# Patient Record
Sex: Female | Born: 1975 | Race: Black or African American | Hispanic: No | Marital: Single | Smoking: Current every day smoker
Health system: Southern US, Community
[De-identification: ages and names within clinical notes are randomized; demographics above are authoritative.]

## PROBLEM LIST (undated history)

## (undated) DIAGNOSIS — I519 Heart disease, unspecified: Secondary | ICD-10-CM

## (undated) DIAGNOSIS — G4700 Insomnia, unspecified: Secondary | ICD-10-CM

## (undated) DIAGNOSIS — J9690 Respiratory failure, unspecified, unspecified whether with hypoxia or hypercapnia: Secondary | ICD-10-CM

## (undated) DIAGNOSIS — H539 Unspecified visual disturbance: Secondary | ICD-10-CM

## (undated) DIAGNOSIS — R011 Cardiac murmur, unspecified: Secondary | ICD-10-CM

## (undated) DIAGNOSIS — F329 Major depressive disorder, single episode, unspecified: Secondary | ICD-10-CM

## (undated) DIAGNOSIS — F32A Depression, unspecified: Secondary | ICD-10-CM

## (undated) DIAGNOSIS — I10 Essential (primary) hypertension: Secondary | ICD-10-CM

## (undated) DIAGNOSIS — D509 Iron deficiency anemia, unspecified: Secondary | ICD-10-CM

## (undated) HISTORY — DX: Unspecified visual disturbance: H53.9

## (undated) HISTORY — DX: Essential (primary) hypertension: I10

## (undated) HISTORY — DX: Cardiac murmur, unspecified: R01.1

## (undated) HISTORY — DX: Depression, unspecified: F32.A

## (undated) HISTORY — DX: Major depressive disorder, single episode, unspecified: F32.9

## (undated) HISTORY — DX: Heart disease, unspecified: I51.9

## (undated) HISTORY — DX: Iron deficiency anemia, unspecified: D50.9

## (undated) HISTORY — DX: Respiratory failure, unspecified, unspecified whether with hypoxia or hypercapnia: J96.90

---

## 1995-01-09 HISTORY — PX: DILATION AND CURETTAGE OF UTERUS: SHX78

## 1997-02-22 ENCOUNTER — Inpatient Hospital Stay (HOSPITAL_COMMUNITY): Admission: AD | Admit: 1997-02-22 | Discharge: 1997-02-22 | Payer: Self-pay | Admitting: Obstetrics

## 1997-03-10 ENCOUNTER — Inpatient Hospital Stay (HOSPITAL_COMMUNITY): Admission: AD | Admit: 1997-03-10 | Discharge: 1997-03-14 | Payer: Self-pay | Admitting: Obstetrics

## 1999-02-02 ENCOUNTER — Inpatient Hospital Stay (HOSPITAL_COMMUNITY): Admission: AD | Admit: 1999-02-02 | Discharge: 1999-02-04 | Payer: Self-pay | Admitting: *Deleted

## 1999-02-28 ENCOUNTER — Ambulatory Visit (HOSPITAL_COMMUNITY): Admission: RE | Admit: 1999-02-28 | Discharge: 1999-02-28 | Payer: Self-pay | Admitting: *Deleted

## 1999-02-28 ENCOUNTER — Encounter: Payer: Self-pay | Admitting: *Deleted

## 1999-06-13 ENCOUNTER — Inpatient Hospital Stay (HOSPITAL_COMMUNITY): Admission: AD | Admit: 1999-06-13 | Discharge: 1999-06-15 | Payer: Self-pay | Admitting: *Deleted

## 1999-12-03 ENCOUNTER — Inpatient Hospital Stay (HOSPITAL_COMMUNITY): Admission: AD | Admit: 1999-12-03 | Discharge: 1999-12-03 | Payer: Self-pay | Admitting: *Deleted

## 2001-08-21 ENCOUNTER — Encounter: Admission: RE | Admit: 2001-08-21 | Discharge: 2001-08-21 | Payer: Self-pay | Admitting: *Deleted

## 2001-08-27 ENCOUNTER — Encounter: Admission: RE | Admit: 2001-08-27 | Discharge: 2001-08-27 | Payer: Self-pay | Admitting: *Deleted

## 2001-09-03 ENCOUNTER — Encounter: Admission: RE | Admit: 2001-09-03 | Discharge: 2001-09-03 | Payer: Self-pay | Admitting: *Deleted

## 2001-09-17 ENCOUNTER — Encounter: Admission: RE | Admit: 2001-09-17 | Discharge: 2001-09-17 | Payer: Self-pay | Admitting: *Deleted

## 2001-09-22 ENCOUNTER — Ambulatory Visit (HOSPITAL_COMMUNITY): Admission: RE | Admit: 2001-09-22 | Discharge: 2001-09-22 | Payer: Self-pay | Admitting: *Deleted

## 2001-10-08 ENCOUNTER — Encounter: Admission: RE | Admit: 2001-10-08 | Discharge: 2001-10-08 | Payer: Self-pay | Admitting: *Deleted

## 2001-10-22 ENCOUNTER — Encounter: Admission: RE | Admit: 2001-10-22 | Discharge: 2001-10-22 | Payer: Self-pay | Admitting: *Deleted

## 2001-11-19 ENCOUNTER — Inpatient Hospital Stay (HOSPITAL_COMMUNITY): Admission: AD | Admit: 2001-11-19 | Discharge: 2001-11-19 | Payer: Self-pay | Admitting: *Deleted

## 2001-11-20 ENCOUNTER — Inpatient Hospital Stay (HOSPITAL_COMMUNITY): Admission: AD | Admit: 2001-11-20 | Discharge: 2001-11-22 | Payer: Self-pay | Admitting: Family Medicine

## 2001-12-24 ENCOUNTER — Emergency Department (HOSPITAL_COMMUNITY): Admission: EM | Admit: 2001-12-24 | Discharge: 2001-12-24 | Payer: Self-pay | Admitting: Emergency Medicine

## 2002-06-05 ENCOUNTER — Emergency Department (HOSPITAL_COMMUNITY): Admission: EM | Admit: 2002-06-05 | Discharge: 2002-06-05 | Payer: Self-pay | Admitting: Emergency Medicine

## 2003-03-25 ENCOUNTER — Inpatient Hospital Stay (HOSPITAL_COMMUNITY): Admission: AD | Admit: 2003-03-25 | Discharge: 2003-03-25 | Payer: Self-pay | Admitting: Obstetrics and Gynecology

## 2003-03-25 ENCOUNTER — Encounter (INDEPENDENT_AMBULATORY_CARE_PROVIDER_SITE_OTHER): Payer: Self-pay | Admitting: Specialist

## 2003-06-01 ENCOUNTER — Inpatient Hospital Stay (HOSPITAL_COMMUNITY): Admission: AD | Admit: 2003-06-01 | Discharge: 2003-06-01 | Payer: Self-pay | Admitting: Obstetrics and Gynecology

## 2003-06-01 ENCOUNTER — Encounter: Admission: RE | Admit: 2003-06-01 | Discharge: 2003-06-01 | Payer: Self-pay | Admitting: Obstetrics and Gynecology

## 2003-06-17 ENCOUNTER — Inpatient Hospital Stay (HOSPITAL_COMMUNITY): Admission: AD | Admit: 2003-06-17 | Discharge: 2003-06-20 | Payer: Self-pay | Admitting: Obstetrics and Gynecology

## 2003-06-17 ENCOUNTER — Encounter (INDEPENDENT_AMBULATORY_CARE_PROVIDER_SITE_OTHER): Payer: Self-pay | Admitting: Specialist

## 2004-04-08 ENCOUNTER — Observation Stay (HOSPITAL_COMMUNITY): Admission: EM | Admit: 2004-04-08 | Discharge: 2004-04-09 | Payer: Self-pay | Admitting: Emergency Medicine

## 2004-04-08 ENCOUNTER — Ambulatory Visit: Payer: Self-pay | Admitting: Internal Medicine

## 2004-04-19 ENCOUNTER — Ambulatory Visit (HOSPITAL_COMMUNITY): Admission: RE | Admit: 2004-04-19 | Discharge: 2004-04-19 | Payer: Self-pay | Admitting: Internal Medicine

## 2004-04-19 ENCOUNTER — Encounter: Payer: Self-pay | Admitting: Cardiology

## 2004-04-19 ENCOUNTER — Ambulatory Visit: Payer: Self-pay | Admitting: Cardiology

## 2004-05-23 ENCOUNTER — Ambulatory Visit (HOSPITAL_COMMUNITY): Admission: RE | Admit: 2004-05-23 | Discharge: 2004-05-23 | Payer: Self-pay | Admitting: Obstetrics & Gynecology

## 2004-08-28 ENCOUNTER — Inpatient Hospital Stay (HOSPITAL_COMMUNITY): Admission: AD | Admit: 2004-08-28 | Discharge: 2004-08-30 | Payer: Self-pay | Admitting: *Deleted

## 2005-10-12 ENCOUNTER — Ambulatory Visit (HOSPITAL_COMMUNITY): Admission: RE | Admit: 2005-10-12 | Discharge: 2005-10-12 | Payer: Self-pay | Admitting: Obstetrics & Gynecology

## 2005-12-27 ENCOUNTER — Inpatient Hospital Stay (HOSPITAL_COMMUNITY): Admission: AD | Admit: 2005-12-27 | Discharge: 2005-12-27 | Payer: Self-pay | Admitting: Obstetrics & Gynecology

## 2006-01-14 ENCOUNTER — Inpatient Hospital Stay (HOSPITAL_COMMUNITY): Admission: AD | Admit: 2006-01-14 | Discharge: 2006-01-19 | Payer: Self-pay | Admitting: Obstetrics

## 2006-01-15 ENCOUNTER — Encounter: Payer: Self-pay | Admitting: Obstetrics & Gynecology

## 2007-06-10 ENCOUNTER — Emergency Department (HOSPITAL_COMMUNITY): Admission: EM | Admit: 2007-06-10 | Discharge: 2007-06-10 | Payer: Self-pay | Admitting: Emergency Medicine

## 2008-06-18 ENCOUNTER — Encounter: Payer: Self-pay | Admitting: Infectious Diseases

## 2008-06-18 ENCOUNTER — Ambulatory Visit: Payer: Self-pay | Admitting: Pulmonary Disease

## 2008-06-18 ENCOUNTER — Inpatient Hospital Stay (HOSPITAL_COMMUNITY): Admission: EM | Admit: 2008-06-18 | Discharge: 2008-06-25 | Payer: Self-pay | Admitting: Emergency Medicine

## 2008-06-18 ENCOUNTER — Ambulatory Visit: Payer: Self-pay | Admitting: Surgery

## 2008-06-18 DIAGNOSIS — J45902 Unspecified asthma with status asthmaticus: Secondary | ICD-10-CM | POA: Insufficient documentation

## 2008-06-20 ENCOUNTER — Encounter: Payer: Self-pay | Admitting: Infectious Diseases

## 2008-06-24 ENCOUNTER — Encounter: Payer: Self-pay | Admitting: Internal Medicine

## 2008-06-24 ENCOUNTER — Ambulatory Visit: Payer: Self-pay | Admitting: Infectious Diseases

## 2008-06-24 DIAGNOSIS — I509 Heart failure, unspecified: Secondary | ICD-10-CM

## 2008-06-24 DIAGNOSIS — J9621 Acute and chronic respiratory failure with hypoxia: Secondary | ICD-10-CM | POA: Insufficient documentation

## 2008-06-24 DIAGNOSIS — I10 Essential (primary) hypertension: Secondary | ICD-10-CM

## 2008-06-24 DIAGNOSIS — F1210 Cannabis abuse, uncomplicated: Secondary | ICD-10-CM | POA: Insufficient documentation

## 2008-06-24 DIAGNOSIS — D509 Iron deficiency anemia, unspecified: Secondary | ICD-10-CM

## 2008-06-24 DIAGNOSIS — J4530 Mild persistent asthma, uncomplicated: Secondary | ICD-10-CM

## 2008-06-28 ENCOUNTER — Ambulatory Visit: Payer: Self-pay | Admitting: Infectious Diseases

## 2008-07-06 ENCOUNTER — Emergency Department (HOSPITAL_COMMUNITY): Admission: EM | Admit: 2008-07-06 | Discharge: 2008-07-07 | Payer: Self-pay | Admitting: Emergency Medicine

## 2008-07-09 ENCOUNTER — Emergency Department (HOSPITAL_COMMUNITY): Admission: EM | Admit: 2008-07-09 | Discharge: 2008-07-09 | Payer: Self-pay | Admitting: Emergency Medicine

## 2008-07-13 ENCOUNTER — Encounter: Payer: Self-pay | Admitting: Internal Medicine

## 2008-07-13 ENCOUNTER — Ambulatory Visit: Payer: Self-pay | Admitting: Internal Medicine

## 2008-07-13 DIAGNOSIS — H539 Unspecified visual disturbance: Secondary | ICD-10-CM

## 2008-07-15 ENCOUNTER — Telehealth: Payer: Self-pay | Admitting: *Deleted

## 2008-07-22 ENCOUNTER — Encounter: Payer: Self-pay | Admitting: Internal Medicine

## 2008-07-24 ENCOUNTER — Emergency Department (HOSPITAL_COMMUNITY): Admission: EM | Admit: 2008-07-24 | Discharge: 2008-07-24 | Payer: Self-pay | Admitting: Emergency Medicine

## 2008-07-24 ENCOUNTER — Telehealth (INDEPENDENT_AMBULATORY_CARE_PROVIDER_SITE_OTHER): Payer: Self-pay | Admitting: Internal Medicine

## 2008-07-26 ENCOUNTER — Encounter: Payer: Self-pay | Admitting: Internal Medicine

## 2008-07-27 ENCOUNTER — Telehealth: Payer: Self-pay | Admitting: Infectious Diseases

## 2008-11-19 ENCOUNTER — Emergency Department (HOSPITAL_COMMUNITY): Admission: EM | Admit: 2008-11-19 | Discharge: 2008-11-19 | Payer: Self-pay | Admitting: Emergency Medicine

## 2009-01-10 ENCOUNTER — Telehealth: Payer: Self-pay | Admitting: *Deleted

## 2009-05-12 ENCOUNTER — Encounter: Payer: Self-pay | Admitting: Internal Medicine

## 2009-05-12 ENCOUNTER — Ambulatory Visit: Payer: Self-pay | Admitting: Internal Medicine

## 2009-05-12 LAB — CONVERTED CEMR LAB
Albumin: 4.6 g/dL (ref 3.5–5.2)
BUN: 11 mg/dL (ref 6–23)
Basophils Absolute: 0 10*3/uL (ref 0.0–0.1)
Calcium: 9.6 mg/dL (ref 8.4–10.5)
Chloride: 102 meq/L (ref 96–112)
Glucose, Bld: 73 mg/dL (ref 70–99)
Hemoglobin: 11.9 g/dL — ABNORMAL LOW (ref 12.0–15.0)
Lymphocytes Relative: 41 % (ref 12–46)
Monocytes Absolute: 0.5 10*3/uL (ref 0.1–1.0)
Neutro Abs: 3.4 10*3/uL (ref 1.7–7.7)
Neutrophils Relative %: 51 % (ref 43–77)
Potassium: 3.6 meq/L (ref 3.5–5.3)
Preg, Serum: NEGATIVE
RBC: 4.02 M/uL (ref 3.87–5.11)
RDW: 16 % — ABNORMAL HIGH (ref 11.5–15.5)
Total Protein: 7.5 g/dL (ref 6.0–8.3)

## 2009-05-13 ENCOUNTER — Ambulatory Visit: Payer: Self-pay | Admitting: Internal Medicine

## 2009-05-19 ENCOUNTER — Encounter: Payer: Self-pay | Admitting: Internal Medicine

## 2009-05-19 ENCOUNTER — Ambulatory Visit (HOSPITAL_COMMUNITY)
Admission: RE | Admit: 2009-05-19 | Discharge: 2009-05-19 | Payer: Self-pay | Source: Home / Self Care | Admitting: Internal Medicine

## 2009-05-23 ENCOUNTER — Telehealth: Payer: Self-pay | Admitting: *Deleted

## 2009-06-03 ENCOUNTER — Ambulatory Visit: Payer: Self-pay | Admitting: Cardiovascular Disease

## 2009-06-03 DIAGNOSIS — R079 Chest pain, unspecified: Secondary | ICD-10-CM

## 2009-06-09 ENCOUNTER — Telehealth (INDEPENDENT_AMBULATORY_CARE_PROVIDER_SITE_OTHER): Payer: Self-pay | Admitting: *Deleted

## 2009-06-13 ENCOUNTER — Ambulatory Visit: Payer: Self-pay | Admitting: Internal Medicine

## 2009-06-13 ENCOUNTER — Encounter (HOSPITAL_COMMUNITY)
Admission: RE | Admit: 2009-06-13 | Discharge: 2009-08-09 | Payer: Self-pay | Source: Home / Self Care | Admitting: Cardiovascular Disease

## 2009-06-13 ENCOUNTER — Encounter: Payer: Self-pay | Admitting: Internal Medicine

## 2009-06-13 ENCOUNTER — Ambulatory Visit: Payer: Self-pay

## 2009-06-13 DIAGNOSIS — F17200 Nicotine dependence, unspecified, uncomplicated: Secondary | ICD-10-CM | POA: Insufficient documentation

## 2009-06-27 ENCOUNTER — Ambulatory Visit: Payer: Self-pay | Admitting: Internal Medicine

## 2009-06-30 ENCOUNTER — Ambulatory Visit: Payer: Self-pay | Admitting: Internal Medicine

## 2009-07-26 ENCOUNTER — Telehealth: Payer: Self-pay | Admitting: Internal Medicine

## 2009-07-29 ENCOUNTER — Ambulatory Visit: Payer: Self-pay | Admitting: Internal Medicine

## 2009-08-04 ENCOUNTER — Ambulatory Visit: Payer: Self-pay | Admitting: Internal Medicine

## 2009-08-04 DIAGNOSIS — K219 Gastro-esophageal reflux disease without esophagitis: Secondary | ICD-10-CM

## 2009-08-04 LAB — CONVERTED CEMR LAB
ALT: 21 units/L (ref 0–35)
AST: 25 units/L (ref 0–37)
Alkaline Phosphatase: 60 units/L (ref 39–117)
Basophils Absolute: 0 10*3/uL (ref 0.0–0.1)
Basophils Relative: 0 % (ref 0–1)
CO2: 22 meq/L (ref 19–32)
Creatinine, Ser: 0.84 mg/dL (ref 0.40–1.20)
Eosinophils Absolute: 0.1 10*3/uL (ref 0.0–0.7)
Eosinophils Relative: 1 % (ref 0–5)
HCT: 37.6 % (ref 36.0–46.0)
MCHC: 34.3 g/dL (ref 30.0–36.0)
MCV: 85.5 fL (ref >=78.0)
Neutrophils Relative %: 69 % (ref 43–77)
Platelets: 169 10*3/uL (ref 150–400)
RDW: 20.2 % — ABNORMAL HIGH (ref 11.5–15.5)
Sodium: 139 meq/L (ref 135–145)
Total Bilirubin: 0.5 mg/dL (ref 0.3–1.2)
Total Protein: 7.3 g/dL (ref 6.0–8.3)
WBC: 8.4 10*3/uL (ref 4.0–10.5)

## 2009-08-09 ENCOUNTER — Telehealth: Payer: Self-pay | Admitting: Internal Medicine

## 2009-08-18 ENCOUNTER — Encounter: Payer: Self-pay | Admitting: Internal Medicine

## 2009-08-22 ENCOUNTER — Telehealth (INDEPENDENT_AMBULATORY_CARE_PROVIDER_SITE_OTHER): Payer: Self-pay | Admitting: *Deleted

## 2009-08-29 ENCOUNTER — Telehealth: Payer: Self-pay | Admitting: Internal Medicine

## 2009-09-08 ENCOUNTER — Telehealth (INDEPENDENT_AMBULATORY_CARE_PROVIDER_SITE_OTHER): Payer: Self-pay | Admitting: *Deleted

## 2009-09-09 ENCOUNTER — Ambulatory Visit: Payer: Self-pay | Admitting: Internal Medicine

## 2009-09-11 ENCOUNTER — Encounter: Payer: Self-pay | Admitting: Internal Medicine

## 2009-10-07 ENCOUNTER — Ambulatory Visit: Payer: Self-pay | Admitting: Internal Medicine

## 2009-10-07 ENCOUNTER — Ambulatory Visit (HOSPITAL_COMMUNITY): Admission: RE | Admit: 2009-10-07 | Discharge: 2009-10-07 | Payer: Self-pay | Admitting: Internal Medicine

## 2009-10-07 DIAGNOSIS — M549 Dorsalgia, unspecified: Secondary | ICD-10-CM

## 2009-10-26 ENCOUNTER — Ambulatory Visit: Payer: Self-pay | Admitting: Internal Medicine

## 2010-01-19 ENCOUNTER — Ambulatory Visit: Admission: RE | Admit: 2010-01-19 | Discharge: 2010-01-19 | Payer: Self-pay | Source: Home / Self Care

## 2010-01-29 ENCOUNTER — Encounter: Payer: Self-pay | Admitting: Obstetrics & Gynecology

## 2010-02-07 NOTE — Progress Notes (Signed)
Summary: phone/gg  Phone Note Call from Patient   Summary of Call: Pt called and states she went for her appointment at GI with  Dr Evette Cristal  and they wanted a co-pay of $3 and there was a balance of $3 on her account.  She can't pay the bill so did not keep appointment.  Initial call taken by: Merrie Roof RN,  August 29, 2009 12:28 PM  Follow-up for Phone Call        She is scheduled to see me next month. Will follow up on her complaints. Follow-up by: [name] MD,  August 30, 2009 11:23 AM

## 2010-02-07 NOTE — Assessment & Plan Note (Signed)
Summary: Pulmonary/ f/u ? asthma   Copy to:  Dr. Eden Emms Primary Provider/Referring Provider:  [name] MD  CC:  3 wk followup.  Pt states that her breathing has been overall okay unless exposed to hot/humid weather.  She c/o aches in chest and arms x 1 wk.  She also has had fever and night sweats x 1 wk.  Cut back smoking to 2-3 cigs per day.Marland Kitchen  History of Present Illness: 33yobf smoker with h/o respiratory requiring life support at [facility] summer 2010  June 13, 2009 cc gen anterior postional cp off on and on for a year worse for sev months seems better sleeping typically doesn't wake up with it, worse with all activities esp rubbing chest wall.  no cough, on advair twice daily and still having trouble walking at the mall despite daily ventolin use.  minimal cough.   June 27, 2009--Presents for follow up and med review. Seen 2 weeks for atypical chest pain. Xray w/ no acute changes. recent cardiolite w/ no ischemia noted. 2 weeks ago. She was placed on GERD prevention and ACE inhibitor stopped. She was placed on Diovan. It appears after we discussed her meds today, she is also on Cozaar. We reveiewed her meds today and organized them into a med calendar. She  has not seen any improvment in her symptoms as of yet. Blood pressure is doing well since last visit. She does have low EF post critical illness in 2010 f/by cardiology.  July 29, 2009 3 wk followup.  Pt states that her breathing has been overall okay unless exposed to hot/humid weather.  She c/o aches in chest and arms x 1 wk.  She also has had fever and night sweats x 1 wk.  Cut back smoking to 2-3 cigs per day. much more limited by orthopedic issues than doe, no cough.  Pt denies any significant sore throat, dysphagia, itching, sneezing,  nasal congestion or excess secretions,  fever, chills, sweats, unintended wt loss, pleuritic or exertional cp, hempoptysis, change in activity tolerance  orthopnea pnd or leg swelling   Current Medications  (verified): 1)  Cymbalta 30 Mg Cpep (Duloxetine Hcl) .... 3 Capsules By Mouth  Every Morning 2)  Seroquel 100 Mg Tabs (Quetiapine Fumarate) .... 1/2 Tablet Once Daily At Bed Time 3)  Iron 325 (65 Fe) Mg Tabs (Ferrous Sulfate) .... Take 1 Tablet By Mouth Three Times A Day 4)  Dexilant 60 Mg Cpdr (Dexlansoprazole) .... One Take  One 30-60 Min Before First Meal of The Day 5)  Cozaar 25 Mg Tabs (Losartan Potassium) .... Take 1 Tablet By Mouth Once A Day 6)  Hair/skin/nails  Tabs (Multiple Vitamins-Minerals) .... Take 1 Tablet By Mouth Once A Day 7)  Advair Diskus 250-50 Mcg/dose Misc (Fluticasone-Salmeterol) .... Inhale 1 Puff Twice Daily As Directed 8)  Depo-Provera 150 Mg/ml Susp (Medroxyprogesterone Acetate) .... Intramuscular Injection Every 3 Months. 9)  Pepcid Ac Maximum Strength 20 Mg Tabs (Famotidine) .... One At Bedtime 10)  Cyclobenzaprine Hcl 10 Mg Tabs (Cyclobenzaprine Hcl) .Marland Kitchen.. 1 Pill Once Daily At Bed Time As Needed For Muscle Cramps. 11)  Ibuprofen 800 Mg Tabs (Ibuprofen) .... Take 1 Tablet By Mouth Every 8 Hours As Needed 12)  Ventolin Hfa 108 (90 Base) Mcg/act Aers (Albuterol Sulfate) .... 2 Puffs Inhaled Every 4-6 Hours As Needed For Shortness of Breath/ Wheezing 13)  Zyrtec Allergy 10 Mg Tabs (Cetirizine Hcl) .... Take 1 Tab By Mouth At Bedtime As Needed  Allergies (verified): 1)  ! *  Peanuts  Past History:  Past Medical History: heart murmur  LEFT VENTRICULAR FUNCTION, DECREASED    -Low EF 20 -25%, in the setting of severe illness requring ICU care 06/2008   - cardiolyte June 13, 2009 Normal perfusion without significant ischemia or scar.   LVEF 38%.   VDRF, 2/2 status asthmaticus 6/11-18/2010 admit [facility] HTN    - Try off ACE June 13, 2009  VISION DISORDER ANEMIA, IRON DEFICIENCY ASTHMA vs COPD............................................................Marland KitchenWert      - Trial off ace with f/u pft's rec June 13, 2009  > no worse Complex med regimen---. Meds reviewed with pt  education and computerized med calendar completed June 27, 2009   Vital Signs:  Patient profile:   35 year old female Weight:      166 pounds O2 Sat:      96 % on Room air Temp:     98.1 degrees F oral Pulse rate:   132 / minute BP sitting:   118 / 88  (left arm)  Vitals Entered By: Vernie Murders (July 29, 2009 4:32 PM)  O2 Flow:  Room air  Physical Exam  Additional Exam:  wt 163 > 161 June 13, 2009 >>160 June 27, 2009 > 166 July 30, 2009 amb wf can barely get out of a chair without sob HEENT: nl dentition, turbinates, and orophanx. Nl external ear canals without cough reflex NECK :  without JVD/Nodes/TM/ nl carotid upstrokes bilaterally LUNGS: no acc muscle use, clear to A and P bilaterally without cough on insp or exp maneuvers CV:  RRR  no s3 or murmur or increase in P2, no edema  ABD:  soft and nontender with nl excursion in the supine position. No bruits or organomegaly, bowel sounds nl MS:  warm without deformities, calf tenderness, cyanosis or clubbing    Impression & Recommendations:  Problem # 1:  ASTHMA (ICD-493.90)   DDX of  difficult airways managment all start with A and  include Adherence, Ace Inhibitors, Acid Reflux, Active Sinus Disease, Alpha 1 Antitripsin deficiency, Anxiety masquerading as Airways dz,  ABPA,  allergy(esp in young), Aspiration (esp in elderly), Adverse effects of DPI,  Active smokers, plus one B  = Beta blocker use..   Ace now off Active smoking still an issue.  Problem # 2:  SMOKER (ICD-305.1)   I emphasized that although we never turn away smokers from the pulmonary clinic, we do ask that they understand that the recommendations that were made won't work nearly as well in the presence of continued cigarette exposure and we may reach a point where we can't help the patient if he/she can't quit smoking.    I took this opportunity to emphasize  the consequences of smoking in airway disorders based on all the data we have from the multiple  national lung health studies indicating that smoking cessation, not choice of inhalers or physicians, is the most important aspect of care.    Orders: Est. Patient Level IV (98119)  Problem # 3:  HYPERTENSION, BENIGN (ICD-401.1)  Her updated medication list for this problem includes:    Cozaar 25 Mg Tabs (Losartan potassium) .Marland Kitchen... Take 1 tablet by mouth once a day  ok off ace   ACE inhibitors are problematic in  pts with airway complaints because  even experienced pulmonologists can't always distinguish ace effects from copd/asthma.  By themselves they don't actually cause a problem, much like oxygen can't by itself start a fire, but they certainly serve  as a powerful catalyst or enhancer for any "fire"  or inflammatory process in the upper airway, be it caused by an ET  tube or more commonly reflux (especially in the obese or pts with known GERD or who are on biphoshonates).  In the era of ARB near equivalency until we have a better handle on the reversibility of the airway problem, it just makes sense to avoid ace entirely in the short run and then decide later, having established a level of airway control using a reasonable limited regimen, whether to add back ace but even then being very careful to observe the pt for worsening airway control and number of meds used/ needed to control symptoms.    Patient Instructions: 1)  Please schedule a follow-up appointment in 6 weeks, sooner if needed with PFTs

## 2010-02-07 NOTE — Assessment & Plan Note (Signed)
Summary: Pulmonary/ atypical asthma/ cp/ try off ACE    Visit Type:  Initial Consult Copy to:  Dr. Eden Emms Primary Provider/Referring Provider:  [name] MD  CC:  Chest Pain.  History of Present Illness: 33yobf smoker with h/o respiratory  requiring life support at [facility] summer 2010  June 13, 2009 cc gen anterior postional cp off on and on for a year worse for sev months seems better sleeping typically doesn't wake up with it, worse with all activities esp rubbing chest wall.  no cough, on advair twice daily and still having trouble walking at the mall despite daily ventolin use.  minimal cough. Pt denies any significant sore throat, dysphagia, itching, sneezing,  nasal congestion or excess secretions,  fever, chills, sweats, unintended wt loss,  classic lateralizing pleuritic or exertional cp, hempoptysis, change in activity tolerance  orthopnea pnd or leg swelling.  Pt also denies any obvious fluctuation in symptoms with weather or environmental change or other alleviating or aggravating factors.       Preventive Screening-Counseling & Management  Alcohol-Tobacco     Smoking Status: current     Smoking Cessation Counseling: yes  Current Medications (verified): 1)  Advair Diskus 250-50 Mcg/dose Misc (Fluticasone-Salmeterol) .... Inhale 1 Puff Twice Daily As Directed 2)  Ventolin Hfa 108 (90 Base) Mcg/act Aers (Albuterol Sulfate) .... 2 Puffs Inhaled Every 4-6 Hours As Needed For Shortness of Breath/ Wheezing 3)  Iron 325 (65 Fe) Mg Tabs (Ferrous Sulfate) .... Take 1 Tablet By Mouth Three Times A Day 4)  Seroquel 100 Mg Tabs (Quetiapine Fumarate) .... Half To 1 Tablet Once Daily At Bed Time 5)  Cymbalta 60 Mg Cpep (Duloxetine Hcl) .... Take 1 Tablet By Mouth Once A Day 6)  Cyclobenzaprine Hcl 10 Mg Tabs (Cyclobenzaprine Hcl) .Marland Kitchen.. 1 Pill Once Daily At Bed Time As Needed For Muscle Cramps. 7)  Depo-Provera 150 Mg/ml Susp (Medroxyprogesterone Acetate) .... Intramuscular Injection Every 3  Months. 8)  Cvs Sinus Pain/congestion Day 5-325 Mg Tabs (Phenylephrine-Acetaminophen) .... As Directed As Needed 9)  Gnp Hair/skin/nails  Tabs (Multiple Vitamins-Minerals) .Marland Kitchen.. 1 Three Times A Day 10)  Ferrous Sulfate 325 (65 Fe) Mg Tabs (Ferrous Sulfate) .Marland Kitchen.. 1 Three Times A Day 11)  Ibuprofen 600 Mg Tabs (Ibuprofen) .Marland Kitchen.. 1 Three Times A Day With Meals 12)  Lisinopril 10 Mg Tabs (Lisinopril) .Marland Kitchen.. 1 Once Daily 13)  Claritin 10 Mg Tabs (Loratadine) .Marland Kitchen.. 1 Once Daily  Allergies (verified): 1)  ! * Peanuts  Past History:  Past Medical History: heart murmur  LEFT VENTRICULAR FUNCTION, DECREASED    -Low EF 20 -25%, in the setting of severe illness requring ICU care 06/2008   - cardiolyte June 13, 2009 Normal perfusion without significant ischemia or scar.   LVEF 38%.   VDRF, 2/2 status asthmaticus 6/11-18/2010 admit [facility] HTN    - Try off ACE June 13, 2009  VISION DISORDER ANEMIA, IRON DEFICIENCY ASTHMA vs COPD............................................................Marland KitchenWert      - Trial off ace with f/u pft's rec June 13, 2009   Social History: Currently not working lives with her boy friend in [place] 5 kids - 3 boys and 2 girls. Current smoker since age 62.  Smoked 1/2 ppd. ETOH occ Smoking Status:  current  Review of Systems       The patient complains of shortness of breath with activity, shortness of breath at rest, chest pain, loss of appetite, abdominal pain, headaches, nasal congestion/difficulty breathing through nose, anxiety, depression, hand/feet  swelling, joint stiffness or pain, rash, and change in color of mucus.  The patient denies productive cough, non-productive cough, coughing up blood, irregular heartbeats, acid heartburn, indigestion, weight change, difficulty swallowing, sore throat, tooth/dental problems, sneezing, itching, ear ache, and fever.    Vital Signs:  Patient profile:   35 year old female Weight:      161 pounds O2 Sat:      98 % on Room air Temp:      98.8 degrees F oral Pulse rate:   109 / minute BP sitting:   108 / 66  (left arm)  Vitals Entered By: Vernie Murders (June 13, 2009 10:41 AM)  O2 Flow:  Room air  Physical Exam  Additional Exam:  wt 163 > 161 June 13, 2009  HEENT: nl dentition, turbinates, and orophanx. Nl external ear canals without cough reflex NECK :  without JVD/Nodes/TM/ nl carotid upstrokes bilaterally LUNGS: no acc muscle use, clear to A and P bilaterally without cough on insp or exp maneuvers CV:  RRR  no s3 or murmur or increase in P2, no edema  ABD:  soft and nontender with nl excursion in the supine position. No bruits or organomegaly, bowel sounds nl MS:  warm without deformities, calf tenderness, cyanosis or clubbing SKIN: warm and dry without lesions   NEURO:  alert, approp, no deficits     CXR  Procedure date:  11/19/2008  Findings:      wnl, no findings of copd  Impression & Recommendations:  Problem # 1:  ASTHMA (ICD-493.90) DDX of  difficult airways managment all start with A and  include Adherence, Ace Inhibitors, Acid Reflux, Active Sinus Disease, Alpha 1 Antitripsin deficiency, Anxiety masquerading as Airways dz,  ABPA,  allergy(esp in young), Aspiration (esp in elderly), Adverse effects of DPI,  Active smokers, plus one B  = Beta blocker use..    Symptoms are markedly disproportionate to findings and strongly suggests an adverse drug effect, either from ace or Advair, and since it's so simple to replace ace with arb for a trial basis this is the most reasonable first step.  Active smoking also discussed.   Each maintenance medication was reviewed in detail including most importantly the difference between maintenance and as needed and under what circumstances the prns are to be used.  To keep things simple, I have asked the patient to first separate medicines that are perceived as maintenance, that is to be taken daily "no matter what", from those medicines that are taken on only on an  as-needed basis and I have given the patient examples of both, and then return to see our NP to generate a  detailed  medication calendar which should be followed until the next physician sees the patient and updates it.   Once we're sure that we're all reading from the same page in terms of medication admiistration, she needs to be scheduled to follow up with me with pft's.  Problem # 2:  LEFT VENTRICULAR FUNCTION, DECREASED (ICD-429.2)  The following medications were removed from the medication list:    Losartan Potassium 25 Mg Tabs (Losartan potassium) ..... One tablet by mouth once daily    Lisinopril 10 Mg Tabs (Lisinopril) .Marland Kitchen... 1 once daily Her updated medication list for this problem includes:    Diovan 80 Mg Tabs (Valsartan) ..... One daily   ACE inhibitors are problematic in  pts with airway complaints because  even experienced pulmonologists can't always distinguish ace effects from copd/asthma.  By themselves they don't actually cause a problem, much like oxygen can't by itself start a fire, but they certainly serve as a powerful catalyst or enhancer for any "fire"  or inflammatory process in the upper airway, be it caused by an ET  tube or more commonly reflux (especially in the obese or pts with known GERD or who are on biphoshonates).  In the era of ARB near equivalency until we have a better handle on the reversibility of the airway problem, it just makes sense to avoid ace entirely in the short run and then decide later, having established a level of airway control using a reasonable limited regimen, whether to add back ace but even then being very careful to observe the pt for worsening airway control and number of meds used/ needed to control symptoms.    Problem # 3:  SMOKER (ICD-305.1) > 3 min discussion  Discussed but not ready to committ to quit at this point - emphasized risks involved in continuing smoking and that patient should consider these in the context of the cost of  smoking relative to the benefit obtained.    I took this opportunity to educate the patient regarding the consequences of smoking in airway disorders based on all the data we have from the multiple national lung health studies indicating that smoking cessation, not choice of inhalers or physicians, is the most important aspect of care.  Medications Added to Medication List This Visit: 1)  Cvs Sinus Pain/congestion Day 5-325 Mg Tabs (Phenylephrine-acetaminophen) .... As directed as needed 2)  Gnp Hair/skin/nails Tabs (Multiple vitamins-minerals) .Marland Kitchen.. 1 three times a day 3)  Ferrous Sulfate 325 (65 Fe) Mg Tabs (Ferrous sulfate) .Marland Kitchen.. 1 three times a day 4)  Ibuprofen 600 Mg Tabs (Ibuprofen) .Marland Kitchen.. 1 three times a day with meals 5)  Lisinopril 10 Mg Tabs (Lisinopril) .Marland Kitchen.. 1 once daily 6)  Claritin 10 Mg Tabs (Loratadine) .Marland Kitchen.. 1 once daily 7)  Diovan 80 Mg Tabs (Valsartan) .... One daily 8)  Dexilant 60 Mg Cpdr (Dexlansoprazole) .... One take  one 30-60 min before first meal of the day 9)  Pepcid Ac Maximum Strength 20 Mg Tabs (Famotidine) .... One at bedtime  Other Orders: New Patient Level V (91478) Tobacco use cessation intermediate 3-10 minutes (29562)  Patient Instructions: 1)  GERD (REFLUX)  is a common cause of respiratory symptoms. It commonly presents without heartburn and can be treated with medication, but also with lifestyle changes including avoidance of late meals, excessive alcohol, smoking cessation, and avoid fatty foods, chocolate, peppermint, colas, red wine, and acidic juices such as orange juice. NO MINT OR MENTHOL PRODUCTS SO NO COUGH DROPS  2)  USE SUGARLESS CANDY INSTEAD (jolley ranchers)  3)  NO OIL BASED VITAMINS  4)  stop lisnopril 5)  start diovan 80 mg one daily 6)  Add dexilant Take  one 30-60 min before first meal of the day and pecid 20 mg one at bedtime 7)  See Tammy NP w/in 2 weeks with all your medications, even over the counter meds, separated in two separate  bags, the ones you take no matter what vs the ones you stop once you feel better and take only as needed.  She will generate for you a new user friendly medication calendar that will put [place] all on the same page re: your medication use.  8)  Committ to quit smoking    CXR  Procedure date:  11/19/2008  Findings:  wnl, no findings of copd

## 2010-02-07 NOTE — Miscellaneous (Signed)
Summary: Orders Update-pft charges//jwr  Clinical Lists Changes  Orders: Added new Service order of Carbon Monoxide diffusing w/capacity (94720) - Signed Added new Service order of Lung Volumes (94240) - Signed Added new Service order of Spirometry (Pre & Post) (94060) - Signed 

## 2010-02-07 NOTE — Assessment & Plan Note (Signed)
Summary: cont chest pain, acid reflux/pcp-boggala/hla   Vital Signs:  Patient profile:   35 year old female Weight:      170.6 pounds (77.55 kg) Temp:     97.6 degrees F (36.44 degrees C) oral Pulse rate:   89 / minute BP sitting:   137 / 86  (right arm)  Vitals Entered By: Stanton Kidney Ditzler RN (August 04, 2009 10:07 AM) Comments Pt comes to clinic 9:30AM for DeprProvera injection - Dr Tonny Branch talked with Dr Meredith Pel - will talk with pt only.   Primary Care Provider:  [name] MD   History of Present Illness: Patient is a 35 year old female who presents today with a complaint of epigastric and chest pain.  The pain has some correlation with meals and bed time but can happen throughout the day.  She does sometimes regurgitate undigested food at night.  She smokes 1/2 pack daily and drinks alcohol occasionally.  She also drinks 1-2 caffiene drinks per day.  She has noticed that fried foods make the pain worse.  She denies any coughing up blood, diarrhea, constipation, or blood in the stool.  She has noticed that her stools are darker sometimes but not sticky.  She also uses 800 mg of ibuprofen three times a day for back pain. She has a prescription for pepcid once daily at night and that seems to help a little.   Preventive Screening-Counseling & Management  Alcohol-Tobacco     Alcohol drinks/day: 1     Smoking Status: current     Smoking Cessation Counseling: yes     Packs/Day: 0.5  Caffeine-Diet-Exercise     Caffeine use/day: 1-2 drinks per day  Current Medications (verified): 1)  Cymbalta 30 Mg Cpep (Duloxetine Hcl) .... 3 Capsules By Mouth  Every Morning 2)  Seroquel 100 Mg Tabs (Quetiapine Fumarate) .... 1/2 Tablet Once Daily At Bed Time 3)  Iron 325 (65 Fe) Mg Tabs (Ferrous Sulfate) .... Take 1 Tablet By Mouth Three Times A Day 4)  Dexilant 60 Mg Cpdr (Dexlansoprazole) .... One Take  One 30-60 Min Before First Meal of The Day 5)  Cozaar 25 Mg Tabs (Losartan Potassium) .... Take  1 Tablet By Mouth Once A Day 6)  Hair/skin/nails  Tabs (Multiple Vitamins-Minerals) .... Take 1 Tablet By Mouth Once A Day 7)  Advair Diskus 250-50 Mcg/dose Misc (Fluticasone-Salmeterol) .... Inhale 1 Puff Twice Daily As Directed 8)  Depo-Provera 150 Mg/ml Susp (Medroxyprogesterone Acetate) .... Intramuscular Injection Every 3 Months. 9)  Pepcid Ac Maximum Strength 20 Mg Tabs (Famotidine) .... One At Bedtime 10)  Cyclobenzaprine Hcl 10 Mg Tabs (Cyclobenzaprine Hcl) .Marland Kitchen.. 1 Pill Once Daily At Bed Time As Needed For Muscle Cramps. 11)  Ibuprofen 800 Mg Tabs (Ibuprofen) .... Take 1 Tablet By Mouth Every 8 Hours As Needed 12)  Ventolin Hfa 108 (90 Base) Mcg/act Aers (Albuterol Sulfate) .... 2 Puffs Inhaled Every 4-6 Hours As Needed For Shortness of Breath/ Wheezing 13)  Zyrtec Allergy 10 Mg Tabs (Cetirizine Hcl) .... Take 1 Tab By Mouth At Bedtime As Needed  Allergies (verified): 1)  ! * Peanuts  Past History:  Past medical, surgical, family and social histories (including risk factors) reviewed for relevance to current acute and chronic problems.  Past Medical History: Reviewed history from 07/29/2009 and no changes required. heart murmur  LEFT VENTRICULAR FUNCTION, DECREASED    -Low EF 20 -25%, in the setting of severe illness requring ICU care 06/2008   - cardiolyte June 13, 2009 Normal perfusion without significant ischemia or scar.   LVEF 38%.   VDRF, 2/2 status asthmaticus 6/11-18/2010 admit [facility] HTN    - Try off ACE June 13, 2009  VISION DISORDER ANEMIA, IRON DEFICIENCY ASTHMA vs COPD............................................................Marland KitchenWert      - Trial off ace with f/u pft's rec June 13, 2009  > no worse Complex med regimen---. Meds reviewed with pt education and computerized med calendar completed June 27, 2009   Past Surgical History: Reviewed history from 05/31/2009 and no changes required. dilation and curettage   Family History: Reviewed history from 07/13/2008 and  no changes required. Mom 14's - DM and ?cancer Dad- DONT KNOW ANYTHING 2 brothers- youngest brother (20's) - type 2 DM oldest brother - healthy  Social History: Reviewed history from 06/13/2009 and no changes required. Currently not working lives with her boy friend in [place] 5 kids - 3 boys and 2 girls. Current smoker since age 85.  Smoked 1/2 ppd. ETOH occ Packs/Day:  0.5 Caffeine use/day:  1-2 drinks per day  Review of Systems      See HPI  Physical Exam  Additional Exam:  General: Well developed, female in no acute distress.  Vital signs reviewed.  Head: Atraumatic, normocephalic with no signs of trauma  Eyes: PERRLA, EOM intact, no pallor noted  Ears: TM intact  Nose: Nares patent, mucosa is pink and moist, no polyps noted  Mouth: Mucosa is pink and moist, good dention,   Neck: Supple, full ROM, no thyromegaly or masses noted  Resp: Clear to ascultation bilaterally, no wheezes, rales, or rhonchi noted  CV: Regular rate and rhythm with no murmurs, rubs, or gallops noted, no pain to palpation over the chest.  Abdomen: Soft, non-distended with normal bowel sounds.  Mild tenderness to palpation in the epigastric area.   Musculoskelatal: ROM full with intact strength, no pain to palpation  Pulses: Radial, brachial, carotid, femoral, dorsal pedis, and posterior tibial pulses equal and symmetric     Impression & Recommendations:  Problem # 1:  ESOPHAGEAL REFLUX (ICD-530.81) We will stop her ibuprofen and start her on tylenol for her pain because of the stomach pain.  We will also continue her Dexilant and increase her Pepcid to two times a day.  I also encouraged her to stop smoking and avoid alcohol, caffiene, and fried foods.  We will schedule her for an appointment with GI for a possible EGD since we have essentially ruled out cardiac causes of her chest pain. We will see her back in 1 month for follow up with her PCP.   Her updated medication list for this problem includes:     Dexilant 60 Mg Cpdr (Dexlansoprazole) ..... One take  one 30-60 min before first meal of the day    Pepcid Ac Maximum Strength 20 Mg Tabs (Famotidine) .Marland Kitchen... Take 1 tablet by mouth two times a day for heartburn  Labs Reviewed: Hgb: 11.9 (05/12/2009)   Hct: 35.6 (05/12/2009)  Orders: Gastroenterology Referral (GI) T-CMP with Estimated GFR ([address]) T-CBC w/Diff ([id number])  Problem # 2:  CONTRACEPTIVE MANAGEMENT (ICD-V25.09)  She will get her depot shot today in clinic.  She is exactly on time so a pregnancy test is not necessary.  Orders: Admin of Therapeutic Inj  intramuscular or subcutaneous (27253) Depo-Provera 150mg  (J1055)  Complete Medication List: 1)  Cymbalta 30 Mg Cpep (Duloxetine hcl) .... 3 capsules by mouth  every morning 2)  Seroquel 100 Mg Tabs (Quetiapine fumarate) .... 1/2 tablet once  daily at bed time 3)  Iron 325 (65 Fe) Mg Tabs (Ferrous sulfate) .... Take 1 tablet by mouth three times a day 4)  Dexilant 60 Mg Cpdr (Dexlansoprazole) .... One take  one 30-60 min before first meal of the day 5)  Cozaar 25 Mg Tabs (Losartan potassium) .... Take 1 tablet by mouth once a day 6)  Hair/skin/nails Tabs (Multiple vitamins-minerals) .... Take 1 tablet by mouth once a day 7)  Advair Diskus 250-50 Mcg/dose Misc (Fluticasone-salmeterol) .... Inhale 1 puff twice daily as directed 8)  Depo-provera 150 Mg/ml Susp (Medroxyprogesterone acetate) .... Intramuscular injection every 3 months. 9)  Pepcid Ac Maximum Strength 20 Mg Tabs (Famotidine) .... Take 1 tablet by mouth two times a day for heartburn 10)  Cyclobenzaprine Hcl 10 Mg Tabs (Cyclobenzaprine hcl) .Marland Kitchen.. 1 pill once daily at bed time as needed for muscle cramps. 11)  Ventolin Hfa 108 (90 Base) Mcg/act Aers (Albuterol sulfate) .... 2 puffs inhaled every 4-6 hours as needed for shortness of breath/ wheezing 12)  Zyrtec Allergy 10 Mg Tabs (Cetirizine hcl) .... Take 1 tab by mouth at bedtime as needed  Other Orders: Social  Work Referral (Social )  Patient Instructions: 1)  Stop Ibuprofen. 2)  Start Tylenol 500 mg 1-2 tablets every 4-6 hours as needed for the pain.   3)  Increase the Pepcid to twice daily. 4)  Continue all your other medications 5)  We will schedule a GI consult for a possible EGD to look at your stomach. 6)  Please schedule a follow-up appointment in 1 month. Prescriptions: PEPCID AC MAXIMUM STRENGTH 20 MG TABS (FAMOTIDINE) Take 1 tablet by mouth two times a day for heartburn  #60 x 3   Entered and Authorized by:   Leodis Sias MD   Signed by:   Leodis Sias MD on 08/04/2009   Method used:   [facility] to        [facility] [phone]* (retail)       [address] [place], [place]  [address]       Ph: [phone] or [phone]       Fax: [phone]   RxID:   [id number]  Process Orders Check Orders Results:     [facility]: ABN not required for this insurance Tests Sent for requisitioning (August 04, 2009 10:14 PM):     08/04/2009: [facility] -- T-CMP with Estimated GFR [[id number]] (signed)     08/04/2009: [facility] -- T-CBC w/Diff [[id number]] (signed)      Medication Administration  Injection # 1:    Medication: Depo-Provera 150mg     Diagnosis: CONTRACEPTIVE MANAGEMENT (ICD-V25.09)    Route: IM    Site: RUOQ gluteus    Exp Date: 05/2011    Lot #: [id number]    Mfr: [email or web address]    Patient tolerated injection without complications    Given by: Stanton Kidney Ditzler RN (August 04, 2009 10:59 AM)  Orders Added: 1)  Gastroenterology Referral [GI] 2)  T-CMP with Estimated GFR [[id number]] 3)  T-CBC w/Diff [[id number]] 4)  Est. Patient Level III [23557] 5)  Admin of Therapeutic Inj  intramuscular or subcutaneous [96372] 6)  Depo-Provera 150mg  [J1055] 7)  Social Work Referral [place] ]

## 2010-02-07 NOTE — Progress Notes (Signed)
  Phone Note Outgoing Call   Call placed by: Theotis Barrio NT II,  May 23, 2009 4:18 PM Call placed to: Patient Details for Reason: CARDIOLOGY APPT Summary of Call: CALLED AND SPOKE WITH [place], GAVE HER THE APPT WITH [facility] / DR Eden Emms  / MAY 27, 011 @ 8:30AM PATIENT WAS INSTURCTED TO CALL THEIR OFFICE IS SHE [place] UNABLE TO KEEP THIS APPT.Lela Sturdivant NT II  May 23, 2009 4:20 PM

## 2010-02-07 NOTE — Progress Notes (Signed)
Summary: Nuclear Pre-Procedure  Phone Note Outgoing Call   Call placed by: Milana Na, EMT-P,  June 09, 2009 2:25 PM Summary of Call: Left message with information on [employer] Information Sheet (see scanned document for details).      Nuclear Med Background Indications for Stress Test: Evaluation for Ischemia   History: Asthma, Echo  History Comments: 07/10 Respiratory Failure 2nd Asthma  Symptoms: Chest Pain  Symptoms Comments: Radiating  to the (L) shoulder   Nuclear Pre-Procedure Cardiac Risk Factors: Hypertension Height (in): 67  Nuclear Med Study Referring MD:  P.Sara Lee

## 2010-02-07 NOTE — Progress Notes (Signed)
Summary: pain, ongoing , med/ hla  Phone Note Call from Patient   Summary of Call: pt calls to request a stronger pain med, states the cardiologist didn't find any reason for her pain, states she does not take the pepcid ordered and was told not to take ibuprofen by dr Comer Locket, it remains on her med list though, appt is scheduled w/ dr Tonny Branch, dr Comer Locket has nothing available. pt request until appt that ibuprofen be refilled, i also put a request in for the pepcid. she states she has financial problems but confirms that she does have medicaid. appt set for 7/28 unless notified by dr Comer Locket for an overbook appt at his next clinic Initial call taken by: Marin Roberts RN,  July 26, 2009 12:19 PM  Follow-up for Phone Call        I would refill a month worth of Ibuprofen for now. Agree with appt on July 28th with Dr. Tonny Branch. Her pain issue needs to be addressed at that visit. Ideally I do not want to continue Ibuprofen 800 three times a day longterm. Follow-up by: [name] MD,  July 26, 2009 12:31 PM     Appended Document: pain, ongoing , med/ hla pt notified

## 2010-02-07 NOTE — Progress Notes (Signed)
  Phone Note Other Incoming   Request: Send information Summary of Call: Request for records received from [employer]. Request forwarded to [employer].      Appended Document:  Request recieved from [employer] sent to [facility]

## 2010-02-07 NOTE — Progress Notes (Signed)
Summary: prior authorization/gp  Phone Note Outgoing Call   Summary of Call: Prior authorization for Advair Diskus 250/50 has been approved x 2 years (until 01/10/11).  [facility] Initial call taken by: Chinita Pester RN,  January 10, 2009 1:52 PM

## 2010-02-07 NOTE — Assessment & Plan Note (Signed)
Summary: depo/gg  Nurse Visit   Allergies: 1)  ! * Peanuts  Medication Administration  Injection # 1:    Medication: Depo-Provera 150mg     Diagnosis: CONTRACEPTIVE MANAGEMENT (ICD-V25.09)    Route: IM    Site: RUOQ gluteus    Exp Date: 04/2011    Lot #: [id number]    Mfr: [email or web address]    Comments: Next injection July 28.    Patient tolerated injection without complications    Given by: Chinita Pester RN (May 13, 2009 11:41 AM)  Orders Added: 1)  Admin of Therapeutic Inj  intramuscular or subcutaneous [96372] 2)  Depo-Provera 150mg  [J1055]

## 2010-02-07 NOTE — Assessment & Plan Note (Signed)
Summary: Pulmnoary/ final f/u ov with nl spirometry off ace   Copy to:  Dr. Eden Emms Primary Provider/Referring Provider:  [name] MD  CC:  6 wk followup with PFT's.  Pt c/o increased SOB x 3 wks since "ran out of all meds"- she c/o worsening chest tightness x 3 wks- has had some left arm numbness also. She has had some cough- sometimes dry and sometimes prod with clear to green sputum. Marland Kitchen  History of Present Illness: 34 yobf smoker with h/o respiratory requiring life support at [facility] summer 2010  June 13, 2009 cc gen anterior postional cp off on and on for a year worse for sev months seems better sleeping typically doesn't wake up with it, worse with all activities esp rubbing chest wall.  no cough, on advair twice daily and still having trouble walking at the mall despite daily ventolin use.  minimal cough rec stop ace.   June 27, 2009--Presents for follow up and med review. Seen 2 weeks for atypical chest pain. Xray w/ no acute changes. recent cardiolite w/ no ischemia noted. 2 weeks ago. She was placed on GERD prevention and ACE inhibitor stopped. She was placed on Diovan. It appears after we discussed her meds today, she is also on Cozaar. We reveiewed her meds today and organized them into a med calendar. She  has not seen any improvment in her symptoms as of yet. Blood pressure is doing well since last visit. She does have low EF post critical illness in 2010 f/by cardiology.  July 29, 2009 3 wk followup.  Pt states that her breathing has been overall okay unless exposed to hot/humid weather.  She c/o aches in chest and arms x 1 wk.  She also has had fever and night sweats x 1 wk.  Cut back smoking to 2-3 cigs per day. much more limited by orthopedic issues than doe, no cough. rec no change rx   September 09, 2009 6 wk followup with PFT's.  Pt c/o increased SOB x 3 wks since "ran out of all meds"- she c/o worsening chest tightness x 3 wks- has had some left arm numbness also. She has had  some cough- sometimes dry and sometimes prod with clear to green sputum.  still smoking.  Pt denies any significant sore throat, dysphagia, itching, sneezing,  nasal congestion or excess secretions,  fever, chills, sweats, unintended wt loss, pleuritic or exertional cp, hempoptysis ,  orthopnea pnd or leg swelling   Current Medications (verified): 1)  None  Allergies (verified): 1)  ! * Peanuts  Past History:  Past Medical History: heart murmur  LEFT VENTRICULAR FUNCTION, DECREASED    -Low EF 20 -25%, in the setting of severe illness requring ICU care 06/2008   - cardiolyte June 13, 2009 Normal perfusion without significant ischemia or scar.   LVEF 38%.   VDRF, 2/2 status asthmaticus 6/11-18/2010 admit [facility] HTN    - Try off ACE June 13, 2009 due to pseudoasthma VISION DISORDER ANEMIA, IRON DEFICIENCY ASTHMA vs Pseudoasthma from ace...........................................................Marland KitchenWert      - Trial off ace  > PFT's normal 09/09/09 x DLC0 59 > corrects to 73%    Vital Signs:  Patient profile:   35 year old female Height:      67 inches Weight:      163 pounds O2 Sat:      99 % on Room air Temp:     98.1 degrees F oral Pulse rate:  70 / minute BP sitting:   110 / 72  (left arm)  Vitals Entered By: Vernie Murders (September 09, 2009 2:14 PM)  O2 Flow:  Room air  Physical Exam  Additional Exam:  wt   161 June 13, 2009 >>160 June 27, 2009 > 166 July 30, 2009> 163 September 09, 2009  amb wf can barely get out of a chair without sob HEENT: nl dentition, turbinates, and orophanx. Nl external ear canals without cough reflex NECK :  without JVD/Nodes/TM/ nl carotid upstrokes bilaterally LUNGS: no acc muscle use, clear to A and P bilaterally without cough on insp or exp maneuvers CV:  RRR  no s3 or murmur or increase in P2, no edema  ABD:  soft and nontender with nl excursion in the supine position. No bruits or organomegaly, bowel sounds nl MS:  warm without deformities,  calf tenderness, cyanosis or clubbing    Impression & Recommendations:  Problem # 1:  ASTHMA (ICD-493.90) No evidence of copd and no evidence of active asthma at this point either.    DDX of  difficult airways managment all start with A and  include Adherence, Ace Inhibitors, Acid Reflux, Active Sinus Disease, Alpha 1 Antitripsin deficiency, Anxiety masquerading as Airways dz,  ABPA,  allergy(esp in young), Aspiration (esp in elderly), Adverse effects of DPI,  Active smokers, plus one B  = Beta blocker use..  and one C = CHF  She has chf but is much better off ace which probably caused pseudoasthma > maintain on ARB indefinitely  Problem # 2:  SMOKER (ICD-305.1)   I emphasized that although we never turn away smokers from the pulmonary clinic, we do ask that they understand that the recommendations that were made won't work nearly as well in the presence of continued cigarette exposure and we may reach a point where we can't help the patient if he/she can't quit smoking.     I took this opportunity to educate the patient regarding the consequences of smoking in airway disorders based on all the data we have from the multiple national lung health studies indicating that smoking cessation, not choice of inhalers or physicians, is the most important aspect of care.  Pulmonary f/u can be as needed at this point.  Orders: Est. Patient Level IV (16109)  Medications Added to Medication List This Visit: 1)  Diovan 160 Mg Tabs (Valsartan) .... One by mouth daily  Patient Instructions: 1)  Diovan 160 one daily 2)  You should not use ACE inhibitors as they make you look like you have bad asthma or copd when you don't 3)  If you stop smoking now you should preserve your lung function where it is now which is  88% of normal which is great.

## 2010-02-07 NOTE — Miscellaneous (Signed)
Summary: phone call  Rebecca Holmes called on Sunday evening saying that she has a vaginal discharge that started of as yellowish and is bloody now. she also has some abdominal pain and nausea. no fever, vomiting. she has been rescently started on depo shots but has not used any back up contraceptive. Have asked her to come to the ED to get tested for possible PID. she voices understandign. Have asked her to take tylenol for pain for now.

## 2010-02-07 NOTE — Assessment & Plan Note (Signed)
Summary: REQUESTING TO BE STARTED ON/THE DEPO SHOTS/CK/CFB   Vital Signs:  Patient profile:   35 year old female Height:      67 inches (170.18 cm) Weight:      165.6 pounds (73.36 kg) BMI:     25.37 Temp:     98.8 degrees F (37.11 degrees C) oral Pulse rate:   68 / minute BP sitting:   121 / 80  (left arm) Cuff size:   regular  Vitals Entered By: Theotis Barrio NT II (May 12, 2009 3:58 PM) CC: HERE TO GET STARTED ON DEPO / TO OBTAIN URINE Is Patient Diabetic? No Pain Assessment Patient in pain? no       Have you ever been in a relationship where you felt threatened, hurt or afraid?No   Does patient need assistance? Functional Status Self care Ambulation Normal Comments TO GET STARTED ON  ON DEPO   Primary Care Provider:  [name] MD  CC:  HERE TO GET STARTED ON DEPO / TO OBTAIN URINE.  History of Present Illness: 35 yr old female comes to the office for a regular office visit. She reports that hasnt been taking her iron tablets  as they she doesn't like taking pills. She does use the advair daily and occasionally albuterol but she doesn't use the cymbalta, seroquel. She complains of lot of financial stress, family related stress and reports.  She is requesting for a depo shot. She took this shot in the past (about 2000). She reports that she started menstruating 2 days ago.   She denies any other complaints.  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 Inhale 1 Puff Twice Daily As Directed 2)  Ventolin Hfa 108 (90 Base) Mcg/act Aers (Albuterol Sulfate) .... 2 Puffs Inhaled Every 4-6 Hours As Needed For Shortness of Breath/ Wheezing 3)  Iron 325 (65 Fe) Mg Tabs (Ferrous Sulfate) .... Take 1 Tablet By Mouth Three Times A Day 4)  Seroquel 100 Mg Tabs (Quetiapine Fumarate) .... Half To 1 Tablet Once Daily At Bed Time 5)  Cymbalta 60 Mg Cpep (Duloxetine Hcl) .... Take 1 Tablet By Mouth Once A Day 6)  Cyclobenzaprine Hcl 10 Mg Tabs (Cyclobenzaprine Hcl) .Marland Kitchen.. 1 Pill Once Daily At Bed Time As Needed For Muscle Cramps.  Current Medications (verified): 1)  Advair Diskus 250-50 Mcg/dose Misc (Fluticasone-Salmeterol) .... Inhale 1 Puff Twice Daily As Directed 2)  Ventolin Hfa 108 (90 Base) Mcg/act Aers (Albuterol Sulfate) .... 2 Puffs Inhaled Every 4-6 Hours As Needed For Shortness of Breath/ Wheezing 3)  Iron 325 (65 Fe) Mg Tabs (Ferrous Sulfate) .... Take 1 Tablet By Mouth Three Times A Day 4)  Seroquel 100 Mg Tabs (Quetiapine Fumarate) .... Half To 1 Tablet Once Daily At Bed Time 5)  Cymbalta 60 Mg Cpep (Duloxetine Hcl) .... Take 1 Tablet By  Mouth Once A Day 6)  Cyclobenzaprine Hcl 10 Mg Tabs (Cyclobenzaprine Hcl) .Marland Kitchen.. 1 Pill Once Daily At Bed Time As Needed For Muscle Cramps.  Allergies (verified): 1)  ! * Peanuts  Past History:  Family History: Last updated: 07/13/2008 Mom 44's - DM and ?cancer Dad- DONT KNOW ANYTHING 2 brothers- youngest brother (20's) - type 2 DM oldest brother - healthy  Social History: Last updated: 07/13/2008 Currently not working lives with her boy friend in [place] 5 kids - 3 boys and 2 girls.  Risk Factors: Exercise: yes (05/12/2009)  Risk Factors: Smoking Status: quit (05/12/2009)  Past Medical History: Reviewed history from 07/13/2008 and no changes required. VDRF,  2/2 status asthmaticus. Bronchial asthma HTN Tobacco abuse, stopped recently Marijuana abuse, stoppedrecently Low EF 20 -25%, in the setting of severe illness requring ICU care, to get a repeat 2d echo in 6-8 months.  Family History: Reviewed history from 07/13/2008 and no changes required. Mom 28's - DM and ?cancer Dad- DONT KNOW ANYTHING 2 brothers- youngest brother (20's) - type 2 DM oldest brother - healthy  Social History: Reviewed history from 07/13/2008 and no changes required. Currently not working lives with her boy friend in [place] 5 kids - 3 boys and 2 girls.Does Patient Exercise:  yes  Review of Systems      See HPI  Physical Exam  General:  Well-developed,well-nourished,in no acute distress; alert,appropriate and cooperative throughout examination Mouth:  Oral mucosa and oropharynx without lesions or exudates.  Teeth in good repair. Neck:  No deformities, masses, or tenderness noted. Lungs:  Normal respiratory effort, chest expands symmetrically. Lungs are clear to auscultation, no crackles or wheezes. Heart:  Normal rate and regular rhythm. S1 and S2 normal without gallop, murmur, click, rub or other extra sounds. Abdomen:  Bowel sounds positive,abdomen soft and non-tender without masses, organomegaly or hernias noted. Msk:  normal ROM.   Pulses:  R radial normal.   Extremities:  No clubbing, cyanosis, edema, or deformity noted with normal full range of motion of all joints.   Neurologic:  alert & oriented X3, strength normal in all extremities, and gait normal.     Impression & Recommendations:  Problem # 1:  ANEMIA, IRON DEFICIENCY (ICD-280.9)  Patient is currently not taking iron supplements. Recommended to restart her iron tabs. Will check CBC.  Her updated medication list for this problem includes:    Iron 325 (65 Fe) Mg Tabs (Ferrous sulfate) .Marland Kitchen... Take 1 tablet by mouth three times a day  Orders: T-Comprehensive Metabolic Panel ([id number]) T-CBC  w/Diff ([id number])  Problem # 2:  LEFT VENTRICULAR FUNCTION, DECREASED (ICD-429.2)  She had decreased EF last year, in the setting of an ICU admission for asthma exac requiring intubation. It was thought to be secondary to critical illness.Will repeat 2d Echo to evaluate the LVEF.  Orders: T-2D Echo Complete (30865)  Problem # 3:  ASTHMA (ICD-493.90) Well controlled on current regimen, Cont advair.  Her updated medication list for this problem includes:    Advair Diskus 250-50 Mcg/dose Misc (Fluticasone-salmeterol) ..... Inhale 1 puff twice daily as directed    Ventolin Hfa 108 (90 Base) Mcg/act Aers (Albuterol sulfate) .Marland Kitchen... 2 puffs inhaled every 4-6 hours as needed for shortness of breath/ wheezing  Problem # 4:  HYPERTENSION, BENIGN (ICD-401.1) Well controlled.  BP today: 121/80 Prior BP: 104/71 (07/13/2008)  Problem # 5:  CONTRACEPTIVE MANAGEMENT (ICD-V25.09)  Patient wants to depo-provera. Will check Urine pregnancy test and will give IM shot once negative. She  is on 3 rd of her menstruation.  Orders: T-Urine Pregnancy (in -house) [id number])  Complete Medication List: 1)  Advair Diskus 250-50 Mcg/dose Misc (Fluticasone-salmeterol) .... Inhale 1 puff twice daily as directed 2)  Ventolin Hfa 108 (90 Base) Mcg/act Aers (Albuterol sulfate) .... 2 puffs inhaled every 4-6 hours as needed for shortness of breath/ wheezing 3)  Iron 325 (65 Fe) Mg Tabs (Ferrous sulfate) .... Take 1 tablet by mouth three times a day 4)  Seroquel 100 Mg Tabs (Quetiapine fumarate) .... Half to 1 tablet once daily at bed time 5)  Cymbalta 60 Mg Cpep (Duloxetine hcl) .... Take 1 tablet by mouth once a day 6)  Cyclobenzaprine Hcl 10 Mg Tabs (Cyclobenzaprine hcl) .Marland Kitchen.. 1 pill once daily at bed time as needed for muscle cramps. 7)  Depo-provera 150 Mg/ml Susp (Medroxyprogesterone acetate) .... Intramuscular injection every 3 months.  Patient Instructions: 1)  Please schedule a follow-up appointment in 6  months. 2)  Take all the medications as advised below. Prescriptions: CYMBALTA 60 MG CPEP (DULOXETINE HCL) Take 1 tablet by mouth once a day  #30 x 5   Entered and Authorized by:   [name] MD   Signed by:   [name] MD on 05/12/2009   Method used:   Print then Give to Patient   RxID:   [id number] SEROQUEL 100 MG TABS (QUETIAPINE FUMARATE) half to 1 tablet once daily at bed time  #30 x 5   Entered and Authorized by:   [name] MD   Signed by:   [name] MD on 05/12/2009   Method used:   Print then Give to Patient   RxID:   [id number] IRON 325 (65 FE) MG TABS (FERROUS SULFATE) Take 1 tablet by mouth three times a day  #90 x 6   Entered and Authorized by:   [name] MD   Signed by:   [name] MD on 05/12/2009   Method used:   Print then Give to Patient   RxID:   [id number] VENTOLIN HFA 108 (90 BASE) MCG/ACT AERS (ALBUTEROL SULFATE) 2 puffs inhaled every 4-6 hours as needed for shortness of breath/ wheezing  #1 month supp x 6   Entered and Authorized by:   [name] MD   Signed by:   [name] MD on 05/12/2009   Method used:   Print then Give to Patient   RxID:   [id number] ADVAIR DISKUS 250-50 MCG/DOSE MISC (FLUTICASONE-SALMETEROL) inhale 1 puff twice daily as directed  #1 month sup x 11   Entered and Authorized by:   [name] MD   Signed by:   [name] MD on 05/12/2009   Method used:   Print then Give to Patient   RxID:   [id number]  Process Orders Check Orders Results:     [facility]: ABN not required for this insurance Tests Sent for requisitioning (May 12, 2009 4:40 PM):     05/12/2009: [facility] -- T-Comprehensive Metabolic Panel [[id number]] (signed)     05/12/2009: [facility] -- T-CBC w/Diff [[id number]] (signed)     05/12/2009: [facility] -- T-Comprehensive Metabolic Panel [[id number]] (signed)     05/12/2009: [facility] -- [facility] w/Diff [[id number]] (signed)    Prevention & Chronic Care Immunizations   Influenza vaccine: Not documented    Tetanus booster: Not documented    Pneumococcal vaccine: Not documented  Other Screening   Pap smear: Not documented   Smoking status: quit  (  05/12/2009)  Hypertension   Last Blood Pressure: 121 / 80  (05/12/2009)   Serum creatinine: Not documented   Serum potassium Not documented CMP ordered   Self-Management Support :   Personal Goals (by the next clinic visit) :      Personal blood pressure goal: 140/90  (05/12/2009)   Patient will work on the following items until the next clinic visit to reach self-care goals:     Medications and monitoring: take my medicines every day  (05/12/2009)     Eating: drink diet soda or water instead of juice or soda, eat more vegetables, use fresh or frozen vegetables, eat foods that are low in salt, eat baked foods instead of fried foods, eat fruit for snacks and desserts, limit or avoid alcohol  (05/12/2009)     Activity: take a 30 minute walk every day  (05/12/2009)    Hypertension self-management support: Not documented   Appended Document: REQUESTING TO BE STARTED ON/THE DEPO SHOTS/CK/CFB   Impression & Recommendations:  Problem # 1:  CONTRACEPTIVE MANAGEMENT (ICD-V25.09) Urine sample was mixed with blood. Will check Serum pregnancy  Will bring her back tomorrow. Will give the depo shot if the pregnancy test is negative.  Orders: T-Pregnancy (Serum), Qual.  [phone])   Complete Medication List: 1)  Advair Diskus 250-50 Mcg/dose Misc (Fluticasone-salmeterol) .... Inhale 1 puff twice daily as directed 2)  Ventolin Hfa 108 (90 Base) Mcg/act Aers (Albuterol sulfate) .... 2 puffs inhaled every 4-6 hours as needed for shortness of breath/ wheezing 3)  Iron 325 (65 Fe) Mg Tabs (Ferrous sulfate) .... Take 1 tablet by mouth three times a day 4)  Seroquel 100 Mg Tabs (Quetiapine fumarate) ....  Half to 1 tablet once daily at bed time 5)  Cymbalta 60 Mg Cpep (Duloxetine hcl) .... Take 1 tablet by mouth once a day 6)  Cyclobenzaprine Hcl 10 Mg Tabs (Cyclobenzaprine hcl) .Marland Kitchen.. 1 pill once daily at bed time as needed for muscle cramps. 7)  Depo-provera 150 Mg/ml Susp (Medroxyprogesterone acetate) .... Intramuscular injection every 3 months.  Prescriptions: CYCLOBENZAPRINE HCL 10 MG TABS (CYCLOBENZAPRINE HCL) 1 pill once daily at bed time as needed for muscle cramps.  #60 x 0   Entered and Authorized by:   [name] MD   Signed by:   [name] MD on 05/12/2009   Method used:   Print then Give to Patient   RxID:   [id number]    Appended Document: REQUESTING TO BE STARTED ON/THE DEPO SHOTS/CK/CFB UNABLE TO USE URINE FOR UPT TEST (PATIENT STARTED HER CYCLE) , SO ORDER FOR SERUM PREG. TEST WAS DONE. PATIENT SHOULD GET RESULTS 5-6-011 . DR Morgane Joerger HAS WRITTEN ORDER FOR THE DEPO INJECTIONS EVERY THREE MONTHS , IS RESULTS ARE NEGATIVE. LELA STURDIVANT NTII

## 2010-02-07 NOTE — Progress Notes (Signed)
Summary: refill/ hla  Phone Note Refill Request Message from:  Patient on July 26, 2009 12:10 PM  Refills Requested: Medication #1:  PEPCID AC MAXIMUM STRENGTH 20 MG TABS one at bedtime   Dosage confirmed as above?Dosage Confirmed  Medication #2:  IBUPROFEN 800 MG TABS Take 1 tablet by mouth every 8 hours as needed   Dosage confirmed as above?Dosage Confirmed Initial call taken by: Marin Roberts RN,  July 26, 2009 12:10 PM  Follow-up for Phone Call       Follow-up by: [name] MD,  July 26, 2009 12:29 PM    Prescriptions: IBUPROFEN 800 MG TABS (IBUPROFEN) Take 1 tablet by mouth every 8 hours as needed  #90 x 0   Entered and Authorized by:   [name] MD   Signed by:   [name] MD on 07/26/2009   Method used:   Electronically to        [facility] [phone]* (retail)       [address], [place]  [address]       Ph: [phone] or [phone]       Fax: [phone]   RxID:   [id number] IBUPROFEN 800 MG TABS (IBUPROFEN) Take 1 tablet by mouth every 8 hours as needed  #90 x 0   Entered and Authorized by:   [name] MD   Signed by:   [name] MD on 07/26/2009   Method used:   Electronically to        [facility] [phone]* (retail)       [address] [place]  [address]       Ph: [phone] or [phone]       Fax: [phone]   RxID:   [id number] PEPCID AC MAXIMUM STRENGTH 20 MG TABS (FAMOTIDINE) one at bedtime  #30 x 5   Entered and Authorized by:   [name] MD   Signed by:   [name] MD on 07/26/2009   Method used:   Electronically to        [facility] [phone]* (retail)       [address]       [place], [place]  [address]       Ph: [phone] or [phone]       Fax: [phone]   RxID:   [id number]

## 2010-02-07 NOTE — Assessment & Plan Note (Signed)
Summary: Cardiology Nuclear Study  Nuclear Med Background Indications for Stress Test: Evaluation for Ischemia   History: Asthma, Echo  History Comments: 07/10 Respiratory Failure 2nd Asthma  Symptoms: Chest Pain, Diaphoresis, Palpitations, Vomiting  Symptoms Comments: Radiating  to the (L) shoulder   Nuclear Pre-Procedure Cardiac Risk Factors: Hypertension Caffeine/Decaff Intake: None NPO After: 7:30 AM Lungs: clear IV 0.9% NS with Angio Cath: 22g     IV Site: (R) Forearm IV Started by: Irean Hong RN Chest Size (in) 36     Cup Size C     Height (in): 67 Weight (lb): 158 BMI: 24.84 Tech Comments: This patient was scheduled to walk on the treadmill today. During the exercise portion of her test, she became very sob, had 9/10 cp, and had multifocal pvcs during the test. Her BPs were blunted. The patient was given 1 sublingual Nitro and rested in recovery. The patient stated that all the chest pain was gone. The patient was moved to the waiting area for the rest of her time, she then came back in my room complaining of chest pain 7/10 again. Patient placed back on the monitor, BP checked and was given another sublingual Nitro. The patient stated that her cp had resolved after about 15 minutes. Pictures will be checked and DOD J.Hochrein will be consulted.   Nuclear Med Study 1 or 2 day study:  1 day     Stress Test Type:  Stress Reading MD:  Dietrich Pates, MD     Referring MD:  P.Nishan Resting Radionuclide:  Technetium 70m Tetrofosmin     Resting Radionuclide Dose:  11.0 mCi  Stress Radionuclide:  Technetium 64m Tetrofosmin     Stress Radionuclide Dose:  33.0 mCi   Stress Protocol Exercise Time (min):  6:15 min     Max HR:  171 bpm     Predicted Max HR:  187 bpm  Max Systolic BP: 116 mm Hg     Percent Max HR:  91.44 %     METS: 7.0 Rate Pressure Product:  21308    Stress Test Technologist:  Milana Na EMT-P     Nuclear Technologist:  Domenic Polite CNMT  Rest Procedure    Myocardial perfusion imaging was performed at rest 45 minutes following the intravenous administration of Myoview Technetium 87m Tetrofosmin.  Stress Procedure  The patient exercised for 6:15. The patient stopped due to fatigue, sob, and chest pain.  There were no significant ST-T wave changes and occ multifocal pvcs.  Myoview was injected at peak exercise and myocardial perfusion imaging was performed after a brief delay.  QPS Raw Data Images:  Soft tissue (diaphragm, breast) surround heart. Stress Images:  There is normal uptake in all areas. Rest Images:  Normal homogeneous uptake in all areas of the myocardium. Subtraction (SDS):  No evidence of ischemia. Transient Ischemic Dilatation:  1.07  (Normal <1.22)  Lung/Heart Ratio:  .24  (Normal <0.45)  Quantitative Gated Spect Images QGS EDV:  100 ml QGS ESV:  62 ml QGS EF:  38 % QGS cine images:  Anterior and lateral hypokinesis.   Overall Impression  Exercise Capacity: Fair exercise capacity. BP Response: Hypotensive blood pressure response. Clinical Symptoms: Significant chest pain. ECG Impression: No significant ST segment change suggestive of ischemia. Overall Impression Comments: NOrmal perfusion without significant ischemia or scar.   LVEF 38%.  Consider echo to further define LV function.  Appended Document: Cardiology Nuclear Study Consistant with non ischemic DCM.  Continue diovan.  F/U with me  3 months  Appended Document: Cardiology Nuclear Study Left message to call back     Appended Document: Cardiology Nuclear Study pt aware of results

## 2010-02-07 NOTE — Assessment & Plan Note (Signed)
Summary: EST-2 MONTH F/UVISIT/CH   Vital Signs:  Patient profile:   35 year old female Height:      67 inches Weight:      160.3 pounds BMI:     25.20 Temp:     99.0 degrees F oral Pulse rate:   84 / minute BP sitting:   119 / 84  Vitals Entered By: Filomena Jungling NT II (October 07, 2009 2:40 PM) CC: back pain x 1 week/ not relief by the  flexeriorl Is Patient Diabetic? No Pain Assessment Patient in pain? yes     Location: back Intensity: 9 Type: aching Onset of pain  1 WEEK AND A HALF Nutritional Status BMI of 25 - 29 = overweight  Have you ever been in a relationship where you felt threatened, hurt or afraid?No   Does patient need assistance? Functional Status Self care Ambulation Normal   Primary Care Provider:  [name] MD  CC:  back pain x 1 week/ not relief by the  flexeriorl.  History of Present Illness: 35 year old female who presents today with a complaint of lower back pain  Pain started about a week ago, gradually getting worse. She ran out of her Flexeril and since then she has been feeling this way. She reports tingling in her hands. She denies any trauma.  She reports that her breathing is "fine" and denies any other complaints.  Preventive Screening-Counseling & Management  Alcohol-Tobacco     Alcohol drinks/day: 1     Smoking Status: current     Smoking Cessation Counseling: yes     Packs/Day: 0.5     Year Quit: June, 2010  Caffeine-Diet-Exercise     Caffeine use/day: 1-2 drinks per day     Does Patient Exercise: yes     Type of exercise: WII FIT BOARD     Exercise (avg: min/session): 6:15  Current Medications (verified): 1)  Diovan 160 Mg  Tabs (Valsartan) .... One By Mouth Daily 2)  Losartan Potassium 25 Mg Tabs (Losartan Potassium) .Marland Kitchen.. 1 Tab By Mouth Once Daily  Allergies: 1)  ! * Peanuts  Past History:  Past Medical History: Last updated: 09/09/2009 heart murmur  LEFT VENTRICULAR FUNCTION, DECREASED    -Low EF 20 -25%, in  the setting of severe illness requring ICU care 06/2008   - cardiolyte June 13, 2009 Normal perfusion without significant ischemia or scar.   LVEF 38%.   VDRF, 2/2 status asthmaticus 6/11-18/2010 admit [facility] HTN    - Try off ACE June 13, 2009 due to pseudoasthma VISION DISORDER ANEMIA, IRON DEFICIENCY ASTHMA vs Pseudoasthma from ace...........................................................Marland KitchenWert      - Trial off ace  > PFT's normal 09/09/09 x DLC0 59 > corrects to 73%    Past Surgical History: Last updated: 05/31/2009 dilation and curettage   Family History: Last updated: 07/13/2008 Mom 50's - DM and ?cancer Dad- DONT KNOW ANYTHING 2 brothers- youngest brother (20's) - type 2 DM oldest brother - healthy  Social History: Last updated: 06/13/2009 Currently not working lives with her boy friend in [place] 5 kids - 3 boys and 2 girls. Current smoker since age 55.  Smoked 1/2 ppd. ETOH occ  Risk Factors: Alcohol Use: 1 (10/07/2009) Caffeine Use: 1-2 drinks per day (10/07/2009) Exercise: yes (10/07/2009)  Risk Factors: Smoking Status: current (10/07/2009) Packs/Day: 0.5 (10/07/2009)  Family History: Reviewed history from 07/13/2008 and no changes required. Mom 14's - DM and ?cancer Dad- DONT KNOW ANYTHING 2 brothers- youngest  brother (20's) - type 2 DM oldest brother - healthy  Social History: Reviewed history from 06/13/2009 and no changes required. Currently not working lives with her boy friend in [place] 5 kids - 3 boys and 2 girls. Current smoker since age 47.  Smoked 1/2 ppd. ETOH occ  Review of Systems      See HPI  Physical Exam  General:  alert, well-developed, well-nourished, and well-hydrated.   Head:  normocephalic and atraumatic.   Mouth:  good dentition and no gingival abnormalities.   Neck:  supple and full ROM.   Lungs:  normal respiratory effort, no intercostal retractions, no accessory muscle use, and normal breath sounds.   Heart:  normal rate, regular  rhythm, no murmur, and no gallop.   Abdomen:  soft, non-tender, normal bowel sounds, and no distention.   Msk:  normal ROM.   Pulses:  R radial normal.   Extremities:  No clubbing, cyanosis, edema, or deformity noted with normal full range of motion of all joints.   Neurologic:  alert & oriented X3, cranial nerves II-XII intact, strength normal in all extremities, and gait normal.     Impression & Recommendations:  Problem # 1:  SMOKER (ICD-305.1) Counselled him againre: cessation. Patient is not interested.  Problem # 2:  ANEMIA, IRON DEFICIENCY (ICD-280.9) Last Hgb was normal and has stopped taking iron supplementation.  Problem # 3:  HYPERTENSION, BENIGN (ICD-401.1) Well controlled. Continue current regimen.  The following medications were removed from the medication list:    Losartan Potassium 25 Mg Tabs (Losartan potassium) .Marland Kitchen... 1 tab by mouth once daily Her updated medication list for this problem includes:    Diovan 160 Mg Tabs (Valsartan) ..... One by mouth daily  BP today: 119/84 Prior BP: 110/72 (09/09/2009)  Labs Reviewed: K+: 4.0 (08/04/2009) Creat: : 0.84 (08/04/2009)     Problem # 4:  LEFT VENTRICULAR FUNCTION, DECREASED (ICD-429.2) LVEF improving. Currently following with [facility] cards. Not sure why she is not on Bblocker therapy .Marland Kitchen ????could this be given her history of Asthma?? Recommended to follow up with cards as recommended.   The following medications were removed from the medication list:    Losartan Potassium 25 Mg Tabs (Losartan potassium) .Marland Kitchen... 1 tab by mouth once daily Her updated medication list for this problem includes:    Diovan 160 Mg Tabs (Valsartan) ..... One by mouth daily  Problem # 5:  BACK PAIN, CHRONIC (ICD-724.5)  Most likely Muscle spasms, given tenderness on palpation. Will get lumbar xrays.  Her updated medication list for this problem includes:    Flexeril 10 Mg Tabs (Cyclobenzaprine hcl) .Marland Kitchen... Take 1 tablet by mouth two times a day  as needed for muscle cramps  Orders: T-DG Lumbar Spine Complete (16109)  Complete Medication List: 1)  Diovan 160 Mg Tabs (Valsartan) .... One by mouth daily 2)  Cymbalta 60 Mg Cpep (Duloxetine hcl) .... Take 1 tablet by mouth once a day 3)  Seroquel 50 Mg Tabs (Quetiapine fumarate) .... Take 1 tablet by mouth once a day at bedtime 4)  Flexeril 10 Mg Tabs (Cyclobenzaprine hcl) .... Take 1 tablet by mouth two times a day as needed for muscle cramps  Patient Instructions: 1)  Please schedule a follow-up appointment in 3 months. 2)  Tobacco is very bad for your health and your loved ones! You Should stop smoking!. 3)  Stop Smoking Tips: Choose a Quit date. Cut down before the Quit date. decide what you will do as a substitute  when you feel the urge to smoke(gum,toothpick,exercise). Prescriptions: SEROQUEL 50 MG TABS (QUETIAPINE FUMARATE) Take 1 tablet by mouth once a day at bedtime  #30 x 0   Entered and Authorized by:   [name] MD   Signed by:   [name] MD on 10/07/2009   Method used:   Electronically to        [facility]. [phone]* (retail)       [address], [place]  [address]       Ph: [phone] or [phone]       Fax: [phone]   RxID:   [id number] CYMBALTA 60 MG CPEP (DULOXETINE HCL) Take 1 tablet by mouth once a day  #30 x 0   Entered and Authorized by:   [name] MD   Signed by:   [name] MD on 10/07/2009   Method used:   [facility]ronically to        [facility] [phone]* (retail)       [address] [place]  [address]       Ph: [phone] or [phone]       Fax: [phone]   RxID:   [id number] FLEXERIL 10 MG TABS (CYCLOBENZAPRINE HCL) Take 1 tablet by mouth two times a day as needed for muscle cramps  #60 x 0   Entered and Authorized by:   [name] MD   Signed by:   [name] MD on 10/07/2009   Method used:   [facility] to        [facility] [phone]* (retail)       [address], [place]  [address]       Ph: [phone] or [phone]       Fax: [phone]   RxID:   [id number]   Prevention & Chronic Care Immunizations   Influenza vaccine: Not documented    Tetanus booster: Not documented   Td booster deferral: Deferred  (10/07/2009)    Pneumococcal vaccine: Not documented  Other Screening   Pap smear: Not documented   Pap smear action/deferral: Deferred  (10/07/2009)   Smoking status: current  (10/07/2009)   Smoking cessation counseling: yes  (10/07/2009)  Hypertension   Last Blood Pressure: 119 / 84  (10/07/2009)   Serum creatinine: 0.84  (08/04/2009)   Serum potassium 4.0  (08/04/2009)  Self-Management Support :   Personal Goals (by the next clinic visit) :      Personal blood pressure goal: 140/90  (05/12/2009)   Patient will work on the following items until the next clinic visit to reach self-care goals:     Medications and monitoring: take my medicines every day  (10/07/2009)     Eating: drink diet soda or water instead of juice or soda, eat more vegetables, use fresh or frozen vegetables, eat foods that are low in salt, eat baked foods instead of fried foods, eat fruit for snacks and desserts  (10/07/2009)     Activity: take a 30 minute walk every day  (10/07/2009)    Hypertension self-management support: Education handout, Resources for patients handout  (10/07/2009)   Hypertension education handout printed      Resource handout printed.   Nursing Instructions: Give Flu vaccine today    Appended Document: EST-2 MONTH F/UVISIT/CH   Immunizations Administered:  Influenza Vaccine # 1:  Vaccine Type: Fluvax Non-MCR    Site: right deltoid    Mfr: GlaxoSmithKline    Dose: 0.5 ml    Route: IM    Given by: Angelina Ok RN    Exp. Date: 07/08/2010    Lot #: [id number]    VIS given: 08/02/09 version given October 07, 2009.  Flu Vaccine Consent Questions:    Do you have a history of severe allergic  reactions to this vaccine? no    Any prior history of allergic reactions to egg and/or gelatin? no    Do you have a sensitivity to the preservative Thimersol? no    Do you have a past history of Guillan-Barre Syndrome? no    Do you currently have an acute febrile illness? no    Have you ever had a severe reaction to latex? no    Vaccine information given and explained to patient? yes    Are you currently pregnant? no

## 2010-02-07 NOTE — Assessment & Plan Note (Signed)
Summary: depo shot/cfb  Nurse Visit   Allergies: 1)  ! * Peanuts  Medication Administration  Injection # 1:    Medication: Depo-Provera 150mg     Diagnosis: CONTRACEPTIVE MANAGEMENT (ICD-V25.09)    Route: IM    Site: RUOQ gluteus    Exp Date: 02/2012    Lot #: [id number]    Mfr: Francisca December    Comments: Pt. is on scheduled; last given 7/28. Next appt. will be 01/17/2010.    Patient tolerated injection without complications    Given by: Chinita Pester RN (October 26, 2009 10:54 AM)  Orders Added: 1)  Admin of Therapeutic Inj  intramuscular or subcutaneous [96372] 2)  Depo-Provera 150mg  [J1055]

## 2010-02-07 NOTE — Assessment & Plan Note (Signed)
Summary: NP follow up - med calendar   Copy to:  Dr. Eden Emms Primary Provider/Referring Provider:  [name] MD  CC:  new med calendar - pt brought all meds with her today. Marland Kitchen  History of Present Illness: 33yobf smoker with h/o respiratory  requiring life support at [facility] summer 2010  June 13, 2009 cc gen anterior postional cp off on and on for a year worse for sev months seems better sleeping typically doesn't wake up with it, worse with all activities esp rubbing chest wall.  no cough, on advair twice daily and still having trouble walking at the mall despite daily ventolin use.  minimal cough.   June 27, 2009--Presents for follow up and med review. Seen 2 weeks for atypical chest pain. Xray w/ no acute changes. recent cardiolite w/ no ischemia noted. 2 weeks ago. She was placed on GERD prevention and ACE inhibitor stopped. She was placed on Diovan. It appears after we discussed her meds today, she is also on Cozaar. We reveiewed her meds today and organized them into a med calendar. She  has not seen any improvment in her symptoms as of yet. Blood pressure is doing well since last visit. She does have low EF post critical illness in 2010 f/by cardiology. Denies  orthopnea, hemoptysis, fever, n/v/d, edema, headache,.    Medications Prior to Update: 1)  Advair Diskus 250-50 Mcg/dose Misc (Fluticasone-Salmeterol) .... Inhale 1 Puff Twice Daily As Directed 2)  Ventolin Hfa 108 (90 Base) Mcg/act Aers (Albuterol Sulfate) .... 2 Puffs Inhaled Every 4-6 Hours As Needed For Shortness of Breath/ Wheezing 3)  Iron 325 (65 Fe) Mg Tabs (Ferrous Sulfate) .... Take 1 Tablet By Mouth Three Times A Day 4)  Seroquel 100 Mg Tabs (Quetiapine Fumarate) .... Half To 1 Tablet Once Daily At Bed Time 5)  Cymbalta 60 Mg Cpep (Duloxetine Hcl) .... Take 1 Tablet By Mouth Once A Day 6)  Cyclobenzaprine Hcl 10 Mg Tabs (Cyclobenzaprine Hcl) .Marland Kitchen.. 1 Pill Once Daily At Bed Time As Needed For Muscle Cramps. 7)  Depo-Provera  150 Mg/ml Susp (Medroxyprogesterone Acetate) .... Intramuscular Injection Every 3 Months. 8)  [facility] Sinus Pain/congestion Day 5-325 Mg Tabs (Phenylephrine-Acetaminophen) .... As Directed As Needed 9)  [facility]np Hair/skin/nails  Tabs (Multiple Vitamins-Minerals) .Marland Kitchen.. 1 Three Times A Day 10)  Ferrous Sulfate 325 (65 Fe) Mg Tabs (Ferrous Sulfate) .Marland Kitchen.. 1 Three Times A Day 11)  Ibuprofen 600 Mg Tabs (Ibuprofen) .Marland Kitchen.. 1 Three Times A Day With Meals 12)  Claritin 10 Mg Tabs (Loratadine) .Marland Kitchen.. 1 Once Daily 13)  Diovan 80 Mg Tabs (Valsartan) .... One Daily 14)  Dexilant 60 Mg Cpdr (Dexlansoprazole) .... One Take  One 30-60 Min Before First Meal of The Day 15)  Pepcid Ac Maximum Strength 20 Mg Tabs (Famotidine) .... One At Bedtime  Allergies (verified): 1)  ! * Peanuts  Past History:  Past Surgical History: Last updated: 05/31/2009 dilation and curettage   Family History: Last updated: 07/13/2008 Mom 50's - DM and ?cancer Dad- DONT KNOW ANYTHING 2 brothers- youngest brother (20's) - type 2 DM oldest brother - healthy  Social History: Last updated: 06/13/2009 Currently not working lives with her boy friend in [place] 5 kids - 3 boys and 2 girls. Current smoker since age 59.  Smoked 1/2 ppd. ETOH occ  Risk Factors: Exercise: yes (05/12/2009)  Risk Factors: Smoking Status: current (06/13/2009)  Past Medical History: heart murmur  LEFT VENTRICULAR FUNCTION, DECREASED    -Low EF 20 -25%,  in the setting of severe illness requring ICU care 06/2008   - cardiolyte June 13, 2009 Normal perfusion without significant ischemia or scar.   LVEF 38%.   VDRF, 2/2 status asthmaticus 6/11-18/2010 admit [facility] HTN    - Try off ACE June 13, 2009  VISION DISORDER ANEMIA, IRON DEFICIENCY ASTHMA vs COPD............................................................Marland KitchenWert      - Trial off ace with f/u pft's rec June 13, 2009  Complex med regimen---. Meds reviewed with pt education and computerized med calendar completed  June 27, 2009   Review of Systems      See HPI  Vital Signs:  Patient profile:   35 year old female Height:      67 inches Weight:      160.31 pounds BMI:     25.20 O2 Sat:      99 % on Room air Temp:     97.5 degrees F oral Pulse rate:   112 / minute BP sitting:   108 / 78  (left arm) Cuff size:   regular  Vitals Entered By: Boone Master CNA/MA (June 27, 2009 2:26 PM)  O2 Flow:  Room air CC: new med calendar - pt brought all meds with her today.  Is Patient Diabetic? No Comments Medications reviewed with patient Daytime contact number verified with patient. Boone Master CNA/MA  June 27, 2009 2:26 PM    Physical Exam  Additional Exam:  wt 163 > 161 June 13, 2009 >>160 June 27, 2009  HEENT: nl dentition, turbinates, and orophanx. Nl external ear canals without cough reflex NECK :  without JVD/Nodes/TM/ nl carotid upstrokes bilaterally LUNGS: no acc muscle use, clear to A and P bilaterally without cough on insp or exp maneuvers CV:  RRR  no s3 or murmur or increase in P2, no edema  ABD:  soft and nontender with nl excursion in the supine position. No bruits or organomegaly, bowel sounds nl MS:  warm without deformities, calf tenderness, cyanosis or clubbing SKIN: warm and dry without lesions   NEURO:  alert, approp, no deficits     Impression & Recommendations:  Problem # 1:  ASTHMA (ICD-493.90) Atypical chest pain and dyspnea ? etilogy.  will check xray today  Meds reviewed with pt education and computerized med calendar completed/adjusted.  Possible upper airway inflammation w/ GERD , ACE inhibitor irritation  if not improving pending cxr results, consider CT chest .    Complete Medication List: 1)  Advair Diskus 250-50 Mcg/dose Misc (Fluticasone-salmeterol) .... Inhale 1 puff twice daily as directed 2)  Ventolin Hfa 108 (90 Base) Mcg/act Aers (Albuterol sulfate) .... 2 puffs inhaled every 4-6 hours as needed for shortness of breath/ wheezing 3)  Iron 325 (65  Fe) Mg Tabs (Ferrous sulfate) .... Take 1 tablet by mouth three times a day 4)  Seroquel 100 Mg Tabs (Quetiapine fumarate) .... Half to 1 tablet once daily at bed time 5)  Cymbalta 60 Mg Cpep (Duloxetine hcl) .... Take 1 tablet by mouth once a day 6)  Cyclobenzaprine Hcl 10 Mg Tabs (Cyclobenzaprine hcl) .Marland Kitchen.. 1 pill once daily at bed time as needed for muscle cramps. 7)  Depo-provera 150 Mg/ml Susp (Medroxyprogesterone acetate) .... Intramuscular injection every 3 months. 8)  [facility] Sinus Pain/congestion Day 5-325 Mg Tabs (Phenylephrine-acetaminophen) .... As directed as needed 9)  Gnp Hair/skin/nails Tabs (Multiple vitamins-minerals) .Marland Kitchen.. 1 three times a day 10)  Ferrous Sulfate 325 (65 Fe) Mg Tabs (Ferrous sulfate) .Marland Kitchen.. 1 three times a  day 11)  Ibuprofen 600 Mg Tabs (Ibuprofen) .Marland Kitchen.. 1 three times a day with meals 12)  Claritin 10 Mg Tabs (Loratadine) .Marland Kitchen.. 1 once daily 13)  Diovan 80 Mg Tabs (Valsartan) .... One daily 14)  Dexilant 60 Mg Cpdr (Dexlansoprazole) .... One take  one 30-60 min before first meal of the day 15)  Pepcid Ac Maximum Strength 20 Mg Tabs (Famotidine) .... One at bedtime  Other Orders: Est. Patient Level IV (09811) T-2 View CXR (71020TC)  Patient Instructions: 1)  Stop Diovan.  Continue on Cozaar 25mg  once daily.  2)   Continue on dexilant Take  one 30-60 min before first meal of the Add  pecid 20 mg one at bedtime 3)  Follow med calendar closely and bring to each visit.  4)  Committ to quit smoking  5)  follow up Dr. Sherene Sires in 3 weeks  6)  Please contact office for sooner follow up if symptoms do not improve or worsen     Appended Document: med calendar update    Clinical Lists Changes  Medications: Changed medication from CYMBALTA 60 MG CPEP (DULOXETINE HCL) Take 1 tablet by mouth once a day to CYMBALTA 30 MG CPEP (DULOXETINE HCL) 3 capsules by mouth  every morning Changed medication from SEROQUEL 100 MG TABS (QUETIAPINE FUMARATE) half to 1 tablet once daily at  bed time to SEROQUEL 100 MG TABS (QUETIAPINE FUMARATE) 1/2 tablet once daily at bed time Removed medication of FERROUS SULFATE 325 (65 FE) MG TABS (FERROUS SULFATE) 1 three times a day Added new medication of COZAAR 25 MG TABS (LOSARTAN POTASSIUM) Take 1 tablet by mouth once a day Added new medication of HAIR/SKIN/NAILS  TABS (MULTIPLE VITAMINS-MINERALS) Take 1 tablet by mouth once a day Changed medication from IBUPROFEN 600 MG TABS (IBUPROFEN) 1 three times a day with meals to IBUPROFEN 800 MG TABS (IBUPROFEN) Take 1 tablet by mouth every 8 hours as needed Changed medication from CLARITIN 10 MG TABS (LORATADINE) 1 once daily to ZYRTEC ALLERGY 10 MG TABS (CETIRIZINE HCL) Take 1 tab by mouth at bedtime as needed Removed medication of [facility] SINUS PAIN/CONGESTION DAY 5-325 MG TABS (PHENYLEPHRINE-ACETAMINOPHEN) as directed as needed Removed medication of [facility] HAIR/SKIN/NAILS  TABS (MULTIPLE VITAMINS-MINERALS) 1 three times a day Removed medication of DIOVAN 80 MG TABS (VALSARTAN) one daily

## 2010-02-07 NOTE — Assessment & Plan Note (Signed)
Summary: np3/decreased left ventricular function   Visit Type:  Initial Consult Primary Provider:  [name] MD   History of Present Illness: Rebecca Holmes is seen today at the request of [facility].  She was recently hospitalized with respitory failure and severe chronic asthmatic bronchitis.  She was found to have an EF 20-25% at the time of this serious illness.  F/U echo done 05/19/09 was reviewed and showed marked improvement  with EF 40-45%.  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  It can hurt to the touch.  She has not had a lot of wheezing.  She denies palpitations or syncope.  Echo showed no evidence of cor pulmonale.  Seh has been compliant with her meds.  I will have to look over her records and see why she is not on an ACE with history of decreased LV funciton.    Current Problems (verified): 1)  Contraceptive Management  (ICD-V25.09) 2)  Preventive Health Care  (ICD-V70.0) 3)  Vision Disorder  (ICD-368.9) 4)  Anemia, Iron Deficiency  (ICD-280.9) 5)  Left Ventricular Function, Decreased  (ICD-429.2) 6)  Asthma  (ICD-493.90) 7)  Marijuana Abuse  (ICD-305.20) 8)  Hypertension, Benign  (ICD-401.1) 9)  H/F Status Asthmaticus  (ICD-493.91) 10)  Respiratory Failure, Acute  (ICD-518.81)  Current Medications (verified): 1)  Advair Diskus 250-50 Mcg/dose Misc (Fluticasone-Salmeterol) .... Inhale 1 Puff Twice Daily As Directed 2)  Ventolin Hfa 108 (90 Base) Mcg/act Aers (Albuterol Sulfate) .... 2 Puffs Inhaled Every 4-6 Hours As Needed For Shortness of Breath/ Wheezing 3)  Iron 325 (65 Fe) Mg Tabs (Ferrous Sulfate) .... Take 1 Tablet By Mouth Three Times A Day 4)  Seroquel 100 Mg Tabs (Quetiapine Fumarate) .... Half To 1 Tablet Once Daily At Bed Time 5)  Cymbalta 60 Mg Cpep (Duloxetine Hcl) .... Take 1 Tablet By Mouth Once A Day 6)  Cyclobenzaprine Hcl 10 Mg Tabs (Cyclobenzaprine Hcl) .Marland Kitchen.. 1 Pill Once Daily At Bed Time As Needed For Muscle Cramps. 7)   Depo-Provera 150 Mg/ml Susp (Medroxyprogesterone Acetate) .... Intramuscular Injection Every 3 Months.  Allergies (verified): 1)  ! * Peanuts  Past History:  Past Medical History: Last updated: 05/31/2009 heart murmur  LEFT VENTRICULAR FUNCTION, DECREASED VDRF, 2/2 status asthmaticus. Bronchial asthma HTN Low EF 20 -25%, in the setting of severe illness requring ICU care, to get a repeat 2d echo in 6-8 months. VISION DISORDER ANEMIA, IRON DEFICIENCY Hosp for STATUS ASTHMATICUS  RESPIRATORY FAILURE  Past Surgical History: Last updated: 05/31/2009 dilation and curettage   Family History: Last updated: 07/13/2008 Mom 50's - DM and ?cancer Dad- DONT KNOW ANYTHING 2 brothers- youngest brother (20's) - type 2 DM oldest brother - healthy  Social History: Last updated: 05/31/2009 Currently not working lives with her boy friend in [place] 5 kids - 3 boys and 2 girls. Tobacco abuse, stopped recently Marijuana abuse, stoppedrecently  Review of Systems       Denies fever, malais, weight loss, blurry vision, decreased visual acuity, cough, sputum, , hemoptysis, pleuritic pain, palpitaitons, heartburn, abdominal pain, melena, lower extremity edema, claudication, or rash.   Vital Signs:  Patient profile:   35 year old female Height:      67 inches Weight:      163 pounds BMI:     25.62 Pulse rate:   87 / minute Resp:     16 per minute BP sitting:   131 / 92  (right arm)  Vitals Entered  By: Marrion Coy, CNA (Jun 03, 2009 8:52 AM)  Physical Exam  General:  Affect appropriate Healthy:  appears stated age HEENT: normal Neck supple with no adenopathy JVP normal no bruits no thyromegaly Lungs clear with no wheezing and good diaphragmatic motion Heart:  S1/S2 no murmur,rub, gallop or click PMI normal Abdomen: benighn, BS positve, no tenderness, no AAA no bruit.  No HSM or HJR Distal pulses intact with no bruits No edema Neuro non-focal Skin warm and dry    Impression  & Recommendations:  Problem # 1:  LEFT VENTRICULAR FUNCTION, DECREASED (ICD-429.2) Start ARB.  EF improved.  F/U MRII on 6 months Orders: Nuclear Stress Test (Nuc Stress Test)  Problem # 2:  ASTHMA (ICD-493.90) Refer to our pulmonary doctors for chronic F/U Continue our inhalers Her updated medication list for this problem includes:    Advair Diskus 250-50 Mcg/dose Misc (Fluticasone-salmeterol) ..... Inhale 1 puff twice daily as directed    Ventolin Hfa 108 (90 Base) Mcg/act Aers (Albuterol sulfate) .Marland Kitchen... 2 puffs inhaled every 4-6 hours as needed for shortness of breath/ wheezing  Orders: Pulmonary Referral (Pulmonary)  Problem # 3:  CHEST PAIN UNSPECIFIED (ICD-786.50) No acute ECG changes.  In the setting of decreased LV function will check myovue  Patient Instructions: 1)  Your physician recommends that you schedule a follow-up appointment in: 6 MONTHS 2)  You have been referred to PULMONARY FOR ASTHMA 3)  Your physician has requested that you have an exercise stress myoview.  For further information please visit [email or web address]  Please follow instruction sheet, as given. 4)  Your physician has recommended you make the following change in your medication: START LOSARTAN 25MG  ONCE DAILY Prescriptions: LOSARTAN POTASSIUM 25 MG TABS (LOSARTAN POTASSIUM) ONE TABLET by mouth once daily  #30 x 12   Entered by:   Deliah Goody, RN   Authorized by:   Colon Branch, MD, [facility]   Signed by:   Deliah Goody, RN on 06/03/2009   Method used:   [facility] [phone]* (retail)       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

## 2010-02-07 NOTE — Progress Notes (Signed)
Summary: REfill/gh  Phone Note Call from Patient   Caller: Patient Call For: Rebecca Reveal MD Summary of Call: Pt says that she continues to have the Chest Pain at a level of 6.  Has taken the Ibuproben and Tylenol ExtraStrenth.  Has been on Hydrocodone.  The Hydrocodone helped.  [facility] [address]  Continues to have thw shortness of breath.  No worse than before.Angelina Ok RN  August 09, 2009 4:21 PM  Initial call taken by: Angelina Ok RN,  August 09, 2009 4:22 PM  Follow-up for Phone Call        First of all, I don't see Hydrocodone in her active medication list and I don't think she got this medicine from our clinic as I could see.  Second of all, If she is having recurring chest pains, she needs to be seen by a physician ASAP and assess further.  Follow-up by: Rebecca Reveal MD,  August 15, 2009 7:06 PM

## 2010-02-07 NOTE — Miscellaneous (Signed)
Summary: [facility]  [facility]   Imported By: Margie Billet 09/22/2009 11:05:47  _____________________________________________________________________  External Attachment:    Type:   Image     Comment:   External Document

## 2010-02-07 NOTE — Progress Notes (Signed)
  DDS request recieved sent to [facility]  September 08, 2009 8:52 AM

## 2010-02-09 NOTE — Assessment & Plan Note (Signed)
Summary: DEPO/SB.  Nurse Visit   Allergies: 1)  ! * Peanuts  Medication Administration  Injection # 1:    Medication: Depo-Provera 150mg     Diagnosis: CONTRACEPTIVE MANAGEMENT (ICD-V25.09)    Route: IM    Site: RUOQ gluteus    Exp Date: 02/2012    Lot #: [id number]    Mfr: Illene Silver    Patient tolerated injection without complications    Given by: Angelina Ok RN (January 19, 2010 11:02 AM)  Orders Added: 1)  Admin of Therapeutic Inj  intramuscular or subcutaneous [96372] 2)  Depo-Provera 150mg  [J1055]   Medication Administration  Injection # 1:    Medication: Depo-Provera 150mg     Diagnosis: CONTRACEPTIVE MANAGEMENT (ICD-V25.09)    Route: IM    Site: RUOQ gluteus    Exp Date: 02/2012    Lot #: [id number]    Mfr: Illene Silver    Patient tolerated injection without complications    Given by: Angelina Ok RN (January 19, 2010 11:02 AM)  Orders Added: 1)  Admin of Therapeutic Inj  intramuscular or subcutaneous [96372] 2)  Depo-Provera 150mg  [J1055]

## 2010-02-13 ENCOUNTER — Ambulatory Visit: Payer: Self-pay | Admitting: Internal Medicine

## 2010-03-19 ENCOUNTER — Encounter: Payer: Self-pay | Admitting: Ophthalmology

## 2010-03-20 ENCOUNTER — Encounter: Payer: Self-pay | Admitting: Ophthalmology

## 2010-03-23 ENCOUNTER — Encounter: Payer: Self-pay | Admitting: *Deleted

## 2010-03-28 NOTE — Miscellaneous (Signed)
  Clinical Lists Changes  Medications: Changed medication from DIOVAN 160 MG  TABS (VALSARTAN) One by mouth daily to LOSARTAN POTASSIUM 25 MG TABS (LOSARTAN POTASSIUM) one tablet by mouth once daily - Signed Rx of LOSARTAN POTASSIUM 25 MG TABS (LOSARTAN POTASSIUM) one tablet by mouth once daily;  #30 x 12;  Signed;  Entered by: Deliah Goody, RN;  Authorized by: Colon Branch, MD, [facility];  Method used: [facility] [phone]*, [address], [place], [place]  [address], Ph: [phone] or [phone], Fax: [phone]    Prescriptions: LOSARTAN POTASSIUM 25 MG TABS (LOSARTAN POTASSIUM) one tablet by mouth once daily  #30 x 12   Entered by:   Deliah Goody, RN   Authorized by:   Colon Branch, MD, [facility]   Signed by:   Deliah Goody, RN on 03/23/2010   Method used:   [facility]        [facility] [phone]* (retail)       [address]       [place], [place]  [address]       Ph: [phone] or [phone]       Fax: [phone]   RxID:   [id number]

## 2010-04-07 ENCOUNTER — Ambulatory Visit (INDEPENDENT_AMBULATORY_CARE_PROVIDER_SITE_OTHER): Payer: Medicaid Other | Admitting: *Deleted

## 2010-04-07 DIAGNOSIS — Z309 Encounter for contraceptive management, unspecified: Secondary | ICD-10-CM

## 2010-04-07 MED ORDER — MEDROXYPROGESTERONE ACETATE 150 MG/ML IM SUSP
150.0000 mg | Freq: Once | INTRAMUSCULAR | Status: DC
  Administered 2010-04-07: 150 mg via INTRAMUSCULAR

## 2010-04-07 NOTE — Progress Notes (Signed)
Nursing visit only

## 2010-04-16 LAB — URINALYSIS, ROUTINE W REFLEX MICROSCOPIC
Bilirubin Urine: NEGATIVE
Glucose, UA: NEGATIVE mg/dL
Ketones, ur: NEGATIVE mg/dL
Nitrite: POSITIVE — AB
Specific Gravity, Urine: 1.026 (ref 1.005–1.030)
pH: 7.5 (ref 5.0–8.0)

## 2010-04-16 LAB — BASIC METABOLIC PANEL
CO2: 23 mEq/L (ref 19–32)
CO2: 25 mEq/L (ref 19–32)
Calcium: 9.4 mg/dL (ref 8.4–10.5)
Calcium: 8.8 mg/dL (ref 8.4–10.5)
Creatinine, Ser: 0.95 mg/dL (ref 0.4–1.2)
Creatinine, Ser: 1.08 mg/dL (ref 0.4–1.2)
GFR calc Af Amer: >60 mL/min (ref >60)
GFR calc Af Amer: >60 mL/min (ref >60)
GFR calc non Af Amer: >60 mL/min (ref >60)
GFR calc non Af Amer: 59 mL/min — ABNORMAL LOW (ref >60)
Glucose, Bld: 94 mg/dL (ref 70–99)
Sodium: 137 mEq/L (ref 135–145)
Sodium: 138 mEq/L (ref 135–145)

## 2010-04-16 LAB — DIFFERENTIAL
Basophils Absolute: 0.1 10*3/uL (ref 0.0–0.1)
Basophils Absolute: 0 10*3/uL (ref 0.0–0.1)
Basophils Relative: 2 % — ABNORMAL HIGH (ref 0–1)
Eosinophils Absolute: 0 10*3/uL (ref 0.0–0.7)
Eosinophils Absolute: 0.1 10*3/uL (ref 0.0–0.7)
Lymphocytes Relative: 29 % (ref 12–46)
Lymphocytes Relative: 17 % (ref 12–46)
Monocytes Absolute: 0.7 10*3/uL (ref 0.1–1.0)
Monocytes Relative: 6 % (ref 3–12)
Neutrophils Relative %: 58 % (ref 43–77)
Neutrophils Relative %: 76 % (ref 43–77)

## 2010-04-16 LAB — POCT CARDIAC MARKERS
CKMB, poc: <1 ng/mL — ABNORMAL LOW (ref 1.0–8.0)
Myoglobin, poc: 37.4 ng/mL (ref 12–200)
Myoglobin, poc: 43.1 ng/mL (ref 12–200)

## 2010-04-16 LAB — URINE MICROSCOPIC-ADD ON

## 2010-04-16 LAB — D-DIMER, QUANTITATIVE: D-Dimer, Quant: 0.95 ug/mL-FEU — ABNORMAL HIGH (ref 0.00–0.48)

## 2010-04-16 LAB — CBC
Hemoglobin: 12 g/dL (ref 12.0–15.0)
MCHC: 32.5 g/dL (ref 30.0–36.0)
MCHC: 32.6 g/dL (ref 30.0–36.0)
RBC: 4.33 MIL/uL (ref 3.87–5.11)
RDW: 32.4 % — ABNORMAL HIGH (ref 11.5–15.5)

## 2010-04-17 LAB — CBC
HCT: 31.9 % — ABNORMAL LOW (ref 36.0–46.0)
HCT: 25.9 % — ABNORMAL LOW (ref 36.0–46.0)
HCT: 27.2 % — ABNORMAL LOW (ref 36.0–46.0)
HCT: 27.5 % — ABNORMAL LOW (ref 36.0–46.0)
HCT: 30.4 % — ABNORMAL LOW (ref 36.0–46.0)
Hemoglobin: 7 g/dL — CL (ref 12.0–15.0)
Hemoglobin: 8 g/dL — ABNORMAL LOW (ref 12.0–15.0)
MCHC: 30.5 g/dL (ref 30.0–36.0)
MCHC: 31.8 g/dL (ref 30.0–36.0)
MCHC: 30.6 g/dL (ref 30.0–36.0)
MCHC: 30.8 g/dL (ref 30.0–36.0)
MCHC: 31.1 g/dL (ref 30.0–36.0)
MCHC: 31.5 g/dL (ref 30.0–36.0)
MCV: 73.3 fL — ABNORMAL LOW (ref 78.0–100.0)
MCV: 74.2 fL — ABNORMAL LOW (ref 78.0–100.0)
MCV: 74.8 fL — ABNORMAL LOW (ref 78.0–100.0)
MCV: 70.6 fL — ABNORMAL LOW (ref 78.0–100.0)
MCV: 71.9 fL — ABNORMAL LOW (ref 78.0–100.0)
Platelets: 195 10*3/uL (ref 150–400)
Platelets: 222 10*3/uL (ref 150–400)
Platelets: 286 10*3/uL (ref 150–400)
Platelets: 266 10*3/uL (ref 150–400)
Platelets: 294 10*3/uL (ref 150–400)
Platelets: 391 10*3/uL (ref 150–400)
Platelets: 565 10*3/uL — ABNORMAL HIGH (ref 150–400)
RBC: 3.97 MIL/uL (ref 3.87–5.11)
RBC: 3.24 MIL/uL — ABNORMAL LOW (ref 3.87–5.11)
RBC: 4.31 MIL/uL (ref 3.87–5.11)
RDW: 23 % — ABNORMAL HIGH (ref 11.5–15.5)
RDW: 23.5 % — ABNORMAL HIGH (ref 11.5–15.5)
RDW: 24.1 % — ABNORMAL HIGH (ref 11.5–15.5)
RDW: 21.3 % — ABNORMAL HIGH (ref 11.5–15.5)
RDW: 21.8 % — ABNORMAL HIGH (ref 11.5–15.5)
RDW: 22.5 % — ABNORMAL HIGH (ref 11.5–15.5)
RDW: 23.3 % — ABNORMAL HIGH (ref 11.5–15.5)
RDW: 23.3 % — ABNORMAL HIGH (ref 11.5–15.5)
WBC: 8.7 10*3/uL (ref 4.0–10.5)
WBC: 14.2 10*3/uL — ABNORMAL HIGH (ref 4.0–10.5)
WBC: 14.7 10*3/uL — ABNORMAL HIGH (ref 4.0–10.5)
WBC: 15.6 10*3/uL — ABNORMAL HIGH (ref 4.0–10.5)

## 2010-04-17 LAB — RETICULOCYTES
RBC.: 4.05 MIL/uL (ref 3.87–5.11)
Retic Count, Absolute: 52.7 10*3/uL (ref 19.0–186.0)

## 2010-04-17 LAB — URINALYSIS, MICROSCOPIC ONLY
Bilirubin Urine: NEGATIVE
Glucose, UA: NEGATIVE mg/dL
Specific Gravity, Urine: 1.008 (ref 1.005–1.030)
pH: 6.5 (ref 5.0–8.0)

## 2010-04-17 LAB — CULTURE, BLOOD (ROUTINE X 2): Culture: NO GROWTH

## 2010-04-17 LAB — CROSSMATCH
ABO/RH(D): B POS
Antibody Screen: NEGATIVE

## 2010-04-17 LAB — BASIC METABOLIC PANEL
BUN: 11 mg/dL (ref 6–23)
BUN: 16 mg/dL (ref 6–23)
BUN: 10 mg/dL (ref 6–23)
BUN: 5 mg/dL — ABNORMAL LOW (ref 6–23)
BUN: 8 mg/dL (ref 6–23)
CO2: 27 mEq/L (ref 19–32)
Calcium: 9 mg/dL (ref 8.4–10.5)
Calcium: 9 mg/dL (ref 8.4–10.5)
Calcium: 9.1 mg/dL (ref 8.4–10.5)
Calcium: 9.5 mg/dL (ref 8.4–10.5)
Chloride: 100 mEq/L (ref 96–112)
Creatinine, Ser: 0.91 mg/dL (ref 0.4–1.2)
GFR calc Af Amer: >60 mL/min (ref >60)
GFR calc non Af Amer: >60 mL/min (ref >60)
GFR calc non Af Amer: >60 mL/min (ref >60)
GFR calc non Af Amer: >60 mL/min (ref >60)
GFR calc non Af Amer: >60 mL/min (ref >60)
GFR calc non Af Amer: >60 mL/min (ref >60)
Glucose, Bld: 89 mg/dL (ref 70–99)
Glucose, Bld: 116 mg/dL — ABNORMAL HIGH (ref 70–99)
Glucose, Bld: 130 mg/dL — ABNORMAL HIGH (ref 70–99)
Glucose, Bld: 147 mg/dL — ABNORMAL HIGH (ref 70–99)
Glucose, Bld: 151 mg/dL — ABNORMAL HIGH (ref 70–99)
Potassium: 3.1 mEq/L — ABNORMAL LOW (ref 3.5–5.1)
Potassium: 4.8 mEq/L (ref 3.5–5.1)
Potassium: 6.1 mEq/L — ABNORMAL HIGH (ref 3.5–5.1)
Sodium: 134 mEq/L — ABNORMAL LOW (ref 135–145)
Sodium: 136 mEq/L (ref 135–145)

## 2010-04-17 LAB — RAPID URINE DRUG SCREEN, HOSP PERFORMED
Amphetamines: NOT DETECTED
Barbiturates: NOT DETECTED
Benzodiazepines: POSITIVE — AB
Opiates: NOT DETECTED
Tetrahydrocannabinol: POSITIVE — AB

## 2010-04-17 LAB — POCT I-STAT 3, ART BLOOD GAS (G3+)
Acid-Base Excess: 1 mmol/L (ref 0.0–2.0)
Acid-Base Excess: 2 mmol/L (ref 0.0–2.0)
Acid-Base Excess: 2 mmol/L (ref 0.0–2.0)
Acid-base deficit: 5 mmol/L — ABNORMAL HIGH (ref 0.0–2.0)
Bicarbonate: 20.8 mEq/L (ref 20.0–24.0)
Bicarbonate: 24.7 mEq/L — ABNORMAL HIGH (ref 20.0–24.0)
Bicarbonate: 25.8 mEq/L — ABNORMAL HIGH (ref 20.0–24.0)
Bicarbonate: 27.5 mEq/L — ABNORMAL HIGH (ref 20.0–24.0)
Bicarbonate: 29.1 mEq/L — ABNORMAL HIGH (ref 20.0–24.0)
O2 Saturation: 97 %
O2 Saturation: 98 %
Patient temperature: 37
TCO2: 22 mmol/L (ref 0–100)
TCO2: 28 mmol/L (ref 0–100)
TCO2: 29 mmol/L (ref 0–100)
TCO2: 31 mmol/L (ref 0–100)
pCO2 arterial: 35.4 mmHg (ref 35.0–45.0)
pCO2 arterial: 40.2 mmHg (ref 35.0–45.0)
pCO2 arterial: 47.3 mmHg — ABNORMAL HIGH (ref 35.0–45.0)
pCO2 arterial: 59.7 mmHg (ref 35.0–45.0)
pH, Arterial: 7.351 (ref 7.350–7.400)
pH, Arterial: 7.352 (ref 7.350–7.400)
pH, Arterial: 7.45 — ABNORMAL HIGH (ref 7.350–7.400)
pO2, Arterial: 117 mmHg — ABNORMAL HIGH (ref 80.0–100.0)
pO2, Arterial: 132 mmHg — ABNORMAL HIGH (ref 80.0–100.0)
pO2, Arterial: 139 mmHg — ABNORMAL HIGH (ref 80.0–100.0)
pO2, Arterial: 204 mmHg — ABNORMAL HIGH (ref 80.0–100.0)
pO2, Arterial: 67 mmHg — ABNORMAL LOW (ref 80.0–100.0)
pO2, Arterial: 89 mmHg (ref 80.0–100.0)

## 2010-04-17 LAB — RENAL FUNCTION PANEL
Albumin: 2.5 g/dL — ABNORMAL LOW (ref 3.5–5.2)
Albumin: 2.6 g/dL — ABNORMAL LOW (ref 3.5–5.2)
BUN: 24 mg/dL — ABNORMAL HIGH (ref 6–23)
BUN: 17 mg/dL (ref 6–23)
CO2: 30 mEq/L (ref 19–32)
CO2: 25 mEq/L (ref 19–32)
Calcium: 9 mg/dL (ref 8.4–10.5)
Calcium: 9.2 mg/dL (ref 8.4–10.5)
Calcium: 8.8 mg/dL (ref 8.4–10.5)
Chloride: 105 mEq/L (ref 96–112)
Chloride: 106 mEq/L (ref 96–112)
Creatinine, Ser: 0.82 mg/dL (ref 0.4–1.2)
Creatinine, Ser: 0.84 mg/dL (ref 0.4–1.2)
Creatinine, Ser: 0.74 mg/dL (ref 0.4–1.2)
Creatinine, Ser: 0.77 mg/dL (ref 0.4–1.2)
GFR calc Af Amer: >60 mL/min (ref >60)
GFR calc Af Amer: >60 mL/min (ref >60)
GFR calc non Af Amer: >60 mL/min (ref >60)
GFR calc non Af Amer: >60 mL/min (ref >60)
Glucose, Bld: 126 mg/dL — ABNORMAL HIGH (ref 70–99)
Phosphorus: 3.6 mg/dL (ref 2.3–4.6)
Phosphorus: 2.3 mg/dL (ref 2.3–4.6)
Potassium: 3.6 mEq/L (ref 3.5–5.1)
Sodium: 136 mEq/L (ref 135–145)

## 2010-04-17 LAB — DIFFERENTIAL
Basophils Absolute: 0 10*3/uL (ref 0.0–0.1)
Basophils Absolute: 0 10*3/uL (ref 0.0–0.1)
Basophils Absolute: 0 10*3/uL (ref 0.0–0.1)
Eosinophils Absolute: 0.1 10*3/uL (ref 0.0–0.7)
Eosinophils Relative: 0 % (ref 0–5)
Lymphocytes Relative: 10 % — ABNORMAL LOW (ref 12–46)
Lymphocytes Relative: 6 % — ABNORMAL LOW (ref 12–46)
Lymphocytes Relative: 7 % — ABNORMAL LOW (ref 12–46)
Lymphs Abs: 0.9 10*3/uL (ref 0.7–4.0)
Lymphs Abs: 0.9 10*3/uL (ref 0.7–4.0)
Lymphs Abs: 1 10*3/uL (ref 0.7–4.0)
Monocytes Absolute: 0.1 10*3/uL (ref 0.1–1.0)
Monocytes Relative: 5 % (ref 3–12)
Neutro Abs: 13.1 10*3/uL — ABNORMAL HIGH (ref 1.7–7.7)
Neutro Abs: 13.6 10*3/uL — ABNORMAL HIGH (ref 1.7–7.7)
Neutrophils Relative %: 92 % — ABNORMAL HIGH (ref 43–77)

## 2010-04-17 LAB — POCT CARDIAC MARKERS
CKMB, poc: <1 ng/mL — ABNORMAL LOW (ref 1.0–8.0)
Troponin i, poc: <0.05 ng/mL (ref 0.00–0.09)

## 2010-04-17 LAB — LEGIONELLA ANTIGEN, URINE: Legionella Antigen, Urine: NEGATIVE

## 2010-04-17 LAB — MAGNESIUM
Magnesium: 2.2 mg/dL (ref 1.5–2.5)
Magnesium: 2.7 mg/dL — ABNORMAL HIGH (ref 1.5–2.5)

## 2010-04-17 LAB — LACTATE DEHYDROGENASE: LDH: 122 U/L (ref 94–250)

## 2010-04-17 LAB — HIV ANTIBODY (ROUTINE TESTING W REFLEX): HIV: NONREACTIVE

## 2010-04-17 LAB — FOLATE: Folate: 12.4 ng/mL

## 2010-04-17 LAB — CARDIAC PANEL(CRET KIN+CKTOT+MB+TROPI)
CK, MB: 6.5 ng/mL — ABNORMAL HIGH (ref 0.3–4.0)
Troponin I: 0.01 ng/mL (ref 0.00–0.06)

## 2010-04-17 LAB — POCT PREGNANCY, URINE: Preg Test, Ur: NEGATIVE

## 2010-04-17 LAB — FERRITIN: Ferritin: 8 ng/mL — ABNORMAL LOW (ref 10–291)

## 2010-04-17 LAB — GLUCOSE, CAPILLARY
Glucose-Capillary: 190 mg/dL — ABNORMAL HIGH (ref 70–99)
Glucose-Capillary: 281 mg/dL — ABNORMAL HIGH (ref 70–99)

## 2010-04-17 LAB — COMPREHENSIVE METABOLIC PANEL
AST: 40 U/L — ABNORMAL HIGH (ref 0–37)
Alkaline Phosphatase: 65 U/L (ref 39–117)
BUN: 9 mg/dL (ref 6–23)
CO2: 22 mEq/L (ref 19–32)
Chloride: 104 mEq/L (ref 96–112)
Creatinine, Ser: 0.86 mg/dL (ref 0.4–1.2)
GFR calc Af Amer: >60 mL/min (ref >60)
GFR calc non Af Amer: >60 mL/min (ref >60)
Potassium: 3.7 mEq/L (ref 3.5–5.1)
Total Bilirubin: 0.4 mg/dL (ref 0.3–1.2)

## 2010-04-17 LAB — IRON AND TIBC
Iron: 18 ug/dL — ABNORMAL LOW (ref 42–135)
TIBC: 433 ug/dL (ref 250–470)

## 2010-04-17 LAB — BRAIN NATRIURETIC PEPTIDE: Pro B Natriuretic peptide (BNP): 159 pg/mL — ABNORMAL HIGH (ref 0.0–100.0)

## 2010-04-17 LAB — URINE CULTURE

## 2010-04-17 LAB — PROTIME-INR
INR: 0.9 (ref 0.00–1.49)
Prothrombin Time: 12.7 seconds (ref 11.6–15.2)

## 2010-04-17 LAB — HEPARIN LEVEL (UNFRACTIONATED)
Heparin Unfractionated: <0.1 IU/mL — ABNORMAL LOW (ref 0.30–0.70)
Heparin Unfractionated: <0.1 IU/mL — ABNORMAL LOW (ref 0.30–0.70)

## 2010-04-17 LAB — SEDIMENTATION RATE: Sed Rate: 70 mm/hr — ABNORMAL HIGH (ref 0–22)

## 2010-04-17 LAB — HEPATITIS PANEL, ACUTE: Hep A IgM: NEGATIVE

## 2010-04-17 LAB — APTT: aPTT: 28 seconds (ref 24–37)

## 2010-05-23 NOTE — Consult Note (Signed)
NAMEARNOLD, DEPINTO NO.:  [id number]   MEDICAL RECORD NO.:  [id number]          PATIENT TYPE:  INP   LOCATION:  2111                         FACILITY:  [facility]   PHYSICIAN:  Felipa Evener, MD  DATE OF BIRTH:  Mar 30, 1975   DATE OF CONSULTATION:  DATE OF DISCHARGE:                                 CONSULTATION   IDENTIFICATION:  The patient is a 35 year old female with past medical  history significant for asthma as a childhood that has required steroids  only once in the past 3 years, but hospitalized for only once in the  past 3 years and has been fairly benign.  She has been taking only  albuterol for it with no inhaled steroids, no pulmonology that has been  following her.  She reports that she started having symptoms for x1 week  mostly shortness of breath and wheezing with some night sweats.  No  fevers and no weight loss.  The patient finally decided to come to the  hospital today after her symptoms continuously deteriorated and Internal  Medicine Teaching Service was called to admit the patient.  Upon  evaluating her, the patient was wheezing significantly with respiratory  rate was in the mid 30s and decision was made to call critical care for  consultation.  Upon evaluating the patient, she is speaking in 1 or 2  sentences.  She is in tripod position.  She is visibly in respiratory  distress with audible wheezing across the room.  The patient has not  been on oxygen at home.  She report that she has never been intubated  for her asthma before, but does report significant psychiatric history  with depression and bipolar disorder.  The patient has had no exposure  and no recent travel.   PAST MEDICAL HISTORY:  1. Significant for depression.  2. Bipolar disorder.  3. Chronic back pain.  4. Asthma.   PAST SURGICAL HISTORY:  None.   FAMILY HISTORY:  Noncontributory.   SOCIAL HISTORY:  She is smoker of both cigarettes and marijuana.  Denies  any  alcohol or any other drug use.   MEDICATIONS:  Cymbalta, Seroquel, albuterol, and Tylenol for her back  pain.   ALLERGIES:  PEANUTS.   REVIEW OF SYSTEMS:  A 12-point review of systems is unobtainable as the  patient is visibly respiratory distress and appears unstable to answer  most of the question.   PHYSICAL EXAMINATION:  GENERAL:  A 35 year old [place] American female  with a clear respiratory distress.  The patient is unable to speak in  full sentences and is audibly wheezing from across the room.  VITAL SIGNS:  Afebrile with a heart rate is up 130, blood pressure  148/88, and saturation of 98% on 50% mask.  HEENT:  Normocephalic and atraumatic.  Pupils, equal, round, and  reactive to light.  Extraocular movements are intact.  Oral and nasal  mucosa are dry.  LUNGS:  Audible wheezing in all lung fields and decreased air movement  at the bases.  HEART:  Regular and tachycardic with normal S1 and S2.  No murmurs,  rubs, or gallops appreciated.  ABDOMEN:  Soft, nontender, and nondistended.  Positive bowel sounds.  EXTREMITIES:  No edema.  No tenderness appreciated.  NEUROLOGIC:  Grossly intact.   LABORATORY STUDIES:  Reviewed significant for chest x-ray showing some  evidence of pulmonary edema and some hyperinflation.  A pH of 7.32, CO2  of 40, PO2 of 80, and bicarb of 21.   ASSESSMENT AND PLAN:  The patient is 35 year old female with past  medical history significant for exacerbation of asthma.  No evidence of  infection at this point.  No evidence of any exposure or new material in  terms of her environment that could possibly justify her symptoms.  She  is under visible respiratory distress.  The ABG is obviously very  alarming and the CO2 is normalized.  The patient is starting to develop  the potential lactic acidosis due to all her increased respiratory  muscle oxygen demand.  With the PO2 of 80 with A gradient of 226, while  the patient is on 50% mask.  Therefore at  this point, PE is certainly  possibility.  We would recommend the D-dimer would not perform CT  angiogram at this point, we would fully anticoagulate the patient given  her smoking and her birth control usage.  I will admit the patient to  intensive care unit and place her on CPAP of 5 cm of water and attempt  to titrate for comfort; however, concern that the patient would develop  respiratory failure.  The hope is to be able to get her on CPAP long  enough to get her to the intensive care unit.  We will able to secure  her airway in a more permanent manner.  We would also recommend Solu-  Medrol 125 mg q.6 h for 4 doses with 80 mg IV q.6 h. for 8 doses then  reevaluate, Avelox, given albuterol q.4 h. scheduled q.2 h. p.r.n.,  titration of oxygen for saturation of 90% to 95% and we will recommend a  full set of labs as well as cultures and we will followup on labs.      Felipa Evener, MD  Electronically Signed     WJY/MEDQ  D:  06/18/2008  T:  06/19/2008  Job:  [id number]

## 2010-05-23 NOTE — Discharge Summary (Signed)
Rebecca Holmes, Rebecca Holmes NO.:  [id number]   MEDICAL RECORD NO.:  [id number]          PATIENT TYPE:  INP   LOCATION:  3036                         FACILITY:  [facility]   PHYSICIAN:  Mick Sell, MD DATE OF BIRTH:  November 12, 1975   DATE OF ADMISSION:  06/18/2008  DATE OF DISCHARGE:  06/25/2008                               DISCHARGE SUMMARY   DISCHARGE DIAGNOSES:  1. Ventilatory-dependent respiratory failure, secondary to status      asthmaticus, resolved, to follow up with outpatient clinic.  2. Bronchial asthma, starting maintenance therapy with Advair, to      finish full prednisone taper.  3. Low ejection fraction, ejection fraction 20% 25% in the setting of      ventilatory-dependent respiratory failure, unclear etiology, to get      a repeat 2D echocardiogram in 6 to 8 weeks as an outpatient.  4. Iron-deficiency anemia, a ferritin of 8, status post 1 unit of      blood transfusion, hemoglobin stable, to continue oral      supplementation therapy.  5. Urinary tract infection, urine cultures growing 65,000 colonies of      E. coli, finished full course of antibiotic therapy.  6. Ongoing tobacco abuse.  7. Ongoing marijuana abuse.  8. Hypertension.  9. Leukocytosis, most likely secondary to steroid induced, resolving,      to follow up with outpatient clinic [facility].   DISCHARGE MEDICATIONS:  1. Ferrous sulfate 325 mg 1 pill 3 times a day.  2. Lisinopril 10 mg 1 pill once a day.  3. Albuterol 108 mcg 2 puffs inhaled every 4 to 6 hours as needed for      shortness of breath and wheezing.  4. Advair Diskus 250/50 mcg inhale 1 puff twice daily as directed.  5. Prednisone 10 mg 5 pills on June 26, 2008, and June 27, 2008, 4      pills on June 28, 2008, and June 29, 2008, 3 pills on June 30, 2008, and July 01, 2008, 2 pills on July 02, 2008, and July 03, 2008, 1 pill on July 04, 2008, and July 05, 2008, and then stop.  6. Norvasc 5 mg 1 pill  once a day.   DISPOSITION AND FOLLOWUP:  Patient is to follow up with outpatient  clinic at [facility] on July 13, 2008, with Dr. Comer Locket.  At the  time of hospital followup, please address her breathing and make sure  she finished a full course of prednisone therapy.  Also  make sure the  patient is taking Advair for her bronchial asthma maintenance therapy.   PROCEDURES PERFORMED:  1. Chest x-ray, June 18, 2008.  Impression:  Questionable early      infiltrates, right lower lobe.  2. CT angio chest.  Impression:  No evidence of acute pulmonary      embolism.  No atypical findings of tuberculosis, ground-glass      opacity medially in the right upper lobe may reflect an early focal  infiltrate.  There is diffuse central airway thickening.  3. Chest x-ray on June 22, 2008.  Impression:  Stable support      apparatus and no focal airspace disease.  4. A 2D echocardiogram done on June 20, 2008.  Impression:  The      cavity's wall thickness was normal.  Systolic function was severely      reduced.  Estimated ejection fraction was in the range of 20% to      25%.  Diffuse hypokinesis.  5. CBG on the day of admission, pH 7.32, pCO2 of 39.9, pO2 of 80, pCO2      of 22.  D-dimer is 0.62.  Urine pregnancy test is negative.  ESR is      70.  Urinalysis is positive for nitrites and small amounts of      leukocytes and 11 to 20 WBC.  Alcohol level is 90.  LDH is 122.      Urine streptococcal antigen is negative.  Lactic acid is 3.1.      Anemia panel, serum iron 18, percent saturation is 4%, ferritin is      8.  Haptoglobin is 267.  HIV antibody is nonreactive.  Urine drug      screen is positive for benzodiazepines and tetrahydrocannabinol.      BNP is 159.  Acute hepatitis panel is negative.  Urine cultures      positive for 65,000 colonies of E. coli, pansensitive.  Urine      Legionella is negative.  Antinuclear antibodies positive and titer      is 1 inch to 40.    CONSULTATIONS:  Pulmonary critical care.   BRIEF ADMITTING HISTORY AND PHYSICAL:  A 35 year old African American  lady with a past medical history of bronchial asthma, ongoing tobacco  abuse, was brought to the ER in the morning around 7 o'clock.  History  is as per patient's grandmother and patient's mother.  Patient has not  been doing well for the last 2 months and has been using albuterol  inhaler more frequently but over the last 2 weeks she has been feeling  short of breath, subjective fevers, and chills but she has not seen her  primary care physician.  The day prior to admission at 12 o'clock in the  midnight she was feeling really short of breath and she could not go to  sleep.  She had a hot shower and used albuterol.  At 4 o'clock in the  morning, she had another albuterol and went to sleep.  Patient woke up  on the morning of admission at 7 o'clock complaining of severe shortness  of breath, not even able to talk when they brought her to the emergency.  Family denies any exposure to sick contacts or recent travel.  They deny  any other complaints.   PHYSICAL EXAMINATION:  Blood pressure 155/95.  Pulse rate of 116.  Respiratory rate 38.  Oxygen saturation 99% on 4 L.  GENERAL:  Patient was seated in tripod position in respiratory distress  with facemask on.  ENT:  Moist mucous membranes.  Fair dentition.  NECK:  Expiratory wheezing was heard in the neck.  RESPIRATORY:  Bilaterally diffuse inspiratory and expiratory wheezing  with poor air movement, use of accessory muscles with bilateral  decreased expansion.  Can speak up to 2 words at a time.  S1 and S2  tachycardiac up to 130s.  GI:  Abdomen is soft, nondistended, nontender.  No guarding.  No  rigidity.  Bowel sounds are positive.  EXTREMITIES:  No pedal edema.  SKIN:  Warm and dry.  LYMPH:  No peripheral lymphadenoma.  PSYCH:  She is appropriately anxious.  NEUROLOGICAL:  Nonfocal.   LABS ON ADMISSION:  CBC, white  count 14.7, hemoglobin 9.2, hematocrit  30.4, platelet count 654.  Comprehensive metabolic panel, sodium 137,  potassium 3.7, chloride 104, bicarb 22, glucose 116, BUN 9, creatinine  0.86, total bilirubin 0.4, alkaline phosphatase 65, AST 40, ALT 28,  total protein 8.6, albumin 3.9, calcium 9.3.   HOSPITAL COURSE BY PROBLEM:  1. Ventilatory-dependent respiratory failure secondary to status      asthmaticus.  Patient was initially admitted to medical ICU and was      intubated and kept on sedation protocol.  Given her chest x-ray      findings of infiltrates, we started her on broad-spectrum      antibiotics.  Patient was intubated for the next 3 days and was      diffusely wheezing at which time critical care considered her      sedation protocol to propofol drip.  Patient over the next few days      has slightly gotten better and was successfully extubated and got      transferred from the ICU to a regular bed.  We initially started on      Solu-Medrol and later transitioned to oral prednisone therapy.  We      started her on Advair for the maintenance therapy.  Patient is to      take Advair along with albuterol as needed and is to finish full      course prednisone taper and is to follow up with outpatient clinic      [facility].  2. Low ejection fraction.  Patient did not have any changes on the EKG      suspicious for any acute ischemia but patient's 2D echocardiogram      does show diffuse hypokinesis with an ejection fraction 20% to 25%.      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  Patient is to get a 2D echocardiogram in 6 to 8      months and if the ejection fraction still continues to be running      low an outpatient Cardiology referral would be beneficial.  3. Hypertension.  Given patient's history of low ejection fraction, we      will start a low  dose of calcium channel blocker, as well as an ACE      inhibitor.  Patient is to follow up with outpatient clinic [facility].  If the patient's blood pressure is well tolerated,      would recommend stopping her hypertensive medications.  4. Ongoing tobacco abuse.  We counseled her regarding tobacco      cessation and patient currently looks very motivated to completely      stop smoking.  Patient is to follow up with outpatient clinic [facility].   DISCHARGE VITALS:  Temperature 98.5.  Pulse rate of 86.  Respiratory  rate.  Blood pressure 132/85.  Oxygen saturation 100% on room air.   DISCHARGED LABS:  White count 13.6, hemoglobin 9.9, platelet count 222.  Basic metabolic panel, sodium 136, potassium 3.1 (repleted),  chloride  100, bicarb 26, glucose 89, BUN 11, creatinine 0.77, calcium 9.4.  Patient on the day of discharge is oriented x3 and is not in any acute  distress.  Needs to take all the medications as mentioned in the  discharge instructions and is to follow up with outpatient clinic [facility].      [name], MD  Electronically Signed      Mick Sell, MD  Electronically Signed    VB/MEDQ  D:  07/13/2008  T:  07/13/2008  Job:  [id number]   cc:   Outpatient Clinic, [facility]

## 2010-05-26 NOTE — Discharge Summary (Signed)
NAMEMarland Kitchen  Rebecca Holmes, Rebecca Holmes                          ACCOUNT NO.:  [id number]   MEDICAL RECORD NO.:  [id number]                   PATIENT TYPE:  INP   LOCATION:  9123                                 FACILITY:  [facility]   PHYSICIAN:  Rudy Jew. Ashley Royalty, M.D.             DATE OF BIRTH:  11/25/1975   DATE OF ADMISSION:  06/17/2003  DATE OF DISCHARGE:  06/20/2003                                 DISCHARGE SUMMARY   DISCHARGE DIAGNOSES:  1. Intrauterine pregnancy at term, delivered.  2. Term birth living child, vertex.  3. Polyhydramnios.  4. Group B streptococcus carrier.  5. Asthma.  6. Smoker.   OPERATIONS AND SPECIAL PROCEDURES:  OB delivery without any episiotomy or  laceration.   CONSULTATIONS:  None.   DISCHARGE MEDICATIONS:  OTC Motrin.   HISTORY AND PHYSICAL:  This is a 35 year old gravida 5 para 3-1-0-3 at 40  weeks 1 day gestation and aforementioned risk factors  The patient presented  in labor.  For the remainder of the history and physical please see chart.   HOSPITAL COURSE:  The patient was admitted to [facility] of  [place].  Admission laboratory studies were drawn.  On June 18, 2003 she  delivered a 6-pound 5-ounce female, Apgars 1 at one minute, 7 at five minutes,  sent to the newborn nursery.  There was a nuchal cord x1 - loose.  DeLee  suction was used on the perineum due to meconium-stained amniotic fluid.  The pediatrics team was present at delivery.  The infant's initial tone was  very poor.  Arterial cord pH was 7.18.  Cord diameter was noted to be small  and the placenta and membranes were submitted to pathology for histologic  studies.  The infant was intubated per pediatrics.  There was no meconium  noted below the cords.  The patient's postpartum course was benign and she  was discharged home on postpartum day #2 afebrile and in satisfactory  condition.   ACCESSORY CLINICAL FINDINGS:  Hemoglobin and hematocrit on admission were  8.4 and 26.5 respectively.   Repeat values were obtained June 19, 2003 and  were 7.2 and 22.6 respectively.  This was asymptomatic.   DISPOSITION:  The patient is to return to [facility]  in 4-6 weeks for postpartum evaluation.  As she was interested in a tubal  sterilization procedure this will be discussed and scheduled at that time.                                               James A. Ashley Royalty, M.D.    JAM/MEDQ  D:  06/30/2003  T:  07/02/2003  Job:  [id number]

## 2010-06-15 ENCOUNTER — Encounter: Payer: Medicaid Other | Admitting: Internal Medicine

## 2010-06-29 ENCOUNTER — Other Ambulatory Visit (HOSPITAL_COMMUNITY)
Admission: RE | Admit: 2010-06-29 | Discharge: 2010-06-29 | Disposition: A | Discharge status: Home / Self Care | Payer: Medicaid Other | Source: Ambulatory Visit | Attending: Internal Medicine | Admitting: Internal Medicine

## 2010-06-29 ENCOUNTER — Ambulatory Visit (INDEPENDENT_AMBULATORY_CARE_PROVIDER_SITE_OTHER): Payer: Medicaid Other | Admitting: Internal Medicine

## 2010-06-29 ENCOUNTER — Encounter: Payer: Self-pay | Admitting: Internal Medicine

## 2010-06-29 DIAGNOSIS — Z0000 Encounter for general adult medical examination without abnormal findings: Secondary | ICD-10-CM

## 2010-06-29 DIAGNOSIS — Z309 Encounter for contraceptive management, unspecified: Secondary | ICD-10-CM

## 2010-06-29 DIAGNOSIS — M542 Cervicalgia: Secondary | ICD-10-CM

## 2010-06-29 DIAGNOSIS — I2510 Atherosclerotic heart disease of native coronary artery without angina pectoris: Secondary | ICD-10-CM

## 2010-06-29 DIAGNOSIS — F329 Major depressive disorder, single episode, unspecified: Secondary | ICD-10-CM

## 2010-06-29 DIAGNOSIS — M549 Dorsalgia, unspecified: Secondary | ICD-10-CM

## 2010-06-29 DIAGNOSIS — Z3042 Encounter for surveillance of injectable contraceptive: Secondary | ICD-10-CM

## 2010-06-29 DIAGNOSIS — J45909 Unspecified asthma, uncomplicated: Secondary | ICD-10-CM

## 2010-06-29 DIAGNOSIS — Z01419 Encounter for gynecological examination (general) (routine) without abnormal findings: Secondary | ICD-10-CM | POA: Insufficient documentation

## 2010-06-29 DIAGNOSIS — I10 Essential (primary) hypertension: Secondary | ICD-10-CM

## 2010-06-29 DIAGNOSIS — IMO0001 Reserved for inherently not codable concepts without codable children: Secondary | ICD-10-CM

## 2010-06-29 DIAGNOSIS — N898 Other specified noninflammatory disorders of vagina: Secondary | ICD-10-CM

## 2010-06-29 DIAGNOSIS — D509 Iron deficiency anemia, unspecified: Secondary | ICD-10-CM

## 2010-06-29 DIAGNOSIS — Z3049 Encounter for surveillance of other contraceptives: Secondary | ICD-10-CM

## 2010-06-29 MED ORDER — MEDROXYPROGESTERONE ACETATE 150 MG/ML IM SUSP
150.0000 mg | Freq: Once | INTRAMUSCULAR | Status: AC
  Administered 2010-06-29: 150 mg via INTRAMUSCULAR

## 2010-06-29 MED ORDER — FERROUS SULFATE 325 (65 FE) MG PO TABS: 325.0000 mg | ORAL_TABLET | Freq: Three times a day (TID) | ORAL | Status: DC

## 2010-06-29 MED ORDER — FLUTICASONE-SALMETEROL 250-50 MCG/DOSE IN AEPB: 2.0000 | INHALATION_SPRAY | Freq: Two times a day (BID) | RESPIRATORY_TRACT | Status: DC

## 2010-06-29 MED ORDER — TETANUS-DIPHTH-ACELL PERTUSSIS 5-2-15.5 LF-MCG/0.5 IM SUSP
0.5000 mL | Freq: Once | INTRAMUSCULAR | Status: AC
  Administered 2010-06-29: 0.5 mL via INTRAMUSCULAR

## 2010-06-29 MED ORDER — QUETIAPINE FUMARATE 50 MG PO TABS
50.0000 mg | ORAL_TABLET | Freq: Every day | ORAL | Status: DC
Start: 2010-06-29 — End: 2011-12-25

## 2010-06-29 MED ORDER — ALBUTEROL SULFATE HFA 108 (90 BASE) MCG/ACT IN AERS: 2.0000 | INHALATION_SPRAY | Freq: Four times a day (QID) | RESPIRATORY_TRACT | Status: DC | PRN

## 2010-06-29 MED ORDER — DULOXETINE HCL 60 MG PO CPEP: 60.0000 mg | ORAL_CAPSULE | Freq: Every day | ORAL | Status: DC

## 2010-06-29 MED ORDER — CYCLOBENZAPRINE HCL 10 MG PO TABS: 10.0000 mg | ORAL_TABLET | Freq: Two times a day (BID) | ORAL | Status: DC | PRN

## 2010-06-29 MED ORDER — VALSARTAN 160 MG PO TABS: 160.0000 mg | ORAL_TABLET | Freq: Every day | ORAL | Status: DC

## 2010-06-29 NOTE — Patient Instructions (Addendum)
   Please follow-up at the clinic in 1-2 months, at which time we will reevaluate your neck pain, blood pressure.  If you have been started on new medication(s), and you develop throat closing, tongue swelling, rash, please stop the medication and call the clinic at [phone] and go to the ER.  If you are diabetic, please bring your meter to your next visit.  If symptoms worsen, or new symptoms arise, please call the clinic or go to the ER.  You will be called if there is something abnormal with your labs, and we will then prescribe medication or bring you in sooner if needed.  Please try to stop smoking.  Please bring all of your medications in a bag to your next visit.

## 2010-06-29 NOTE — Progress Notes (Signed)
Subjective:    Patient ID: Rebecca Holmes, female    DOB: Dec 21, 1975, 35 y.o.   MRN: [id number]  HPI Pt is a 35 y.o. female with a PMHx of HTN, iron- deficiency anemia, who presents to clinic today for the following:  1) HTN - Patient does not check blood pressure regularly at home.Currently taking Lostaran 160mg  daily. Out of meds x 2 weeks. Denies headaches, dizziness, lightheadedness, chest pain, shortness of breath.   2) Family planning - requesting depo shot today, has been on it since 2008. Is sexually active, 1 partner, no other form of contraception (possible multiple partners of her partner). No abn dc, lesions, itching. Not currently pregnant per her knowledge. Only intermittently has menstruation.  3) Left lateral neck pain - intermittent pain, 10-20 min at a time, feels like something being pinched. Alleviated by ibuprofen, aggravated by motion. Has been x 9 days. No recent trauma. No paresthesias, weakness of UE, radiation of pain.   4) Preventative health care - patient due for her PAP, Tdap, please see #2 regarding sexual health. Will plan to do today.  5) Depression - well controlled on current regimen without overt anhedonia, no suicidal ideation, no homicidal ideation, no feelings of isolation, no feelings of withdrawal.   Review of Systems Per HPI.  Current Outpatient Medications Medication Sig   . albuterol (PROVENTIL HFA;VENTOLIN HFA) 108 (90 BASE) MCG/ACT inhaler Inhale 2 puffs into the lungs every 6 (six) hours as needed for wheezing. Every 4-6 hours as needed for shortness of breath.  . ferrous sulfate 325 (65 FE) MG tablet Take 1 tablet (325 mg total) by mouth 3 (three) times daily with meals.  . Fluticasone-Salmeterol (ADVAIR DISKUS) 250-50 MCG/DOSE AEPB Inhale 2 puffs into the lungs every 12 (twelve) hours.  . cyclobenzaprine (FLEXERIL) 10 MG tablet Take 1 tablet (10 mg total) by mouth 2 (two) times daily as needed. For muscle cramps  . DULoxetine (CYMBALTA) 60 MG  capsule Take 1 capsule (60 mg total) by mouth daily.  . medroxyPROGESTERone (DEPO-PROVERA) 150 MG/ML injection Inject 150 mg into the muscle every 3 (three) months.    . QUEtiapine (SEROQUEL) 50 MG tablet Take 1 tablet (50 mg total) by mouth at bedtime.  . valsartan (DIOVAN) 160 MG tablet Take 1 tablet (160 mg total) by mouth daily.    Allergies Peanut-containing drug products  Past Medical History  Diagnosis Date  . Heart murmur   . Left ventricular dysfunction     Decreased; Low E 20-25% in the setting of severe illness requiring ICU care 06/2008; Cardiolyte 06/13/2009 normal perfusion w/o significant ischemia or scar; LVEF 38%  . Respiratory failure     Ventilator Dependent; 2/2 status asthmatics 6/11-18/2010 admit [facility]  . HTN (hypertension)     Try off ACE 06/13/2009 due to pseudoasthma  . Vision disorder   . Anemia, iron deficiency   . Asthma     vs Pseudoasthma from ACE; Trial off ACE > PFT's normal 09/09/09 x DLCO 59 > corrects to 73%  . Depression     Past Surgical History  Procedure Date  . Dilation and curettage of uterus        Objective:   Physical Exam General: Vital signs reviewed and noted. Well-developed, well-nourished, in no acute distress; alert, appropriate and cooperative throughout examination.  Head: Normocephalic, atraumatic.  Neck: No deformities, masses noted. Left lateral neck tenderness to palpation over insertion of trapezius muscle. Negative spurling sign. Muscle strength 5/5 BL upper extremities. No midline cervical  TTP.  Lungs:  Normal respiratory effort. Clear to auscultation BL without crackles or wheezes.  Heart: RRR. S1 and S2 normal without gallop, murmur, or rubs.  Abdomen:  BS normoactive. Soft, Nondistended, non-tender.  No masses or organomegaly.  Extremities: No pretibial edema.  Genital Exam: Inspection reveals no overt lesions, masses. Mild nonmalodorous discharge noted in vagina. Vault. Cervical os without friability.          Assessment & Plan:  Case and plan of care discussed with Dr. Tilford Pillar.

## 2010-06-30 LAB — WET PREP BY MOLECULAR PROBE: Gardnerella vaginalis: NEGATIVE

## 2010-07-16 ENCOUNTER — Encounter: Payer: Self-pay | Admitting: Internal Medicine

## 2010-07-18 ENCOUNTER — Encounter: Payer: Self-pay | Admitting: Internal Medicine

## 2010-07-18 DIAGNOSIS — Z3042 Encounter for surveillance of injectable contraceptive: Secondary | ICD-10-CM | POA: Insufficient documentation

## 2010-07-18 DIAGNOSIS — Z0000 Encounter for general adult medical examination without abnormal findings: Secondary | ICD-10-CM | POA: Insufficient documentation

## 2010-07-18 DIAGNOSIS — M542 Cervicalgia: Secondary | ICD-10-CM | POA: Insufficient documentation

## 2010-07-18 NOTE — Assessment & Plan Note (Signed)
Likely musculoskeletal pain, no evidence of cervical impingement or radiculopathy. No lymphadenopathy. - Rec PRN ibuprofen  - Rec monitor for radicular symptoms.  - If persistent symptoms despite anti inflammatories, will consider imaging.

## 2010-07-18 NOTE — Assessment & Plan Note (Signed)
No recent acute exacerbations. Refill meds today.

## 2010-07-18 NOTE — Assessment & Plan Note (Signed)
Well controlled. Continue current regimen. Refill meds today.

## 2010-07-18 NOTE — Assessment & Plan Note (Signed)
Depo shot today. 

## 2010-07-18 NOTE — Assessment & Plan Note (Addendum)
-   PAP today, check chlamydia, gc, wet prep as although pt is monogamous, she is unsure if her partner is, and is not using condoms. Encouraged condom usage if concern for infidelity. - Tdap today.

## 2010-07-18 NOTE — Assessment & Plan Note (Signed)
Refill iron tablets, check cbc, ferritin.

## 2010-09-29 ENCOUNTER — Ambulatory Visit (INDEPENDENT_AMBULATORY_CARE_PROVIDER_SITE_OTHER): Payer: Medicaid Other | Admitting: *Deleted

## 2010-09-29 DIAGNOSIS — Z3049 Encounter for surveillance of other contraceptives: Secondary | ICD-10-CM

## 2010-09-29 LAB — POCT URINE PREGNANCY: Preg Test, Ur: NEGATIVE

## 2010-09-29 MED ORDER — MEDROXYPROGESTERONE ACETATE 150 MG/ML IM SUSP
150.0000 mg | Freq: Once | INTRAMUSCULAR | Status: AC
  Administered 2010-09-29: 150 mg via INTRAMUSCULAR

## 2010-10-02 NOTE — Progress Notes (Signed)
Addended by: Priscella Mann on: 10/02/2010 10:29 AM   Modules accepted: Orders

## 2010-10-05 LAB — PREGNANCY, URINE: Preg Test, Ur: NEGATIVE

## 2011-05-06 IMAGING — CR DG CHEST 1V PORT
1 series · 1 of 1 positions shown · non-contrast
Comparison: Portable chest x-ray earlier today 3243 hours.

CLINICAL DATA: Intubation.  Central venous catheter placement.
Asthma exacerbation.

PORTABLE CHEST - 1 VIEW [DATE]/6919 1626 hours:

[AP]
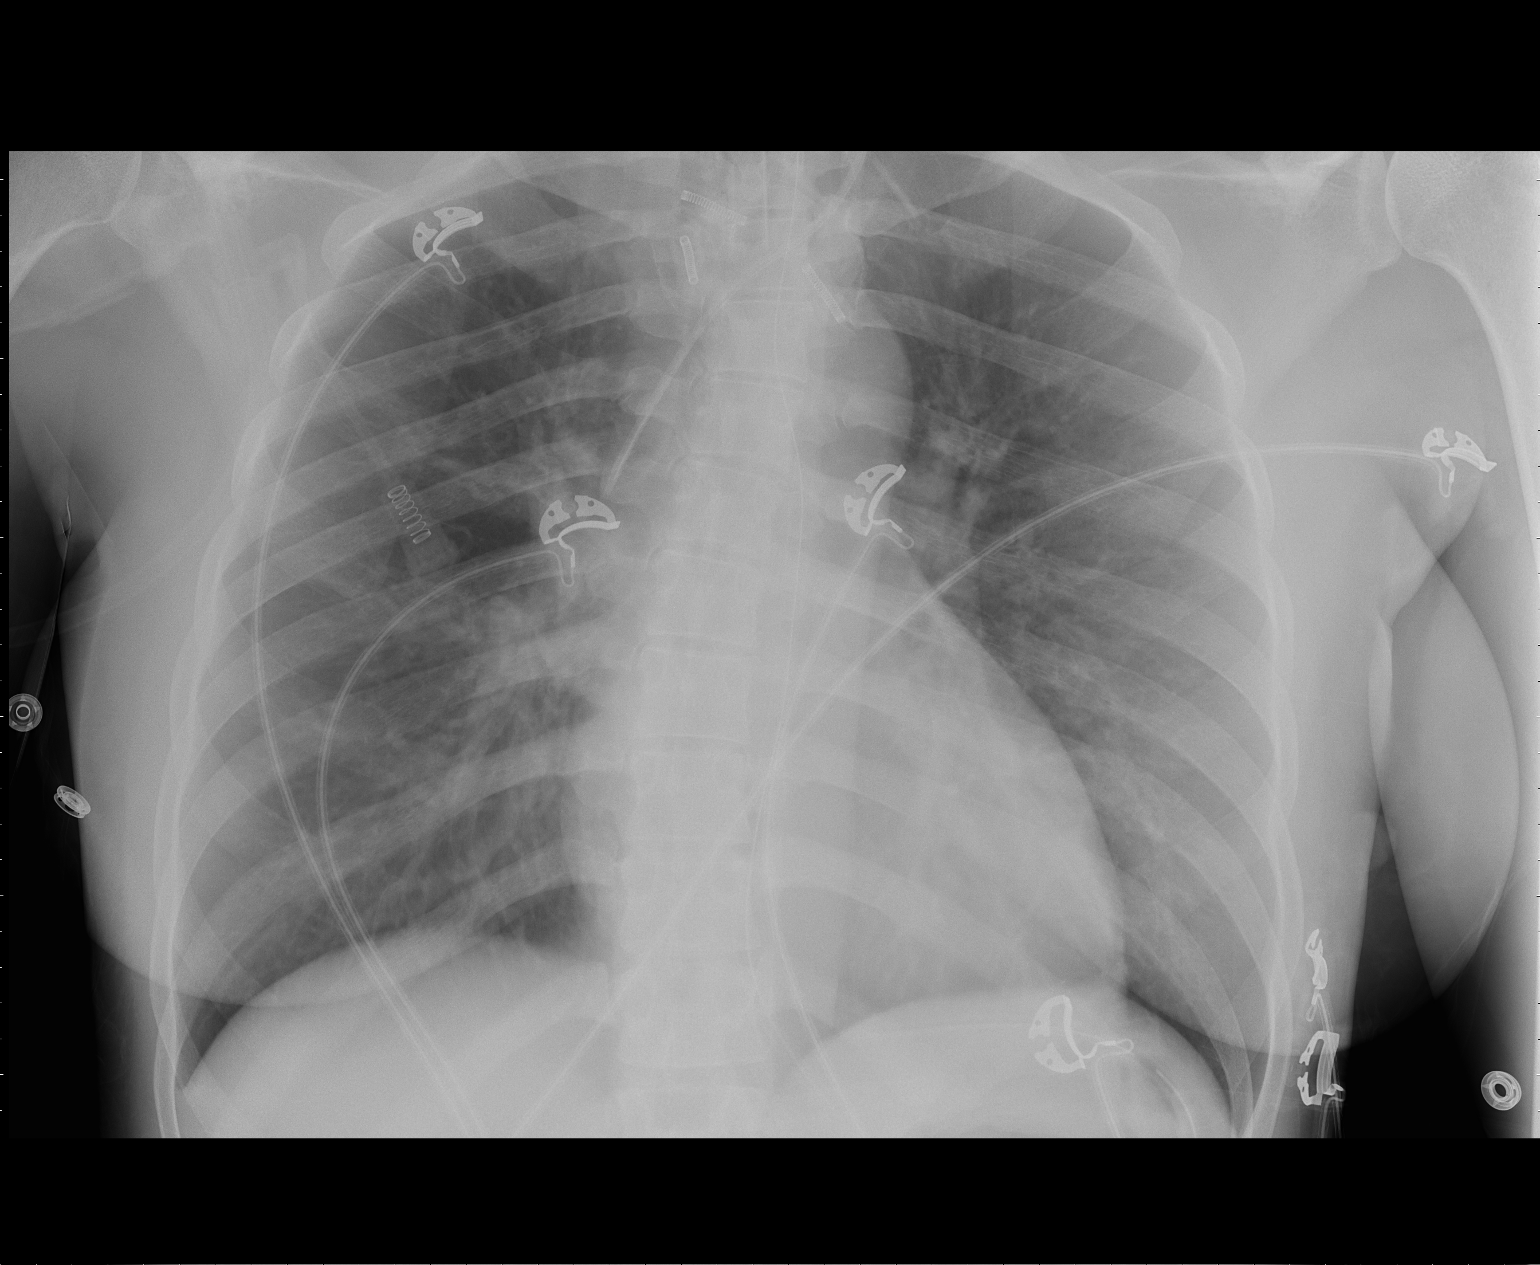

[1 of 1 positions shown; findings below may reference images not displayed]

FINDINGS: Endotracheal tube tip in satisfactory position
approximately 6 cm above the carina.  Left jugular central venous
catheter tip in the SVC.  No evidence of pneumothorax or
mediastinal hematoma.  Cardiomediastinal silhouette unremarkable
for technique, unchanged.  Lungs clear.  No pleural effusions.
IMPRESSION: 1.  Endotracheal tube tip in satisfactory position approximately 6
cm above the carina.
2.  Left jugular central venous catheter tip in the SVC.  No acute
complicating features.
3.  No acute cardiopulmonary disease.

## 2011-05-08 IMAGING — CR DG CHEST 1V PORT
1 series · 1 of 1 positions shown · non-contrast
Comparison: 06/19/2008

CLINICAL DATA: Respiratory distress

PORTABLE CHEST - 1 VIEW

[view not recorded]
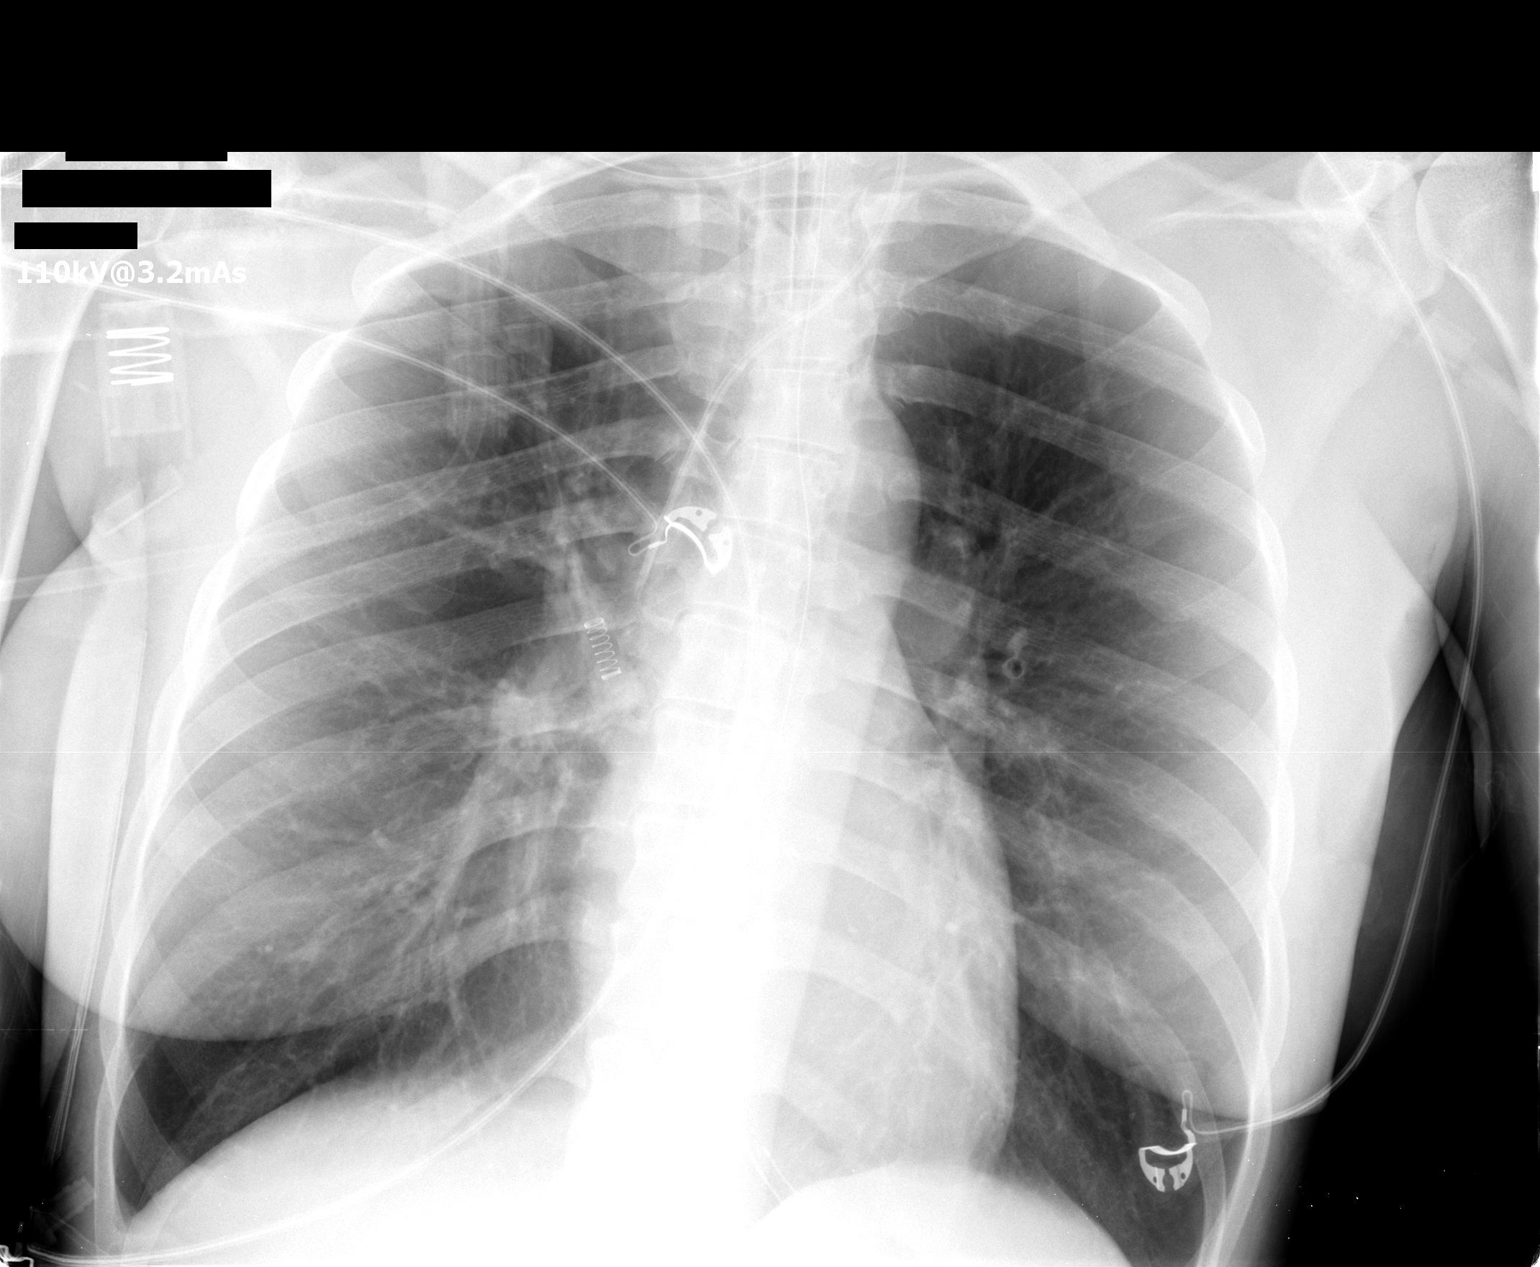

[1 of 1 positions shown; findings below may reference images not displayed]

FINDINGS: Endotracheal tube, nasogastric tube, and left IJ central
line stable position.  Lungs are clear.  Heart size normal.  No
effusion although the left lateral costophrenic angle is excluded.
Visualized bones unremarkable.
IMPRESSION: 1.  No acute disease.
2. Support hardware stable in position.

## 2011-05-09 IMAGING — CR DG CHEST 1V PORT
1 series · 1 of 1 positions shown · non-contrast
Comparison: Chest 06/20/2008.

CLINICAL DATA: Respiratory distress.

PORTABLE CHEST - 1 VIEW

[AP]
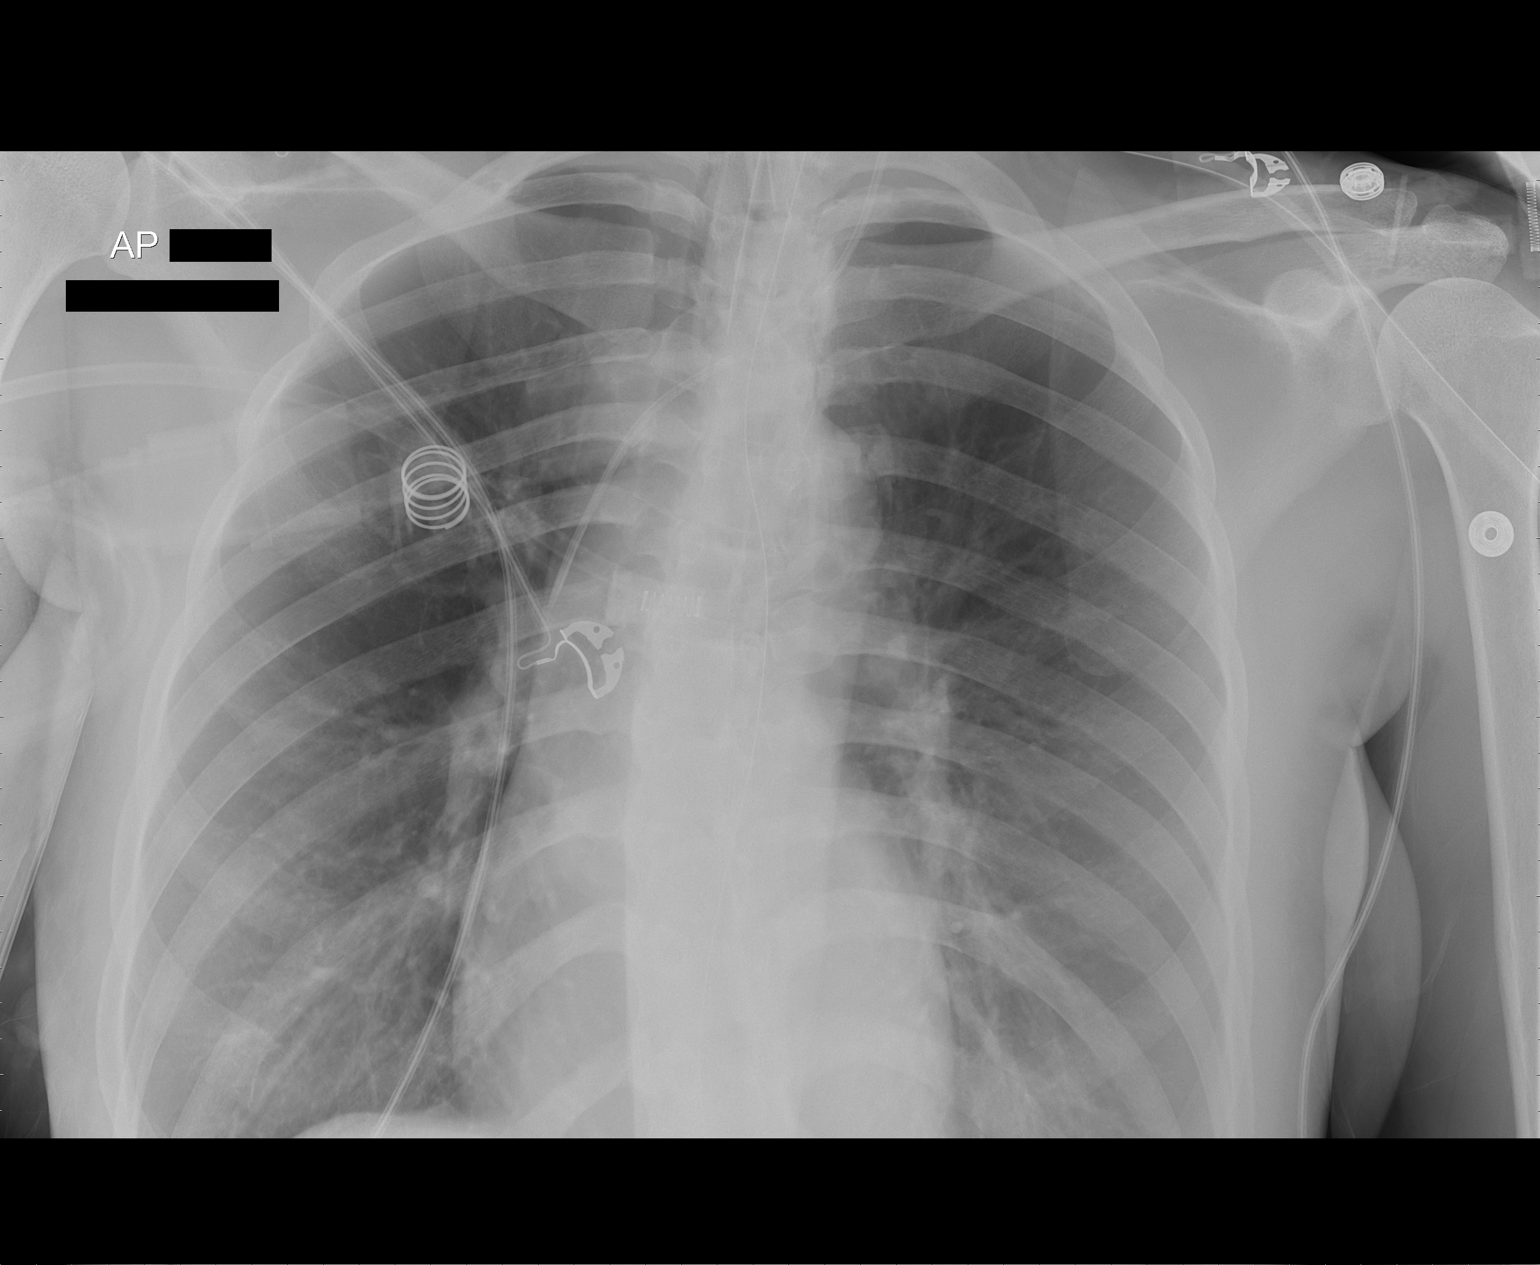

[1 of 1 positions shown; findings below may reference images not displayed]

FINDINGS: Support tubes and lines are unchanged.  The chest is
hyperexpanded but the lungs are clear.  No effusion.  Heart size
normal.
IMPRESSION: No interval change.

## 2011-05-10 IMAGING — CR DG CHEST 1V PORT
1 series · 1 of 1 positions shown · non-contrast
Comparison: 06/21/2008

CLINICAL DATA: 32-year-old with asthma attack.

PORTABLE CHEST - 1 VIEW

[AP]
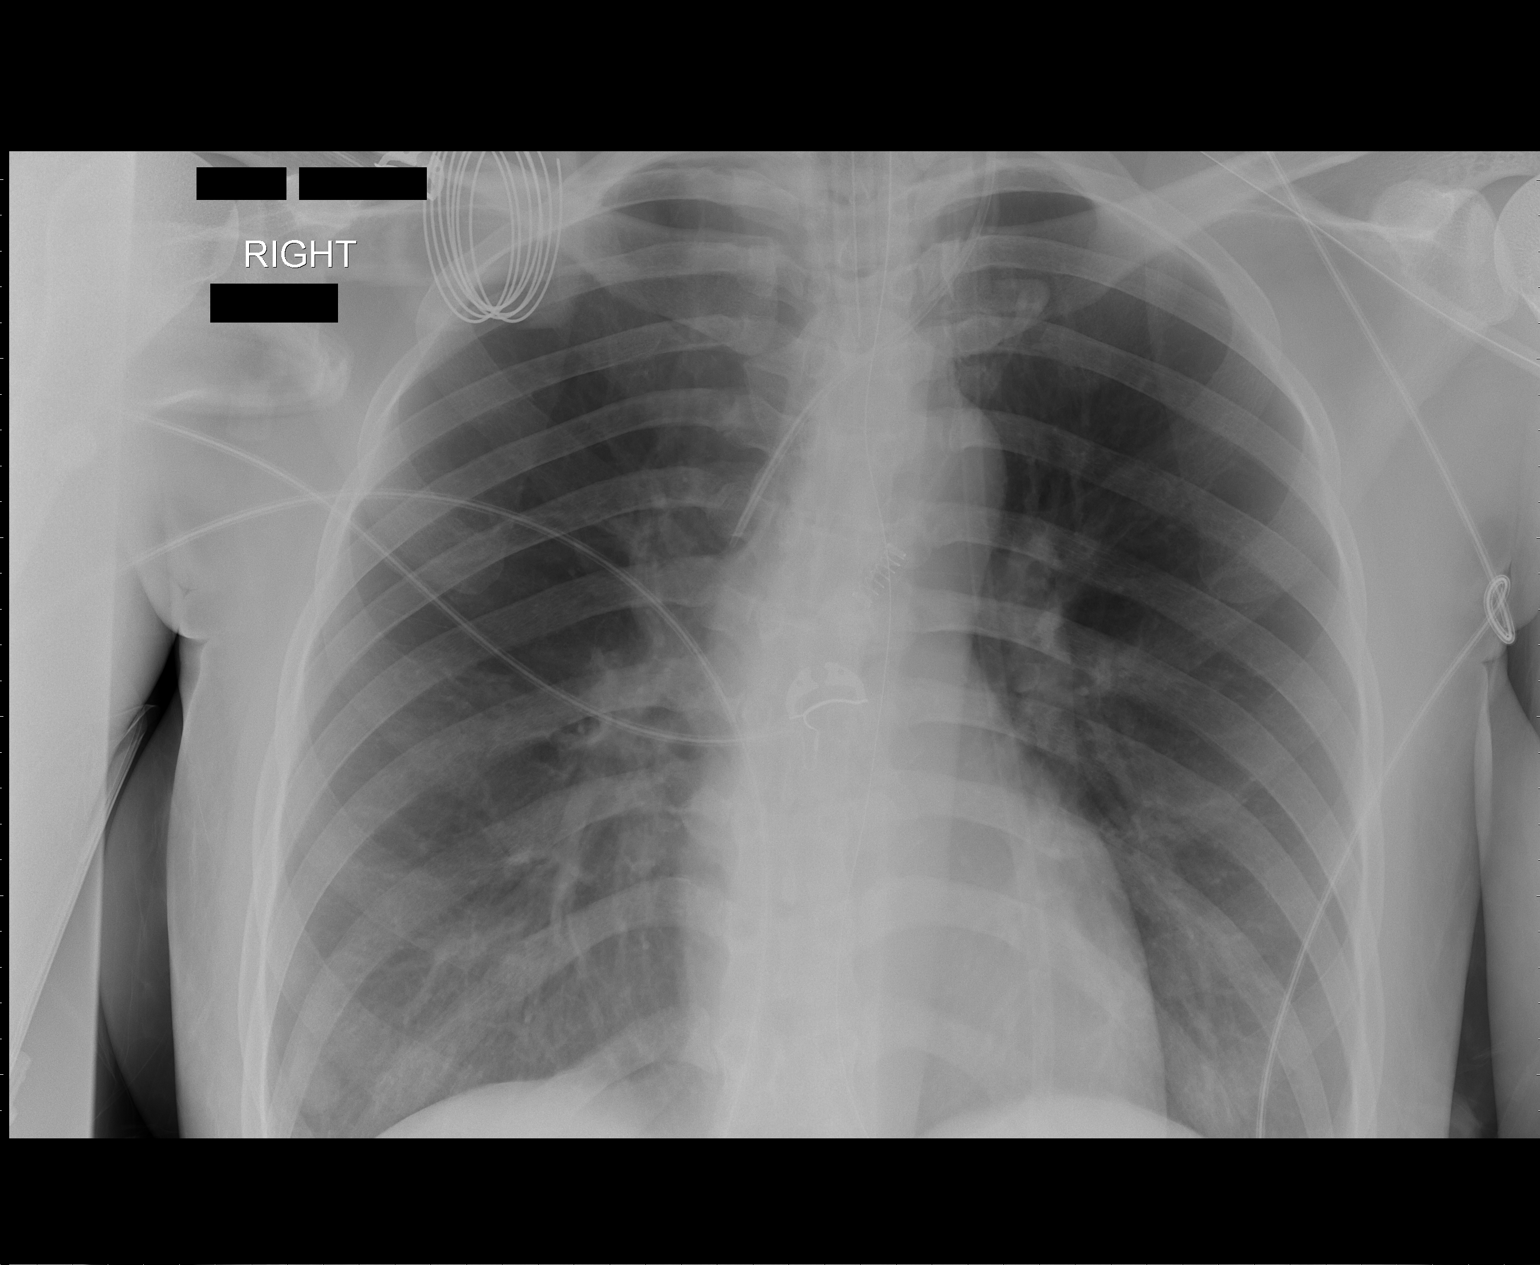

[1 of 1 positions shown; findings below may reference images not displayed]

FINDINGS: Portable view of the chest demonstrates an endotracheal
tube that is approximately 6.6 cm above the carina.  Nasogastric
tube terminates in the gastric fundus region.  There is a left
jugular central venous catheter with the tip in the proximal SVC.
Nodular density in the right lung base is consistent with a nipple
shadow.  The lungs are essentially clear.  Heart and mediastinum
are stable.
IMPRESSION: Stable support apparatuses and no focal airspace disease.

## 2011-06-07 ENCOUNTER — Other Ambulatory Visit (INDEPENDENT_AMBULATORY_CARE_PROVIDER_SITE_OTHER): Payer: Medicaid Other

## 2011-06-07 ENCOUNTER — Ambulatory Visit: Payer: Medicaid Other

## 2011-06-07 DIAGNOSIS — Z3042 Encounter for surveillance of injectable contraceptive: Secondary | ICD-10-CM

## 2011-06-07 DIAGNOSIS — Z3049 Encounter for surveillance of other contraceptives: Secondary | ICD-10-CM

## 2011-06-07 NOTE — Progress Notes (Signed)
Pt came in for depo injection but has not had one since 09/29/10. Per protocol pt needs a UP test, no sex for 2 weeks, repeat UP.   If both UP test negative pt may start depo.  Urine obtained today and scheduled pt return in 2 weeks.

## 2011-06-21 ENCOUNTER — Ambulatory Visit (INDEPENDENT_AMBULATORY_CARE_PROVIDER_SITE_OTHER): Payer: Medicaid Other | Admitting: *Deleted

## 2011-06-21 DIAGNOSIS — Z3042 Encounter for surveillance of injectable contraceptive: Secondary | ICD-10-CM

## 2011-06-21 DIAGNOSIS — Z3049 Encounter for surveillance of other contraceptives: Secondary | ICD-10-CM

## 2011-06-21 MED ORDER — MEDROXYPROGESTERONE ACETATE 150 MG/ML IM SUSP
150.0000 mg | Freq: Once | INTRAMUSCULAR | Status: AC
  Administered 2011-06-21: 150 mg via INTRAMUSCULAR

## 2011-08-12 ENCOUNTER — Other Ambulatory Visit: Payer: Self-pay | Admitting: Internal Medicine

## 2011-08-13 NOTE — Telephone Encounter (Signed)
Please call pt and let her now I will not be prescribing any more refills until she comes in for an appt. She hasnt been seen in > 1 year.  Thank you! - Johnette Abraham, D.O., 08/13/2011, 9:18 AM

## 2011-09-12 ENCOUNTER — Ambulatory Visit (INDEPENDENT_AMBULATORY_CARE_PROVIDER_SITE_OTHER): Payer: Medicaid Other | Admitting: *Deleted

## 2011-09-12 DIAGNOSIS — Z3042 Encounter for surveillance of injectable contraceptive: Secondary | ICD-10-CM

## 2011-09-12 DIAGNOSIS — Z3049 Encounter for surveillance of other contraceptives: Secondary | ICD-10-CM

## 2011-09-12 MED ORDER — MEDROXYPROGESTERONE ACETATE 150 MG/ML IM SUSP
150.0000 mg | Freq: Once | INTRAMUSCULAR | Status: AC
  Administered 2011-09-12: 150 mg via INTRAMUSCULAR

## 2011-09-12 NOTE — Progress Notes (Signed)
Patient ID: Rebecca Holmes, female   DOB: March 10, 1975, 36 y.o.   MRN: [id number]  Next injection is Nov 26 - pt made awared.

## 2011-10-20 ENCOUNTER — Other Ambulatory Visit: Payer: Self-pay | Admitting: Internal Medicine

## 2011-12-16 ENCOUNTER — Other Ambulatory Visit: Payer: Self-pay | Admitting: Internal Medicine

## 2011-12-18 ENCOUNTER — Ambulatory Visit (INDEPENDENT_AMBULATORY_CARE_PROVIDER_SITE_OTHER): Payer: Medicaid Other | Admitting: Internal Medicine

## 2011-12-18 ENCOUNTER — Encounter: Payer: Self-pay | Admitting: Internal Medicine

## 2011-12-18 VITALS — BP 127/92 | HR 96 | Temp 97.0°F | Wt 150.4 lb

## 2011-12-18 DIAGNOSIS — M545 Low back pain: Secondary | ICD-10-CM | POA: Insufficient documentation

## 2011-12-18 DIAGNOSIS — N390 Urinary tract infection, site not specified: Secondary | ICD-10-CM

## 2011-12-18 DIAGNOSIS — R42 Dizziness and giddiness: Secondary | ICD-10-CM

## 2011-12-18 DIAGNOSIS — Z720 Tobacco use: Secondary | ICD-10-CM

## 2011-12-18 DIAGNOSIS — R350 Frequency of micturition: Secondary | ICD-10-CM

## 2011-12-18 DIAGNOSIS — F17200 Nicotine dependence, unspecified, uncomplicated: Secondary | ICD-10-CM

## 2011-12-18 DIAGNOSIS — R300 Dysuria: Secondary | ICD-10-CM

## 2011-12-18 DIAGNOSIS — J45909 Unspecified asthma, uncomplicated: Secondary | ICD-10-CM

## 2011-12-18 DIAGNOSIS — Z3042 Encounter for surveillance of injectable contraceptive: Secondary | ICD-10-CM

## 2011-12-18 DIAGNOSIS — I509 Heart failure, unspecified: Secondary | ICD-10-CM

## 2011-12-18 DIAGNOSIS — F17210 Nicotine dependence, cigarettes, uncomplicated: Secondary | ICD-10-CM | POA: Insufficient documentation

## 2011-12-18 DIAGNOSIS — Z3049 Encounter for surveillance of other contraceptives: Secondary | ICD-10-CM

## 2011-12-18 DIAGNOSIS — Z79899 Other long term (current) drug therapy: Secondary | ICD-10-CM

## 2011-12-18 DIAGNOSIS — Z23 Encounter for immunization: Secondary | ICD-10-CM

## 2011-12-18 DIAGNOSIS — D509 Iron deficiency anemia, unspecified: Secondary | ICD-10-CM

## 2011-12-18 DIAGNOSIS — M549 Dorsalgia, unspecified: Secondary | ICD-10-CM

## 2011-12-18 DIAGNOSIS — G8929 Other chronic pain: Secondary | ICD-10-CM

## 2011-12-18 DIAGNOSIS — I10 Essential (primary) hypertension: Secondary | ICD-10-CM

## 2011-12-18 LAB — CBC WITH DIFFERENTIAL/PLATELET
Basophils Absolute: 0 10*3/uL (ref 0.0–0.1)
Eosinophils Relative: 1 % (ref 0–5)
Lymphocytes Relative: 26 % (ref 12–46)
MCV: 92.5 fL (ref 78.0–100.0)
Neutrophils Relative %: 69 % (ref 43–77)
Platelets: 139 10*3/uL — ABNORMAL LOW (ref 150–400)
RDW: 13.9 % (ref 11.5–15.5)
WBC: 7.5 10*3/uL (ref 4.0–10.5)

## 2011-12-18 LAB — COMPREHENSIVE METABOLIC PANEL
ALT: 9 U/L (ref 0–35)
Albumin: 4.3 g/dL (ref 3.5–5.2)
CO2: 24 mEq/L (ref 19–32)
Calcium: 9.3 mg/dL (ref 8.4–10.5)
Chloride: 105 mEq/L (ref 96–112)
Glucose, Bld: 75 mg/dL (ref 70–99)
Potassium: 4.1 mEq/L (ref 3.5–5.3)
Sodium: 138 mEq/L (ref 135–145)
Total Bilirubin: 0.3 mg/dL (ref 0.3–1.2)
Total Protein: 6.7 g/dL (ref 6.0–8.3)

## 2011-12-18 LAB — POCT URINE PREGNANCY: Preg Test, Ur: NEGATIVE

## 2011-12-18 LAB — ANEMIA PANEL
%SAT: 14 % — ABNORMAL LOW (ref 20–55)
ABS Retic: 22.8 10*3/uL (ref 19.0–186.0)
Ferritin: 21 ng/mL (ref 10–291)
Retic Ct Pct: 0.5 % (ref 0.4–2.3)
Vitamin B-12: 280 pg/mL (ref 211–911)

## 2011-12-18 MED ORDER — ALBUTEROL SULFATE HFA 108 (90 BASE) MCG/ACT IN AERS: 2.0000 | INHALATION_SPRAY | Freq: Four times a day (QID) | RESPIRATORY_TRACT | Status: DC | PRN

## 2011-12-18 MED ORDER — CYCLOBENZAPRINE HCL 10 MG PO TABS: 10.0000 mg | ORAL_TABLET | Freq: Two times a day (BID) | ORAL | Status: DC | PRN

## 2011-12-18 MED ORDER — MEDROXYPROGESTERONE ACETATE 150 MG/ML IM SUSP
150.0000 mg | Freq: Once | INTRAMUSCULAR | Status: AC
  Administered 2011-12-18: 150 mg via INTRAMUSCULAR

## 2011-12-18 MED ORDER — FLUTICASONE-SALMETEROL 250-50 MCG/DOSE IN AEPB: 1.0000 | INHALATION_SPRAY | Freq: Two times a day (BID) | RESPIRATORY_TRACT | Status: DC

## 2011-12-18 MED ORDER — FERROUS SULFATE 325 (65 FE) MG PO TABS: 325.0000 mg | ORAL_TABLET | Freq: Three times a day (TID) | ORAL | Status: DC

## 2011-12-18 NOTE — Assessment & Plan Note (Signed)
Pertinent Data: BP Readings from Last 3 Encounters:  12/18/11 127/92  06/29/10 112/87  10/07/09 119/84    Basic Metabolic Panel:    Component Value Date/Time   NA 139 08/04/2009 1932   K 4.0 08/04/2009 1932   CL 105 08/04/2009 1932   CO2 22 08/04/2009 1932   BUN 10 08/04/2009 1932   CREATININE 0.84 08/04/2009 1932   GLUCOSE 76 08/04/2009 1932   CALCIUM 9.6 08/04/2009 1932    Assessment: Disease Control: controlled  Progress toward goals: at goal  Barriers to meeting goals: Has been noncompliant with her Diovan. Of note, in the past she has been held of her lisinopril with the concern for pseudo-asthma in the setting of an ACE inhibitor.    Not currently on her Diovan. However, may benefit from resuming an ARB in the setting of her cardiomyopathy.   Patient is noncompliant much of the time with prescribed medications.   Plan:  Stop Diovan for now.  Recheck blood pressures at next visit.  Recheck 2-D echocardiogram, will consider low-dose ARB if persistent cardiomyopathy.  Continue to avoid ACE inhibitor for now in the setting of concern for possible pseudo-asthma  Educational resources provided: brochure (Simultaneous filing. User may not have seen previous data.)  Self management tools provided: other (see comments)

## 2011-12-18 NOTE — Assessment & Plan Note (Signed)
Pertinent Data: Lab Results  Component Value Date   IRON 18* 06/18/2008   TIBC 433 06/18/2008   FERRITIN 8* 06/18/2008   Lab Results  Component Value Date   WBC 8.4 08/04/2009   HGB 12.9 08/04/2009   HCT 37.6 08/04/2009   MCV 85.5 08/04/2009   PLT 169 08/04/2009    Assessment: Cause of this patient's iron deficiency anemia has thought to be previously due to her menorrhagia. However at present, the patient is no longer having menstrual cycle while on Depo-Provera. She does not confirm any blood in her stools. However, she does indicate that she is having lightheadedness and fatigue. No, she is noncompliant with her iron supplementation.  Plan:      Restart iron supplementation.   Check anemia panel and CBC today.

## 2011-12-18 NOTE — Patient Instructions (Signed)
General Instructions:  Please follow-up at the clinic in 2 weeks, at which time we will reevaluate your blood pressure, your breathing - OR, please follow-up in the clinic sooner if needed.  There have not been changes in your medications.    Please get your echocardiogram performed as soon as you can.  You are getting labs today, if they are abnormal I will give you a call.   If you have been started on new medication(s), and you develop symptoms concerning for allergic reaction, including, but not limited to, throat closing, tongue swelling, rash, please stop the medication immediately and call the clinic at [phone], and go to the ER.  If symptoms worsen, or new symptoms arise, please call the clinic or go to the ER.   PLEASE BRING ALL OF YOUR MEDICATIONS  IN A BAG TO YOUR NEXT APPOINTMENT   Treatment Goals:  Goals (1 Years of Data) as of 12/18/2011          As of Today As of Today As of Today As of Today 06/29/10     Blood Pressure    . Blood Pressure < 140/90  127/92 127/94 119/87 119/89 112/87     Lifestyle    . Quit smoking / using tobacco            Progress Toward Treatment Goals:  Treatment Goal 12/18/2011  Blood pressure at goal  Stop smoking smoking the same amount    Self Care Goals & Plans:  Self Care Goal 12/18/2011  Manage my medications take my medicines as prescribed; bring my medications to every visit  Eat healthy foods eat foods that are low in salt  Be physically active take the stairs instead of the elevator       Care Management & Community Referrals:

## 2011-12-18 NOTE — Assessment & Plan Note (Signed)
Pertinent Data:  Ref. Range 12/18/2011 08:50  Preg Test, Ur No range found Negative    Assessment: On monthly Depo. No breakthrough bleeding. Urine pregnancy test negative. No acute GU concerns.  Plan:      Depo-provera shot.  Check urinalysis.

## 2011-12-18 NOTE — Progress Notes (Signed)
Patient: Rebecca Holmes   MRN: [id number]  DOB: [birth year]    Subjective:    HPI: Rebecca Holmes is a 36 y.o. 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 Currently taking was supposed to be on Diovan, but only took intermittently, and has not had refilled in last 6 months. admits to occasional lightheadedness, shortness of breath. Denies vision changes, chest pain. does not request refills today.   2) Family Planning - The current method of family planning is Depo-Provera injections. Patient states that she is currently sexually active with 1 monogomous partner and in addition to Depo shot does not use additional forms of contraception. She denies vaginal discharge, vaginal lesions, dysuria, hematuria, malodorous urine.   3) Asthma - Patient has been out of advair for 3-4 days. States she is using her albuterol 3 times a day, for last 3 weeks. Confirms wheezing as well. Associated symptoms:congestion and cough described as clear mucous x 1 month. Patient admits to smoke cigarettes. Of note, has required intubation for prior exacerbations.  4) Tobacco abuse - patient continues to smoke 1/2 pack cigarettes per day and has done so for the past 20 years. There  have been prior attempts to quit - with successes (quit cold [place]). Barriers to quit (if any) include: family stressors.  5) Anemia - patient has history of iron-deficiency anemia, for which she is noncompliant with her iron pills. Does not have menstrual cycle while on the Depo shot. No blood in stools. No other forms of bleeding noted, no family history of bleeding disorders  to her recollection.  6) Back pain - Patient describes a several years history of stable, aching back pain located over low back pain without radiation to legs or buttocks. States that she feels significant spasm that limit her movements. She's previously been on Flexeril, which he is found to be very effective for this treatment. At its worst, the pain is rated 10/10 in severity. Aggravating factors include: physical activity, twisting/turning and lifting. Alleviating factors include: change in body position and muscle relaxants. Patient confirms back spasm. Denies symptoms of numbness, tingling, leg pain, leg weakness, abdominal pain, incontinence, perianal numbness, history of cancer, history of osteoporosis, history of steroid use.  7) Chronic mixed heart failure  - last echo 2011 showing 40% to 45%. Diffuse hypokinesis. Features are consistent with a pseudo-normal left ventricular filling pattern, with concomitant abnormal relaxation and increased filling pressure (grade 2 diastolic dysfunction).she has not followed up with a cardiologist. She denies recent chest pain. No recent orthopnea or PND. No recent increased weight gain.    Review of Systems: Per HPI.   Current Outpatient Medications: Medication Sig  . ADVAIR DISKUS 250-50 MCG/DOSE AEPB INHALE 2 PUFFS INTO THE LUNGS EVERY 12 HOURS  . cyclobenzaprine (FLEXERIL) 10 MG tablet Take 1 tablet (10 mg total) by mouth 2 (two) times daily as needed. For muscle cramps  . DULoxetine (CYMBALTA) 60 MG capsule Take 1 capsule (60 mg total) by mouth daily.  . ferrous sulfate 325 (65 FE) MG tablet Take 1 tablet (325 mg total) by mouth 3 (three) times daily with meals.  . medroxyPROGESTERone (DEPO-PROVERA) 150  MG/ML injection Inject 150 mg into the muscle every 3 (three) months.    Marland Kitchen PROVENTIL HFA 108 (90 BASE) MCG/ACT inhaler INHALE 2 PUFFS INTO LUNGS EVERY 4 TO 6 HOURS AS NEEDED FOR WHEEZE AND FOR SHORTNESS OF BREATH  . QUEtiapine (SEROQUEL) 50 MG  tablet Take 1 tablet (50 mg total) by mouth at bedtime.    Allergies  Allergen Reactions  . Peanut-Containing Drug Products     REACTION: swell up    Past Medical History  Diagnosis Date  . Heart murmur   . Left ventricular dysfunction     Decreased; Low E 20-25% in the setting of severe illness requiring ICU care 06/2008; Cardiolyte 06/13/2009 normal perfusion w/o significant ischemia or scar; LVEF 38%  . Respiratory failure     Ventilator Dependent; 2/2 status asthmatics 6/11-18/2010 admit [facility]  . HTN (hypertension)     Try off ACE 06/13/2009 due to pseudoasthma  . Vision disorder   . Anemia, iron deficiency   . Asthma     vs Pseudoasthma from ACE; Trial off ACE > PFT's normal 09/09/09 x DLCO 59 > corrects to 73%  . Depression     Past Surgical History  Procedure Date  . Dilation and curettage of uterus      Objective:    Physical Exam: Filed Vitals:   12/18/11 0829  BP: 119/89  Pulse: 81  Temp: 97 F (36.1 C)      General: Vital signs reviewed and noted. Well-developed, well-nourished, in no acute distress; alert, appropriate and cooperative throughout examination.  Head: Normocephalic, atraumatic.  Eyes: conjunctivae/corneas clear. PERRL, EOM's intact.  Ears: TM nonerythematous, not bulging, good light reflex bilaterally.  Nose: Mucous membranes moist, not inflammed, nonerythematous.  Throat: Oropharynx nonerythematous, no exudate appreciated.   Neck: No deformities, masses, or tenderness noted.  Lungs:  Normal respiratory effort. Clear to auscultation BL without crackles or wheezes.  Heart: RRR. S1 and S2 normal without gallop, rubs. (+) murmur.  Abdomen:  BS normoactive. Soft, Nondistended, non-tender.  No masses or organomegaly.  Extremities: No pretibial edema. Back - bilateral paraspinal musculature with hypertonicity over lower thoracic and lumbar spine. No midline tenderness. Negative straight leg raise.  Neurologic: A&O X3, CN II - XII are grossly intact.  Motor strength is 5/5 in the all 4 extremities, Sensations intact to light touch, Cerebellar signs negative.      Assessment/ Plan:   Case and plan of care discussed with attending physician, Dr. Margarito Liner.   Greater than 40 minutes spent with the patient with > 50% of time spent in direct face-to-face counseling.

## 2011-12-18 NOTE — Assessment & Plan Note (Signed)
Assessment: Not controlled at present, is using her albuterol 3 times daily, however, has been out of her Advair for quite a while (admits only to being out for 3 days, however, last refilled regularly more than a year an a half ago).  Plan:      Refill of Advair given - recommended to use daily without missing doses.  Refill for albuterol given.  Patient was counseled about the need for regular followup, particularly given that she's review see required intubation for status asthmaticus.  Is recommended to stop smoking.

## 2011-12-18 NOTE — Assessment & Plan Note (Addendum)
Pertinent Data:  2-D echocardiogram (06/2008) - Systolic function was severely reduced. LVEF 20% to 25%. Diffusehypokinesis. Doppler parameters are consistent with a reversible restrictive pattern, indicative of decreased left ventricular diastolic compliance and/or increased left atrial pressure (grade 3 diastolic dysfunction)  2-D echocardiogram (05/2009) - Wall thickness was normal. Systolic function was mildly to moderately reduced. LV EF 40% to 45%. Diffuse hypokinesis. Features are consistent with a pseudonormal left ventricular filling pattern, with concomitant abnormal relaxation and increased filling pressure (grade 2 diastolic dysfunction).   Assessment: Patient has significant mixed congestive heart failure. Previously had an an EF as low as 20% in the setting of status asthmaticus. She had some recovery of cardiac function by the time of repeat echocardiogram and 05/2009. However, the patient has been noncompliant with her ARB therapy at least for the past 6 months to a year. The patient has no acute exam findings concerning for acute exacerbation, however she does confirm subjective shortness of breath.  Plan:      Will recheck 2-D echocardiogram - need to be preauthorized.  As previously mentioned, she may also need resumption of an ARB dependent on results.

## 2011-12-18 NOTE — Assessment & Plan Note (Addendum)
Pertinent Data: L Spine XR (09/2009) - Slight disc space narrowing is noted at L4-5 and L5-S1. Early disc space narrowing in the lower lumbar spine.  Assessment: Likely muscular skeletal pain, which is consistent with physical exam and symptoms presented. No red flag symptoms identified.  Plan:      Will refill her Flexeril for short course, given the innumerable number of concerns she had today, explained that we will need to further discuss this at her next visit   Can use when necessary over-the-counter NSAIDs for pain relief in the meantime.  Recommended back stretching exercises.  Counseled on red flag symptoms to prompt immediate reevaluation.

## 2011-12-18 NOTE — Assessment & Plan Note (Signed)
  Assessment: 1. The patient was counseled on the dangers of tobacco use, which include, but are not limited to cardiovascular disease, increased cancer risk of multiple types of cancer, COPD, peripheral vascular disease, strokes. 2. She was also counseled on the benefits of smoking cessation. 3. Progress toward smoking cessation:  stress 4. Barriers to progress toward smoking cessation:  smoking the same amount  Plan: 1. Instruction/counseling given: The patient was firmly advised to quit and referred to a tobacco cessation program.   2. We also reviewed strategies to maximize success, including:  Removing cigarettes and smoking materials from environment  Stress management  Substitution of other forms of reinforcement Support of family/friends.  Selecting a quit date. 3. Educational resources provided: [facility] (1-800-QUIT-NOW) brochure 4. Self management tools provided: smoking cessation plan (STAR Quit Plan) 5. Medications to assist with smoking cessation: None for now, will readdress in future. 6. Patient agreed to the following self-care plans for smoking cessation:  Will work on decreasing amount of cigarette use by next visit.

## 2011-12-19 DIAGNOSIS — N390 Urinary tract infection, site not specified: Secondary | ICD-10-CM | POA: Insufficient documentation

## 2011-12-19 LAB — URINALYSIS, ROUTINE W REFLEX MICROSCOPIC
Ketones, ur: NEGATIVE mg/dL
Nitrite: POSITIVE — AB
Specific Gravity, Urine: 1.029 (ref 1.005–1.030)
Urobilinogen, UA: 0.2 mg/dL (ref 0.0–1.0)

## 2011-12-19 LAB — URINALYSIS, MICROSCOPIC ONLY

## 2011-12-19 MED ORDER — SULFAMETHOXAZOLE-TRIMETHOPRIM 800-160 MG PO TABS: 1.0000 | ORAL_TABLET | Freq: Two times a day (BID) | ORAL | Status: AC

## 2011-12-19 NOTE — Addendum Note (Signed)
Addended by: Priscella Mann on: 12/19/2011 09:06 AM   Modules accepted: Orders

## 2011-12-19 NOTE — Progress Notes (Signed)
Quick Note:  Will call pt again to assess if she is in fact having dysuria, as her UA indicates many bacteria and leukocytes, although WBC 3-6. Will treat accordingly.  ______

## 2011-12-19 NOTE — Progress Notes (Signed)
Quick Note:  Unable to get a hold of the patient. However, given UA findings and complaints of increased urinary frequency, will treat for UTI. Will be advised to use second method of birth control in setting of antibiotic use. ______

## 2011-12-19 NOTE — Progress Notes (Signed)
Quick Note:  Hgb is back within normal range. However, ferritin is low and iron is low normal. Will ask her to start at least once daily iron supplementation with plan to recheck this in 3-6 months to assess if she ends up having overt anemia. ______

## 2011-12-19 NOTE — Assessment & Plan Note (Signed)
ADDENDUM TO PLAN AFTER LABS RESULTED: Pertinent Labs:  Basename 12/18/11 0845  COLORURINE YELLOW  LABSPEC 1.029  PHURINE 5.5  GLUCOSEU NEG  HGBUR SMALL*  BILIRUBINUR NEG  KETONESUR NEG  PROTEINUR NEG  UROBILINOGEN 0.2  NITRITE POS*  LEUKOCYTESUR NEG    Assessment: Given UA findings and complaints of increased urinary frequency, will tx for uncomplicated UTI. I have attempted to get a hold of the patient without success. Will ask triage to continue efforts.  Plan:  Bactrim DS x 3 days.  Pt will be advised that while being treated with antibiotics, birth control is less effective. Therefore, she will need to use a second form of contraception during this time.  She should avoid taking with alcohol.

## 2011-12-25 ENCOUNTER — Emergency Department (HOSPITAL_COMMUNITY)
Admission: EM | Admit: 2011-12-25 | Discharge: 2011-12-25 | Disposition: A | Discharge status: Home / Self Care | Payer: No Typology Code available for payment source | Attending: Emergency Medicine | Admitting: Emergency Medicine

## 2011-12-25 ENCOUNTER — Encounter (HOSPITAL_COMMUNITY): Payer: Self-pay | Admitting: *Deleted

## 2011-12-25 ENCOUNTER — Emergency Department (HOSPITAL_COMMUNITY): Payer: No Typology Code available for payment source

## 2011-12-25 DIAGNOSIS — J45909 Unspecified asthma, uncomplicated: Secondary | ICD-10-CM | POA: Insufficient documentation

## 2011-12-25 DIAGNOSIS — I10 Essential (primary) hypertension: Secondary | ICD-10-CM | POA: Insufficient documentation

## 2011-12-25 DIAGNOSIS — Y9389 Activity, other specified: Secondary | ICD-10-CM | POA: Insufficient documentation

## 2011-12-25 DIAGNOSIS — S161XXA Strain of muscle, fascia and tendon at neck level, initial encounter: Secondary | ICD-10-CM

## 2011-12-25 DIAGNOSIS — IMO0002 Reserved for concepts with insufficient information to code with codable children: Secondary | ICD-10-CM | POA: Insufficient documentation

## 2011-12-25 DIAGNOSIS — D509 Iron deficiency anemia, unspecified: Secondary | ICD-10-CM | POA: Insufficient documentation

## 2011-12-25 DIAGNOSIS — Y92410 Unspecified street and highway as the place of occurrence of the external cause: Secondary | ICD-10-CM | POA: Insufficient documentation

## 2011-12-25 DIAGNOSIS — R011 Cardiac murmur, unspecified: Secondary | ICD-10-CM | POA: Insufficient documentation

## 2011-12-25 DIAGNOSIS — Z79899 Other long term (current) drug therapy: Secondary | ICD-10-CM | POA: Insufficient documentation

## 2011-12-25 DIAGNOSIS — F3289 Other specified depressive episodes: Secondary | ICD-10-CM | POA: Insufficient documentation

## 2011-12-25 DIAGNOSIS — Z9889 Other specified postprocedural states: Secondary | ICD-10-CM | POA: Insufficient documentation

## 2011-12-25 DIAGNOSIS — F329 Major depressive disorder, single episode, unspecified: Secondary | ICD-10-CM | POA: Insufficient documentation

## 2011-12-25 DIAGNOSIS — S139XXA Sprain of joints and ligaments of unspecified parts of neck, initial encounter: Secondary | ICD-10-CM | POA: Insufficient documentation

## 2011-12-25 DIAGNOSIS — F17200 Nicotine dependence, unspecified, uncomplicated: Secondary | ICD-10-CM | POA: Insufficient documentation

## 2011-12-25 DIAGNOSIS — R51 Headache: Secondary | ICD-10-CM | POA: Insufficient documentation

## 2011-12-25 DIAGNOSIS — Z8709 Personal history of other diseases of the respiratory system: Secondary | ICD-10-CM | POA: Insufficient documentation

## 2011-12-25 MED ORDER — HYDROCODONE-ACETAMINOPHEN 5-325 MG PO TABS: 1.0000 | ORAL_TABLET | ORAL | Status: DC | PRN

## 2011-12-25 NOTE — ED Notes (Signed)
Pt returned from radiology.
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
## 2012-04-24 ENCOUNTER — Encounter: Payer: Self-pay | Admitting: Internal Medicine

## 2012-11-12 ENCOUNTER — Encounter: Payer: Self-pay | Admitting: Internal Medicine

## 2012-11-20 ENCOUNTER — Telehealth: Payer: Self-pay | Admitting: Internal Medicine

## 2012-11-20 NOTE — Telephone Encounter (Signed)
Patient was called on 11/12/12 and 11/21/12 to schedule HM visit, but phone is off and voicemail is not setup so I was unable to leave message.  A letter was mailed to the patient on 11-12-12, but was returned today due to patient no longer at that address.

## 2012-11-27 ENCOUNTER — Emergency Department (HOSPITAL_COMMUNITY)
Admission: EM | Admit: 2012-11-27 | Discharge: 2012-11-27 | Disposition: A | Discharge status: Home / Self Care | Payer: Self-pay | Attending: Emergency Medicine | Admitting: Emergency Medicine

## 2012-11-27 ENCOUNTER — Encounter (HOSPITAL_COMMUNITY): Payer: Self-pay | Admitting: Emergency Medicine

## 2012-11-27 ENCOUNTER — Emergency Department (HOSPITAL_COMMUNITY): Payer: Self-pay

## 2012-11-27 DIAGNOSIS — R011 Cardiac murmur, unspecified: Secondary | ICD-10-CM | POA: Insufficient documentation

## 2012-11-27 DIAGNOSIS — J9600 Acute respiratory failure, unspecified whether with hypoxia or hypercapnia: Secondary | ICD-10-CM | POA: Insufficient documentation

## 2012-11-27 DIAGNOSIS — D509 Iron deficiency anemia, unspecified: Secondary | ICD-10-CM | POA: Insufficient documentation

## 2012-11-27 DIAGNOSIS — R05 Cough: Secondary | ICD-10-CM | POA: Insufficient documentation

## 2012-11-27 DIAGNOSIS — Z9911 Dependence on respirator [ventilator] status: Secondary | ICD-10-CM | POA: Insufficient documentation

## 2012-11-27 DIAGNOSIS — J329 Chronic sinusitis, unspecified: Secondary | ICD-10-CM

## 2012-11-27 DIAGNOSIS — F329 Major depressive disorder, single episode, unspecified: Secondary | ICD-10-CM | POA: Insufficient documentation

## 2012-11-27 DIAGNOSIS — I10 Essential (primary) hypertension: Secondary | ICD-10-CM | POA: Insufficient documentation

## 2012-11-27 DIAGNOSIS — F17200 Nicotine dependence, unspecified, uncomplicated: Secondary | ICD-10-CM | POA: Insufficient documentation

## 2012-11-27 DIAGNOSIS — J069 Acute upper respiratory infection, unspecified: Secondary | ICD-10-CM

## 2012-11-27 DIAGNOSIS — Z79899 Other long term (current) drug therapy: Secondary | ICD-10-CM | POA: Insufficient documentation

## 2012-11-27 DIAGNOSIS — R0789 Other chest pain: Secondary | ICD-10-CM | POA: Insufficient documentation

## 2012-11-27 DIAGNOSIS — F3289 Other specified depressive episodes: Secondary | ICD-10-CM | POA: Insufficient documentation

## 2012-11-27 DIAGNOSIS — IMO0002 Reserved for concepts with insufficient information to code with codable children: Secondary | ICD-10-CM | POA: Insufficient documentation

## 2012-11-27 DIAGNOSIS — Z8669 Personal history of other diseases of the nervous system and sense organs: Secondary | ICD-10-CM | POA: Insufficient documentation

## 2012-11-27 DIAGNOSIS — R059 Cough, unspecified: Secondary | ICD-10-CM

## 2012-11-27 MED ORDER — ALBUTEROL SULFATE HFA 108 (90 BASE) MCG/ACT IN AERS
2.0000 | INHALATION_SPRAY | Freq: Once | RESPIRATORY_TRACT | Status: AC
  Administered 2012-11-27: 2 via RESPIRATORY_TRACT
  Filled 2012-11-27: qty 6.7

## 2012-11-27 MED ORDER — GUAIFENESIN ER 600 MG PO TB12: 600.0000 mg | ORAL_TABLET | Freq: Two times a day (BID) | ORAL | Status: DC

## 2012-11-27 MED ORDER — FLUTICASONE PROPIONATE 50 MCG/ACT NA SUSP: 2.0000 | Freq: Every day | NASAL | Status: DC

## 2012-11-27 MED ORDER — ALBUTEROL SULFATE (5 MG/ML) 0.5% IN NEBU
5.0000 mg | INHALATION_SOLUTION | Freq: Once | RESPIRATORY_TRACT | Status: AC
  Administered 2012-11-27: 5 mg via RESPIRATORY_TRACT
  Filled 2012-11-27: qty 1

## 2012-11-27 NOTE — ED Notes (Signed)
Respiratory called

## 2012-11-27 NOTE — ED Notes (Signed)
Bed: ZO10 Expected date:  Expected time:  Means of arrival:  Comments: EMS 37yo Rebecca Holmes [facility]

## 2012-11-27 NOTE — ED Notes (Addendum)
Per EMS pt has been intermittent SOB with productive cough for a few weeks. Pt complaint of worsening condition since 0300 this am. Pt reports out of albuterol treatment with hx of asthma. EMS reports given neb treatment on way.

## 2012-11-27 NOTE — ED Notes (Signed)
Pt transported to xray 

## 2012-11-27 NOTE — ED Provider Notes (Signed)
CSN: [id number]     Arrival date & time 11/27/12  4098 History   First MD Initiated Contact with Patient 11/27/12 [phone]     Chief Complaint  Patient presents with  . Asthma   (Consider location/radiation/quality/duration/timing/severity/associated sxs/prior Treatment) HPI Comments: Pt is a 37 y/o female with a PMHx of asthma, hypertension, depression and left ventricular dysfunction who presents to the emergency department via EMS complaining of an asthma exacerbation beginning around 3 clock this morning. Patient states when she went to bed last night she felt a little shortness of breath with wheezing, worsened throughout the night. She tried using her nebulizer machine with water only with minimal relief. She has been out of her inhalers for the past 2-3 months. Admits to associated chest tightness and productive cough with clear phlegm intermittently over the past 2 weeks. She was given a nebulizer treatment en route via EMS without relief. Denies fever, lightheadedness, dizziness, nausea or vomiting.   The history is provided by the patient and the EMS personnel.    Past Medical History  Diagnosis Date  . Heart murmur   . Left ventricular dysfunction     Decreased; Low E 20-25% in the setting of severe illness requiring ICU care 06/2008; Cardiolyte 06/13/2009 normal perfusion w/o significant ischemia or scar; LVEF 38%  . Respiratory failure     Ventilator Dependent; 2/2 status asthmatics 6/11-18/2010 admit [facility]  . HTN (hypertension)     Try off ACE 06/13/2009 due to pseudoasthma  . Vision disorder   . Anemia, iron deficiency   . Asthma     vs Pseudoasthma from ACE; Trial off ACE > PFT's normal 09/09/09 x DLCO 59 > corrects to 73%  . Depression    Past Surgical History  Procedure Laterality Date  . Dilation and curettage of uterus     Family History  Problem Relation Age of Onset  . Diabetes Mother 39  . Cancer Mother     unknown   . Diabetes type II Brother 20   History   Substance Use Topics  . Smoking status: Current Every Day Smoker -- 0.50 packs/day for 19 years    Types: Cigarettes  . Smokeless tobacco: Never Used  . Alcohol Use: Yes     Comment: occasionally   OB History   Grav Para Term Preterm Abortions TAB SAB Ect Mult Living                 Review of Systems  Respiratory: Positive for cough, chest tightness, shortness of breath and wheezing.   All other systems reviewed and are negative.    Allergies  Peanut-containing drug products  Home Medications   Current Outpatient Rx  Name  Route  Sig  Dispense  Refill  . albuterol (PROVENTIL HFA;VENTOLIN HFA) 108 (90 BASE) MCG/ACT inhaler   Inhalation   Inhale 2 puffs into the lungs every 6 (six) hours as needed. For shortness of breath         . cyclobenzaprine (FLEXERIL) 10 MG tablet   Oral   Take 10 mg by mouth 2 (two) times daily as needed. For muscle cramps         . dextromethorphan 15 MG/5ML syrup   Oral   Take 10 mLs by mouth 4 (four) times daily as needed. For congestion         . DULoxetine (CYMBALTA) 60 MG capsule   Oral   Take 60 mg by mouth daily.         Marland Kitchen  ferrous sulfate 325 (65 FE) MG tablet   Oral   Take 325 mg by mouth 3 (three) times daily with meals.         . Fluticasone-Salmeterol (ADVAIR) 250-50 MCG/DOSE AEPB   Inhalation   Inhale 1 puff into the lungs 2 (two) times daily.         . GuaiFENesin (MUCINEX PO)   Oral   Take 1 tablet by mouth daily as needed. For congestion         . HYDROcodone-acetaminophen (NORCO/VICODIN) 5-325 MG per tablet   Oral   Take 1-2 tablets by mouth every 4 (four) hours as needed for pain.   15 tablet   0   . losartan (COZAAR) 25 MG tablet   Oral   Take 25 mg by mouth daily as needed. When seeing black spots.         . medroxyPROGESTERone (DEPO-PROVERA) 150 MG/ML injection   Intramuscular   Inject 150 mg into the muscle every 3 (three) months. Next due March 2014         . OVER THE COUNTER  MEDICATION   Oral   Take 1 tablet by mouth daily as needed. For cold symptoms         . QUEtiapine (SEROQUEL) 50 MG tablet   Oral   Take 50 mg by mouth at bedtime.         . valsartan (DIOVAN) 160 MG tablet   Oral   Take 160 mg by mouth daily as needed. For chest pain          SpO2 96% Physical Exam  Nursing note and vitals reviewed. Constitutional: She is oriented to person, place, and time. She appears well-developed and well-nourished. No distress.  HENT:  Head: Normocephalic and atraumatic.  Nose: Mucosal edema present. Right sinus exhibits maxillary sinus tenderness and frontal sinus tenderness. Left sinus exhibits maxillary sinus tenderness and frontal sinus tenderness.  Mouth/Throat: Posterior oropharyngeal erythema present. No oropharyngeal exudate or posterior oropharyngeal edema.  Post nasal drip present.  Eyes: Conjunctivae are normal.  Neck: Normal range of motion. Neck supple.  Cardiovascular: Normal rate, regular rhythm, normal heart sounds and intact distal pulses.   Pulmonary/Chest: Effort normal and breath sounds normal. No respiratory distress. She has no decreased breath sounds. She has no wheezes. She has no rhonchi. She has no rales.  Musculoskeletal: Normal range of motion. She exhibits no edema.  Neurological: She is alert and oriented to person, place, and time.  Skin: Skin is warm and dry. She is not diaphoretic.  Psychiatric: She has a normal mood and affect. Her behavior is normal.    ED Course  Procedures (including critical care time) Labs Review Labs Reviewed - No data to display Imaging Review Dg Chest 2 View  11/27/2012   CLINICAL DATA:  Chest pain  EXAM: CHEST  2 VIEW  COMPARISON:  June 30, 2009  FINDINGS: There is no edema or consolidation. Heart size and pulmonary vascularity are normal. No adenopathy. There is upper thoracic levoscoliosis.  IMPRESSION: No edema or consolidation.   Electronically Signed   By: Bretta Bang M.D.   On:  11/27/2012 08:30    EKG Interpretation   None       MDM   1. Sinusitis   2. Cough   3. URI (upper respiratory infection)    Pt with cough, chest tightness, given neb in EMS without relief. Lungs clear. She is well appearing and in NAD, normal VS, no hypoxea. PERC  negative. Doubt CAD, low risk. Nasal congestion, post nasal drip and sinus tenderness present. URI s/s. Will give neb tx, get CXR. Case discussed with attending Dr. Jodi Mourning who also evaluated patient and agrees with plan of care. 8:47 AM CXR clear. Pt reports improvement of symptoms with neb tx. Stable for discharge, home with albuterol inhaler, mucinex, flonase, nasal saline. Return precautions given. Patient states understanding of treatment care plan and is agreeable.     Trevor Mace, PA-C 11/27/12 [phone]

## 2012-11-27 NOTE — ED Provider Notes (Signed)
Medical screening examination/treatment/procedure(s) were conducted as a shared visit with non-physician practitioner(s) or resident and myself. I personally evaluated the patient during the encounter and agree with the findings and plan unless otherwise indicated.  I have personally reviewed any xrays and/ or EKG's with the provider and I agree with interpretation.  Recurrent cough, worse with congestion and similar to asthma hx. Exam lungs clear, coughing in the room, RRR, no leg swelling or pain, well appearing. Maxillary sinus tenderness. Improved after albuterol on route, pt still feels congested/ wheezy on recheck, plan for CXR to rule out pneumonia and duoneb. No concern for cardiac or PE at this time.  Sinusitis, Cough, asthma hx   Enid Skeens, MD 11/27/12 [phone]

## 2013-01-13 ENCOUNTER — Inpatient Hospital Stay (HOSPITAL_COMMUNITY)
Admission: EM | Admit: 2013-01-13 | Discharge: 2013-01-15 | DRG: 190 | Disposition: A | Discharge status: Home / Self Care | Payer: Medicaid Other | Attending: Internal Medicine | Admitting: Internal Medicine

## 2013-01-13 ENCOUNTER — Encounter (HOSPITAL_COMMUNITY): Payer: Self-pay | Admitting: Emergency Medicine

## 2013-01-13 ENCOUNTER — Emergency Department (HOSPITAL_COMMUNITY): Payer: Medicaid Other

## 2013-01-13 DIAGNOSIS — M549 Dorsalgia, unspecified: Secondary | ICD-10-CM

## 2013-01-13 DIAGNOSIS — F17210 Nicotine dependence, cigarettes, uncomplicated: Secondary | ICD-10-CM | POA: Diagnosis present

## 2013-01-13 DIAGNOSIS — Z3042 Encounter for surveillance of injectable contraceptive: Secondary | ICD-10-CM

## 2013-01-13 DIAGNOSIS — F3289 Other specified depressive episodes: Secondary | ICD-10-CM | POA: Diagnosis present

## 2013-01-13 DIAGNOSIS — J09X1 Influenza due to identified novel influenza A virus with pneumonia: Secondary | ICD-10-CM | POA: Diagnosis present

## 2013-01-13 DIAGNOSIS — Z0000 Encounter for general adult medical examination without abnormal findings: Secondary | ICD-10-CM

## 2013-01-13 DIAGNOSIS — J45909 Unspecified asthma, uncomplicated: Secondary | ICD-10-CM

## 2013-01-13 DIAGNOSIS — I509 Heart failure, unspecified: Secondary | ICD-10-CM

## 2013-01-13 DIAGNOSIS — Z825 Family history of asthma and other chronic lower respiratory diseases: Secondary | ICD-10-CM

## 2013-01-13 DIAGNOSIS — M545 Low back pain, unspecified: Secondary | ICD-10-CM

## 2013-01-13 DIAGNOSIS — I10 Essential (primary) hypertension: Secondary | ICD-10-CM | POA: Diagnosis present

## 2013-01-13 DIAGNOSIS — Z720 Tobacco use: Secondary | ICD-10-CM

## 2013-01-13 DIAGNOSIS — J45902 Unspecified asthma with status asthmaticus: Secondary | ICD-10-CM | POA: Diagnosis present

## 2013-01-13 DIAGNOSIS — G4700 Insomnia, unspecified: Secondary | ICD-10-CM | POA: Diagnosis present

## 2013-01-13 DIAGNOSIS — K219 Gastro-esophageal reflux disease without esophagitis: Secondary | ICD-10-CM | POA: Diagnosis present

## 2013-01-13 DIAGNOSIS — Z23 Encounter for immunization: Secondary | ICD-10-CM

## 2013-01-13 DIAGNOSIS — J441 Chronic obstructive pulmonary disease with (acute) exacerbation: Principal | ICD-10-CM | POA: Diagnosis present

## 2013-01-13 DIAGNOSIS — F329 Major depressive disorder, single episode, unspecified: Secondary | ICD-10-CM | POA: Diagnosis present

## 2013-01-13 DIAGNOSIS — J9620 Acute and chronic respiratory failure, unspecified whether with hypoxia or hypercapnia: Secondary | ICD-10-CM | POA: Diagnosis present

## 2013-01-13 DIAGNOSIS — I5042 Chronic combined systolic (congestive) and diastolic (congestive) heart failure: Secondary | ICD-10-CM | POA: Diagnosis present

## 2013-01-13 DIAGNOSIS — J45901 Unspecified asthma with (acute) exacerbation: Secondary | ICD-10-CM

## 2013-01-13 DIAGNOSIS — D509 Iron deficiency anemia, unspecified: Secondary | ICD-10-CM | POA: Diagnosis present

## 2013-01-13 DIAGNOSIS — N390 Urinary tract infection, site not specified: Secondary | ICD-10-CM

## 2013-01-13 DIAGNOSIS — R000 Tachycardia, unspecified: Secondary | ICD-10-CM | POA: Diagnosis present

## 2013-01-13 DIAGNOSIS — Z79899 Other long term (current) drug therapy: Secondary | ICD-10-CM

## 2013-01-13 DIAGNOSIS — M542 Cervicalgia: Secondary | ICD-10-CM

## 2013-01-13 DIAGNOSIS — Z91010 Allergy to peanuts: Secondary | ICD-10-CM

## 2013-01-13 DIAGNOSIS — F17200 Nicotine dependence, unspecified, uncomplicated: Secondary | ICD-10-CM

## 2013-01-13 DIAGNOSIS — D696 Thrombocytopenia, unspecified: Secondary | ICD-10-CM | POA: Diagnosis present

## 2013-01-13 HISTORY — DX: Insomnia, unspecified: G47.00

## 2013-01-13 LAB — CBC WITH DIFFERENTIAL/PLATELET
BASOS PCT: 0 % (ref 0–1)
Basophils Absolute: 0 10*3/uL (ref 0.0–0.1)
EOS ABS: 0 10*3/uL (ref 0.0–0.7)
EOS PCT: 0 % (ref 0–5)
HEMATOCRIT: 38 % (ref 36.0–46.0)
HEMOGLOBIN: 13.5 g/dL (ref 12.0–15.0)
LYMPHS PCT: 6 % — AB (ref 12–46)
Lymphs Abs: 0.6 10*3/uL — ABNORMAL LOW (ref 0.7–4.0)
MCH: 31.6 pg (ref 26.0–34.0)
MCHC: 35.5 g/dL (ref 30.0–36.0)
MCV: 89 fL (ref 78.0–100.0)
MONOS PCT: 2 % — AB (ref 3–12)
Monocytes Absolute: 0.2 10*3/uL (ref 0.1–1.0)
NEUTROS ABS: 8.7 10*3/uL — AB (ref 1.7–7.7)
NEUTROS PCT: 92 % — AB (ref 43–77)
Platelets: 123 10*3/uL — ABNORMAL LOW (ref 150–400)
RBC: 4.27 MIL/uL (ref 3.87–5.11)
RDW: 14.2 % (ref 11.5–15.5)
WBC Morphology: INCREASED
WBC: 9.5 10*3/uL (ref 4.0–10.5)

## 2013-01-13 LAB — BASIC METABOLIC PANEL
BUN: 7 mg/dL (ref 6–23)
CHLORIDE: 97 meq/L (ref 96–112)
CO2: 14 meq/L — AB (ref 19–32)
Calcium: 8.8 mg/dL (ref 8.4–10.5)
Creatinine, Ser: 0.84 mg/dL (ref 0.50–1.10)
GFR calc Af Amer: >90 mL/min (ref >90)
GFR, EST NON AFRICAN AMERICAN: 88 mL/min — AB (ref >90)
GLUCOSE: 222 mg/dL — AB (ref 70–99)
POTASSIUM: 2.5 meq/L — AB (ref 3.7–5.3)
SODIUM: 137 meq/L (ref 137–147)

## 2013-01-13 LAB — POCT PREGNANCY, URINE: PREG TEST UR: NEGATIVE

## 2013-01-13 LAB — MRSA PCR SCREENING: MRSA BY PCR: NEGATIVE

## 2013-01-13 MED ORDER — SODIUM CHLORIDE 0.9 % IV SOLN
INTRAVENOUS | Status: DC
Start: 2013-01-13 — End: 2013-01-15
  Administered 2013-01-15: 01:00:00 via INTRAVENOUS

## 2013-01-13 MED ORDER — OXYCODONE HCL 5 MG PO TABS
5.0000 mg | ORAL_TABLET | ORAL | Status: DC | PRN
  Administered 2013-01-13 – 2013-01-14 (×2): 5 mg via ORAL
  Filled 2013-01-13 (×2): qty 1

## 2013-01-13 MED ORDER — BISACODYL 5 MG PO TBEC
5.0000 mg | DELAYED_RELEASE_TABLET | Freq: Every day | ORAL | Status: DC | PRN
  Filled 2013-01-13: qty 1

## 2013-01-13 MED ORDER — METHYLPREDNISOLONE SODIUM SUCC 125 MG IJ SOLR
60.0000 mg | Freq: Four times a day (QID) | INTRAMUSCULAR | Status: DC
  Administered 2013-01-13 – 2013-01-14 (×3): 60 mg via INTRAVENOUS
  Filled 2013-01-13: qty 0.96
  Filled 2013-01-13: qty 2
  Filled 2013-01-13 (×5): qty 0.96

## 2013-01-13 MED ORDER — IPRATROPIUM BROMIDE 0.02 % IN SOLN
1.0000 mg | Freq: Once | RESPIRATORY_TRACT | Status: AC
  Administered 2013-01-13: 1 mg via RESPIRATORY_TRACT
  Filled 2013-01-13: qty 5

## 2013-01-13 MED ORDER — IPRATROPIUM BROMIDE 0.02 % IN SOLN
0.5000 mg | RESPIRATORY_TRACT | Status: DC
  Administered 2013-01-13 – 2013-01-15 (×9): 0.5 mg via RESPIRATORY_TRACT
  Filled 2013-01-13 (×9): qty 2.5

## 2013-01-13 MED ORDER — QUETIAPINE FUMARATE 50 MG PO TABS
50.0000 mg | ORAL_TABLET | Freq: Every day | ORAL | Status: DC
  Administered 2013-01-13 – 2013-01-14 (×2): 50 mg via ORAL
  Filled 2013-01-13 (×4): qty 1

## 2013-01-13 MED ORDER — ONDANSETRON HCL 4 MG/2ML IJ SOLN
4.0000 mg | Freq: Four times a day (QID) | INTRAMUSCULAR | Status: DC | PRN
  Administered 2013-01-13: 4 mg via INTRAVENOUS
  Filled 2013-01-13: qty 2

## 2013-01-13 MED ORDER — ALUM & MAG HYDROXIDE-SIMETH 200-200-20 MG/5ML PO SUSP: 30.0000 mL | Freq: Four times a day (QID) | ORAL | Status: DC | PRN

## 2013-01-13 MED ORDER — DULOXETINE HCL 60 MG PO CPEP
60.0000 mg | ORAL_CAPSULE | Freq: Every morning | ORAL | Status: DC
Start: 2013-01-13 — End: 2013-01-15
  Administered 2013-01-14 – 2013-01-15 (×2): 60 mg via ORAL
  Filled 2013-01-13 (×3): qty 1

## 2013-01-13 MED ORDER — POTASSIUM CHLORIDE CRYS ER 20 MEQ PO TBCR
40.0000 meq | EXTENDED_RELEASE_TABLET | ORAL | Status: AC
  Administered 2013-01-13 (×2): 40 meq via ORAL
  Filled 2013-01-13 (×3): qty 2

## 2013-01-13 MED ORDER — POLYETHYLENE GLYCOL 3350 17 G PO PACK
17.0000 g | PACK | Freq: Every day | ORAL | Status: DC | PRN
  Filled 2013-01-13: qty 1

## 2013-01-13 MED ORDER — ONDANSETRON HCL 4 MG PO TABS: 4.0000 mg | ORAL_TABLET | Freq: Four times a day (QID) | ORAL | Status: DC | PRN

## 2013-01-13 MED ORDER — LEVALBUTEROL HCL 1.25 MG/0.5ML IN NEBU
1.2500 mg | INHALATION_SOLUTION | RESPIRATORY_TRACT | Status: DC | PRN
  Administered 2013-01-13: 1.25 mg via RESPIRATORY_TRACT
  Filled 2013-01-13 (×3): qty 0.5

## 2013-01-13 MED ORDER — ALBUTEROL (5 MG/ML) CONTINUOUS INHALATION SOLN
15.0000 mg/h | INHALATION_SOLUTION | Freq: Once | RESPIRATORY_TRACT | Status: AC
  Administered 2013-01-13: 15 mg/h via RESPIRATORY_TRACT
  Filled 2013-01-13: qty 20

## 2013-01-13 MED ORDER — PNEUMOCOCCAL VAC POLYVALENT 25 MCG/0.5ML IJ INJ
0.5000 mL | INJECTION | INTRAMUSCULAR | Status: AC
  Administered 2013-01-14: 0.5 mL via INTRAMUSCULAR
  Filled 2013-01-13 (×2): qty 0.5

## 2013-01-13 MED ORDER — BENZONATATE 100 MG PO CAPS
100.0000 mg | ORAL_CAPSULE | Freq: Three times a day (TID) | ORAL | Status: DC
  Administered 2013-01-13 – 2013-01-15 (×5): 100 mg via ORAL
  Filled 2013-01-13 (×7): qty 1

## 2013-01-13 MED ORDER — DOCUSATE SODIUM 100 MG PO CAPS
100.0000 mg | ORAL_CAPSULE | Freq: Two times a day (BID) | ORAL | Status: DC
  Administered 2013-01-13 – 2013-01-15 (×4): 100 mg via ORAL
  Filled 2013-01-13 (×5): qty 1

## 2013-01-13 MED ORDER — ALBUTEROL (5 MG/ML) CONTINUOUS INHALATION SOLN
15.0000 mg/h | INHALATION_SOLUTION | Freq: Once | RESPIRATORY_TRACT | Status: AC
  Administered 2013-01-13: 15 mg/h via RESPIRATORY_TRACT

## 2013-01-13 MED ORDER — ACETAMINOPHEN 650 MG RE SUPP: 650.0000 mg | Freq: Four times a day (QID) | RECTAL | Status: DC | PRN

## 2013-01-13 MED ORDER — PREDNISONE 20 MG PO TABS
60.0000 mg | ORAL_TABLET | Freq: Once | ORAL | Status: AC
  Administered 2013-01-13: 60 mg via ORAL
  Filled 2013-01-13: qty 3

## 2013-01-13 MED ORDER — ALBUTEROL (5 MG/ML) CONTINUOUS INHALATION SOLN
INHALATION_SOLUTION | RESPIRATORY_TRACT | Status: AC
  Filled 2013-01-13: qty 20

## 2013-01-13 MED ORDER — ACETAMINOPHEN 325 MG PO TABS: 650.0000 mg | ORAL_TABLET | Freq: Four times a day (QID) | ORAL | Status: DC | PRN

## 2013-01-13 MED ORDER — GUAIFENESIN-DM 100-10 MG/5ML PO SYRP
10.0000 mL | ORAL_SOLUTION | ORAL | Status: DC | PRN
  Administered 2013-01-13: 10 mL via ORAL
  Filled 2013-01-13: qty 10

## 2013-01-13 MED ORDER — MAGNESIUM SULFATE 40 MG/ML IJ SOLN
2.0000 g | Freq: Once | INTRAMUSCULAR | Status: AC
  Administered 2013-01-13: 2 g via INTRAVENOUS
  Filled 2013-01-13: qty 50

## 2013-01-13 MED ORDER — LEVOFLOXACIN IN D5W 750 MG/150ML IV SOLN
750.0000 mg | INTRAVENOUS | Status: DC
  Administered 2013-01-13 – 2013-01-14 (×2): 750 mg via INTRAVENOUS
  Filled 2013-01-13 (×4): qty 150

## 2013-01-13 MED ORDER — POTASSIUM CHLORIDE 10 MEQ/100ML IV SOLN
10.0000 meq | INTRAVENOUS | Status: AC
  Administered 2013-01-13: 10 meq via INTRAVENOUS
  Filled 2013-01-13 (×2): qty 100

## 2013-01-13 MED ORDER — MORPHINE SULFATE 4 MG/ML IJ SOLN
4.0000 mg | Freq: Once | INTRAMUSCULAR | Status: AC
  Administered 2013-01-13: 4 mg via INTRAVENOUS
  Filled 2013-01-13: qty 1

## 2013-01-13 MED ORDER — PANTOPRAZOLE SODIUM 40 MG PO TBEC
40.0000 mg | DELAYED_RELEASE_TABLET | Freq: Every day | ORAL | Status: DC
  Administered 2013-01-14 – 2013-01-15 (×2): 40 mg via ORAL
  Filled 2013-01-13 (×2): qty 1

## 2013-01-13 MED ORDER — SODIUM CHLORIDE 0.9 % IV BOLUS (SEPSIS)
1000.0000 mL | Freq: Once | INTRAVENOUS | Status: AC
  Administered 2013-01-13: 1000 mL via INTRAVENOUS

## 2013-01-13 MED ORDER — MAGNESIUM SULFATE 50 % IJ SOLN: 2.0000 g | Freq: Once | INTRAMUSCULAR | Status: DC

## 2013-01-13 MED ORDER — FLUTICASONE PROPIONATE 50 MCG/ACT NA SUSP
2.0000 | Freq: Every day | NASAL | Status: DC
  Administered 2013-01-14 – 2013-01-15 (×2): 2 via NASAL
  Filled 2013-01-13: qty 16

## 2013-01-13 MED ORDER — LEVALBUTEROL HCL 1.25 MG/0.5ML IN NEBU
1.2500 mg | INHALATION_SOLUTION | RESPIRATORY_TRACT | Status: DC
  Administered 2013-01-13 – 2013-01-15 (×9): 1.25 mg via RESPIRATORY_TRACT
  Filled 2013-01-13 (×19): qty 0.5

## 2013-01-13 MED ORDER — INFLUENZA VAC SPLIT QUAD 0.5 ML IM SUSP
0.5000 mL | INTRAMUSCULAR | Status: AC
  Administered 2013-01-14: 0.5 mL via INTRAMUSCULAR
  Filled 2013-01-13 (×2): qty 0.5

## 2013-01-13 MED ORDER — ENOXAPARIN SODIUM 40 MG/0.4ML ~~LOC~~ SOLN
40.0000 mg | SUBCUTANEOUS | Status: DC
  Administered 2013-01-13 – 2013-01-14 (×2): 40 mg via SUBCUTANEOUS
  Filled 2013-01-13 (×4): qty 0.4

## 2013-01-13 MED ORDER — CYCLOBENZAPRINE HCL 10 MG PO TABS
10.0000 mg | ORAL_TABLET | Freq: Two times a day (BID) | ORAL | Status: DC | PRN
  Administered 2013-01-15: 10 mg via ORAL
  Filled 2013-01-13 (×2): qty 1

## 2013-01-13 NOTE — ED Provider Notes (Signed)
CSN: [id number]     Arrival date & time 01/13/13  4098 History   First MD Initiated Contact with Patient 01/13/13 [phone]     Chief Complaint  Patient presents with  . Shortness of Breath  . Chest Pain   (Consider location/radiation/quality/duration/timing/severity/associated sxs/prior Treatment) HPI Comments: 38 year old female presents with 3-4 days of shortness of breath and now chest pain. She states that this does feel similar to prior asthma exacerbations. She states this is not as bad as the time where she was "put on life support". She also has a history of "collapsed lung" and states is not similar to that. She's been having fevers, most recently 101 last night. She is been having diffuse body aches, headache, and cough productive of dark sputum. She ran out of her albuterol at home. She denies any focal leg swelling. Otherwise this does feel like similar asthma exacerbations. She rates the pain in the chest as 10/10. No prior history of blood clots.   Past Medical History  Diagnosis Date  . Heart murmur   . Left ventricular dysfunction     Decreased; Low E 20-25% in the setting of severe illness requiring ICU care 06/2008; Cardiolyte 06/13/2009 normal perfusion w/o significant ischemia or scar; LVEF 38%  . Respiratory failure     Ventilator Dependent; 2/2 status asthmatics 6/11-18/2010 admit [facility]  . HTN (hypertension)     Try off ACE 06/13/2009 due to pseudoasthma  . Vision disorder   . Anemia, iron deficiency   . Asthma     vs Pseudoasthma from ACE; Trial off ACE > PFT's normal 09/09/09 x DLCO 59 > corrects to 73%  . Depression    Past Surgical History  Procedure Laterality Date  . Dilation and curettage of uterus     Family History  Problem Relation Age of Onset  . Diabetes Mother 26  . Cancer Mother     unknown   . Diabetes type II Brother 20   History  Substance Use Topics  . Smoking status: Current Every Day Smoker -- 0.50 packs/day for 19 years    Types: Cigarettes  .  Smokeless tobacco: Never Used  . Alcohol Use: Yes     Comment: occasionally   OB History   Grav Para Term Preterm Abortions TAB SAB Ect Mult Living                 Review of Systems  Constitutional: Positive for fever and chills.  Respiratory: Positive for cough and shortness of breath.   Cardiovascular: Positive for chest pain. Negative for leg swelling.  Gastrointestinal: Negative for vomiting and abdominal pain.  Musculoskeletal: Positive for myalgias.  Neurological: Positive for headaches.  All other systems reviewed and are negative.    Allergies  Peanut-containing drug products  Home Medications   Current Outpatient Rx  Name  Route  Sig  Dispense  Refill  . albuterol (PROVENTIL HFA;VENTOLIN HFA) 108 (90 BASE) MCG/ACT inhaler   Inhalation   Inhale 2 puffs into the lungs every 6 (six) hours as needed. For shortness of breath         . cyclobenzaprine (FLEXERIL) 10 MG tablet   Oral   Take 10 mg by mouth 2 (two) times daily as needed. For muscle cramps         . DULoxetine (CYMBALTA) 60 MG capsule   Oral   Take 60 mg by mouth daily.         . ferrous sulfate 325 (65 FE) MG tablet  Oral   Take 325 mg by mouth 3 (three) times daily with meals.         . fluticasone (FLONASE) 50 MCG/ACT nasal spray   Each Nare   Place 2 sprays into both nostrils daily.   16 g   2   . Fluticasone-Salmeterol (ADVAIR) 250-50 MCG/DOSE AEPB   Inhalation   Inhale 1 puff into the lungs 2 (two) times daily.         Marland Kitchen guaiFENesin (MUCINEX) 600 MG 12 hr tablet   Oral   Take 1 tablet (600 mg total) by mouth 2 (two) times daily.   12 tablet   0   . losartan (COZAAR) 25 MG tablet   Oral   Take 25 mg by mouth daily as needed. When seeing black spots.         . medroxyPROGESTERone (DEPO-PROVERA) 150 MG/ML injection   Intramuscular   Inject 150 mg into the muscle every 3 (three) months. Next due March 2014         . QUEtiapine (SEROQUEL) 50 MG tablet   Oral   Take 50  mg by mouth at bedtime.         . valsartan (DIOVAN) 160 MG tablet   Oral   Take 160 mg by mouth daily as needed. For chest pain          BP 147/100  Pulse 130  Temp(Src) 98.3 F (36.8 C) (Oral)  Resp 23  SpO2 92% Physical Exam  Nursing note and vitals reviewed. Constitutional: She is oriented to person, place, and time. She appears well-developed and well-nourished.  HENT:  Head: Normocephalic and atraumatic.  Right Ear: External ear normal.  Left Ear: External ear normal.  Nose: Nose normal.  Eyes: Right eye exhibits no discharge. Left eye exhibits no discharge.  Cardiovascular: Regular rhythm and normal heart sounds.  Tachycardia present.   Pulmonary/Chest: Tachypnea noted. She has decreased breath sounds. She has wheezes. She exhibits tenderness.  Abdominal: Soft. There is no tenderness.  Neurological: She is alert and oriented to person, place, and time.  Skin: Skin is warm and dry.    ED Course  Procedures (including critical care time) Labs Review Labs Reviewed - No data to display Imaging Review No results found.  EKG Interpretation    Date/Time:  Tuesday January 13 2013 07:43:30 EST Ventricular Rate:  110 PR Interval:  115 QRS Duration: 95 QT Interval:  335 QTC Calculation: 453 R Axis:   -67 Text Interpretation:  Sinus tachycardia Multiform ventricular premature complexes LAD, consider left anterior fascicular block RSR' in V1 or V2, probably normal variant Left ventricular hypertrophy No significant change since last tracing Confirmed by Talon Regala  MD, Shaira Sova ([id number]) on 01/13/2013 7:52:09 AM           CRITICAL CARE Performed by: Pricilla Loveless T   Total critical care time: 30 minutes  Critical care time was exclusive of separately billable procedures and treating other patients.  Critical care was necessary to treat or prevent imminent or life-threatening deterioration.  Critical care was time spent personally by me on the following  activities: development of treatment plan with patient and/or surrogate as well as nursing, discussions with consultants, evaluation of patient's response to treatment, examination of patient, obtaining history from patient or surrogate, ordering and performing treatments and interventions, ordering and review of laboratory studies, ordering and review of radiographic studies, pulse oximetry and re-evaluation of patient's condition.  MDM   1. Status asthmaticus  Patient with significant respiratory distress on initial eval, improved somewhat with continuous albuterol and atrovent. Given steroids, fluids. CXR negative. Still with increased WOB and hypoxia (89% RA) after first hour, so was given magnesium, repeat albuterol. Due to this will need admission for continued respiratory support.    Audree CamelScott T Yuri Fana, MD 01/13/13 1729

## 2013-01-13 NOTE — Progress Notes (Signed)
ANTIBIOTIC CONSULT NOTE - INITIAL  Pharmacy Consult for Levaquin Indication: Asthma, COPD exacerbation, fever  Allergies  Allergen Reactions  . Peanut-Containing Drug Products     REACTION: swell up    Patient Measurements:    Vital Signs: Temp: 97.6 F (36.4 C) (01/06 1600) Temp src: Oral (01/06 1600) BP: 120/97 mmHg (01/06 1401) Pulse Rate: 122 (01/06 1401)  Labs:  Recent Labs  01/13/13 1102  WBC 9.5  HGB 13.5  PLT 123*  CREATININE 0.84   The CrCl is unknown because both a height and weight (above a minimum accepted value) are required for this calculation. CrCl ~ 105 ml/min Normalized  Microbiology: No results found for this or any previous visit (from the past 720 hour(s)).  Medical History: Past Medical History  Diagnosis Date  . Heart murmur   . Left ventricular dysfunction     Decreased; Low E 20-25% in the setting of severe illness requiring ICU care 06/2008; Cardiolyte 06/13/2009 normal perfusion w/o significant ischemia or scar; LVEF 38%  . Respiratory failure     Ventilator Dependent; 2/2 status asthmatics 6/11-18/2010 admit [facility]  . HTN (hypertension)     Try off ACE 06/13/2009 due to pseudoasthma  . Vision disorder   . Anemia, iron deficiency   . Asthma     vs Pseudoasthma from ACE; Trial off ACE > PFT's normal 09/09/09 x DLCO 59 > corrects to 73%  . Depression   . Insomnia     Medications:  Anti-infectives   Start     Dose/Rate Route Frequency Ordered Stop   01/13/13 1800  levofloxacin (LEVAQUIN) IVPB 750 mg     750 mg 100 mL/hr over 90 Minutes Intravenous Every 24 hours 01/13/13 1706       Assessment: 37 yoF with history of asthma admitted 1/6 with status asthmaticus, persistent WOB, possible chronic bronchitis and/or COPD (from current smoking).  Pharmacy consulted to dose Levaquin.  Tmax: 98.3  WBCs: 9.5   Renal: SCr 0.84  Goal of Therapy:  Appropriate abx dosing, eradication of infection.   Plan:   Levaquin 750mg  IV q24h  Follow  up renal fxn and culture results.   Lynann Beaverhristine Oreoluwa Gilmer PharmD, BCPS Pager [phone] 01/13/2013 5:54 PM

## 2013-01-13 NOTE — H&P (Addendum)
[facility] History and Physical  Rebecca Holmes NWG:[id number] DOB: 1975/12/15 DOA: 01/13/2013  Referring physician:  Pricilla Loveless PCP:  Kennis Carina, MD   Chief Complaint:  SOB, fever  HPI:  The patient is a 38 y.o. 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    The patient was last at their baseline health about 5 days prior to admission.  She states she developed a cough and wheeze approximately 4 days prior to admission. Cough has been productive of a clear sputum. Yesterday she developed fevers to approximately 101 Fahrenheit. She did not have her flu shot this year. She denies sore throat, sinus congestion. She ran out of her asthma medications approximately one month ago. She borrowed her daughter's albuterol, which initially helped a little bit but recently has not helped at all. She came to the emergency department today because of severe dyspnea.  She had oxygen saturations in the mid to high 80 on room air.  She denies orthopnea, LEE.  Has not slept due to difficulty breathing.    In the emergency department, she received prednisone 60 mg once, normal saline bolus, morphine 4 mg, magnesium 2 g, and feeling x1 and a continuous med 50 mg per hour x2 hours.  She continued to have shortness of breath and increased work of breathing, tachypnea to the high 20s. She is currently having oxygen saturations in the mid-90s on room air. She states it is in his nebulizer machine is turned off, she starts to have increased work of breathing.  Review of Systems:  General:  Positive fevers, chills, weight loss or gain HEENT:  Denies changes to hearing and vision, rhinorrhea, sinus congestion, sore throat CV:  Chest tightness which resolved with breathing  treatment.  PULM: Per history of present illness GI:  Denies nausea, vomiting, constipation, diarrhea.   GU:  Denies dysuria, frequency, urgency ENDO:  Denies polyuria, polydipsia.   HEME:  Denies hematemesis, blood in stools, melena, abnormal bruising or bleeding.  LYMPH:  Denies lymphadenopathy.   MSK:  Denies arthralgias, myalgias.   DERM:  Denies skin rash or ulcer.   NEURO:  Denies focal numbness, weakness, slurred speech, confusion, facial droop.  PSYCH:  Denies anxiety and depression.    Past Medical History  Diagnosis Date  . Heart murmur   . Left ventricular dysfunction     Decreased; Low E 20-25% in the setting of severe illness requiring ICU care 06/2008; Cardiolyte 06/13/2009 normal perfusion w/o significant ischemia or scar; LVEF 38%  . Respiratory failure     Ventilator Dependent; 2/2 status asthmatics 6/11-18/2010 admit [facility]  . HTN (hypertension)     Try off ACE 06/13/2009 due to pseudoasthma  . Vision disorder   . Anemia, iron deficiency   . Asthma     vs Pseudoasthma from ACE; Trial off ACE > PFT's normal 09/09/09 x DLCO 59 > corrects to 73%  . Depression   . Insomnia    Past Surgical History  Procedure Laterality Date  . Dilation and curettage of uterus  1997   Social History:  reports that she has been smoking Cigarettes.  She has a 9.5 pack-year smoking history. She has never used smokeless tobacco. She reports that she drinks alcohol. She reports that she does not use illicit drugs. Patient is currently  not working, Lives with her boy friend in [place]. 5 kids, 3 boys, 2 girls.  She was previously seen by internal medicine, but her last appointment with them was in December 2013. She no longer had refills on most of her medications and therefore they ran out about one month ago. She does not have health insurance currently.  Allergies  Allergen Reactions  . Peanut-Containing Drug Products     REACTION: swell up    Family History  Problem Relation Age of Onset  .  Diabetes Mother 106  . Cancer Mother     unknown   . Diabetes type II Brother 20  . Asthma Maternal Grandfather   . Asthma Daughter      Prior to Admission medications   Medication Sig Start Date End Date Taking? Authorizing Provider  acetaminophen (TYLENOL) 500 MG tablet Take 1,000 mg by mouth every 6 (six) hours as needed for moderate pain or headache.   Yes Historical Provider, MD  albuterol (PROVENTIL HFA;VENTOLIN HFA) 108 (90 BASE) MCG/ACT inhaler Inhale 2 puffs into the lungs every 6 (six) hours as needed. For shortness of breath 12/18/11  Yes Maitri S Kalia-Reynolds, DO  cyclobenzaprine (FLEXERIL) 10 MG tablet Take 10 mg by mouth 2 (two) times daily as needed for muscle spasms.    Yes Maitri S Kalia-Reynolds, DO  DULoxetine (CYMBALTA) 60 MG capsule Take 60 mg by mouth every morning.    Yes Maitri S Kalia-Reynolds, DO  Fluticasone-Salmeterol (ADVAIR) 250-50 MCG/DOSE AEPB Inhale 1 puff into the lungs 2 (two) times daily.   Yes Maitri S Kalia-Reynolds, DO  guaiFENesin (MUCINEX) 600 MG 12 hr tablet Take 1 tablet (600 mg total) by mouth 2 (two) times daily. 11/27/12  Yes Trevor Mace, PA-C  losartan (COZAAR) 25 MG tablet Take 25 mg by mouth daily as needed (chest pain).    Yes Historical Provider, MD  QUEtiapine (SEROQUEL) 50 MG tablet Take 50 mg by mouth at bedtime.   Yes Maitri S Kalia-Reynolds, DO  fluticasone (FLONASE) 50 MCG/ACT nasal spray Place 2 sprays into both nostrils daily. 11/27/12   Trevor Mace, PA-C   Physical Exam: Filed Vitals:   01/13/13 1000 01/13/13 1030 01/13/13 1100 01/13/13 1115  BP:   122/80   Pulse: 126 131 127 135  Temp:      TempSrc:      Resp: 29 22 25 23   SpO2: 92% 89% 99% 99%     General:  Thin [place] female, moderate respiratory distress with SEM, suprasternal, supraclavicular, intercostal, subcostal retractions. She is able to speak in Catheryne Deford phrases and sentences.  Eyes:  PERRL, anicteric, non-injected.  ENT:  Nares clear.  OP clear,  non-erythematous without plaques or exudates.  MMM.  Neck:  Supple without TM or JVD.    Lymph:  No cervical, supraclavicular, or submandibular LAD.  Cardiovascular:  RRR, normal S1, S2, without m/r/g.  2+ pulses, warm extremities  Respiratory:  Very diminished bilateral breath sounds with full expiratory wheeze throughout, no focal rales, positive rhonchi  Abdomen:  NABS.  Soft, ND/NT.    Skin:  No rashes or focal lesions.  Musculoskeletal:  Normal bulk and tone.  No LE edema.  Psychiatric:  A & O x 4.  Appropriate affect.  Neurologic:  CN 3-12 intact.  5/5 strength.  Sensation intact.  Labs on Admission:  Basic Metabolic Panel: No results found for this basename: NA, K, CL, CO2, GLUCOSE, BUN, CREATININE, CALCIUM, MG, PHOS,  in the last 168 hours  Liver Function Tests: No results found for this basename: AST, ALT, ALKPHOS, BILITOT, PROT, ALBUMIN,  in the last 168 hours No results found for this basename: LIPASE, AMYLASE,  in the last 168 hours No results found for this basename: AMMONIA,  in the last 168 hours CBC:  Recent Labs Lab 01/13/13 1102  WBC 9.5  NEUTROABS 8.7*  HGB 13.5  HCT 38.0  MCV 89.0  PLT 123*   Cardiac Enzymes: No results found for this basename: CKTOTAL, CKMB, CKMBINDEX, TROPONINI,  in the last 168 hours  BNP (last 3 results) No results found for this basename: PROBNP,  in the last 8760 hours CBG: No results found for this basename: GLUCAP,  in the last 168 hours  Radiological Exams on Admission: Dg Chest Portable 1 View  01/13/2013   CLINICAL DATA:  Cough and shortness of breath.  Fever.  EXAM: PORTABLE CHEST - 1 VIEW  COMPARISON:  Two-view chest 11/27/2012  FINDINGS: The heart size is normal. The lungs are clear. The visualized soft tissues and bony thorax are unremarkable.  IMPRESSION: Negative two view chest.   Electronically Signed   By: Gennette Pachris  Mattern M.D.   On: 01/13/2013 08:53    EKG: Independently reviewed. NSR, sinus tach, LAD, LAFB, LVH,  no acute ischemic changes, stable from prior  Assessment/Plan Principal Problem:   Status asthmaticus Active Problems:   ANEMIA, IRON DEFICIENCY   HYPERTENSION, BENIGN   Congestive heart failure   Esophageal reflux   Tobacco abuse  ---  Status asthmaticus, persistent increased WOB despite steroids and nebs in ER.  May be developing some chronic bronchitis/COPD from smoking so will treat with antibiotics also -  Admit to stepdown -  Solumedrol 60mg  IV q6h -  BD q4h -  Albuterol q2h prn -  Tessalon and guafen prn -  Levofloxacin   Fever, no PNA on cxr, no dysuria -  F/u flu PCR -  Droplet -  Start tamiflu if positive  Sinus tachycardia, secondary to acute illness and albuterol -  Minimize albuterol if possible  Tobacco abuse -  Declined patch -  Nurse to provide education  No insurance and no PCP and unclear childcare plan  -  SW, CM, financial counselor consults  HTN, BP may be low due to albuterol -  Hold antihypertensive  Combined systolic and diastolic heart failure -  No evidence of volume overload on CXR  Thrombocytopenia, mild, acute phase reactant  Diet:  CLD, advance as tolearted Access:  PIV IVF:  yes Proph:  lovenox  Code Status: full Family Communication: patient alone Disposition Plan: Admit to stepdown  Time spent: 60 min Renae FickleSHORT, Hidaya Daniel [facility] Pager [phone]  If 7PM-7AM, please contact night-coverage [email or web address] Password [email or web address] 01/13/2013, 12:14 PM

## 2013-01-13 NOTE — ED Notes (Addendum)
Per EMS, Pt, from home, c/o SOB and centralized chest tightness x 3-4 days.  Hx of asthma and chronic bronchitis.  Pt reports she has run out of her inhaler.

## 2013-01-13 NOTE — Progress Notes (Signed)
   CARE MANAGEMENT ED NOTE 01/13/2013  Patient:  Rebecca Holmes,Rebecca Holmes   Account Number:  [id number]  Date Initiated:  01/13/2013  Documentation initiated by:  Edd ArbourGIBBS,Yissel Habermehl  Subjective/Objective Assessment:   38 yr old med pay assurance [place] pt pcp listed as Dr Mariea ClontsEmokpae from home, c/Holmes SOB and centralized chest tightness x 3-4 days.  Hx of asthma and chronic bronchitis.  Pt reports she has run out of her inhaler     Subjective/Objective Assessment Detail:     Action/Plan:   Cm reviewed EPIC notes, reviewed CM consult Cm spoke with pt CM provided resources see note below   Action/Plan Detail:   Anticipated DC Date:  01/16/2013     Status Recommendation to Physician:   Result of Recommendation:    Other ED Services  Consult Working Plan    DC Planning Services  Other  PCP issues  Outpatient Services - Pt will follow up  Medication Assistance [facility] / [facility] (established/new)    Choice offered to / List presented to:            Status of service:  Completed, signed off  ED Comments:   ED Comments Detail:  CM spoke with pt who [place] [facility] (varying M/ resident). CM discussed and provided written information for self pay pcps, importance of pcp for f/u care, www.needymeds.org, discounted pharmacies and other [place] resources such as financial assistance, DSS and health department  Reviewed resources for [place] self pay pcps like [facility], [facility] medicine at [address], [facility], general medical clinics, [facility] plus others, [facility] out patient pharmacies and housing Pt voiced understanding and appreciation of resources provided Pt able to use the teach back method to verify understanding of resources/information provided Pt also seen by [facility] staff CM provided pt with pt assistance application for albuterol and contact information for [facility] [place] pt  assistance program office Discussed having pcp to change to generic or discounted pharmacy $4 program medications  Pt provided with a list of [place] health agencies to assist with possible nebulizer and discussed retail drug store also have them with out of pocket expense

## 2013-01-13 NOTE — Progress Notes (Signed)
UR completed 

## 2013-01-13 NOTE — ED Notes (Signed)
Bed: [id number] Expected date:  Expected time:  Means of arrival:  Comments: sob

## 2013-01-13 NOTE — Progress Notes (Signed)
[facility] provided pt with a list of primary care resources, [facility] [place] application, and ACA information.  °

## 2013-01-14 DIAGNOSIS — M549 Dorsalgia, unspecified: Secondary | ICD-10-CM

## 2013-01-14 DIAGNOSIS — I369 Nonrheumatic tricuspid valve disorder, unspecified: Secondary | ICD-10-CM

## 2013-01-14 DIAGNOSIS — J45901 Unspecified asthma with (acute) exacerbation: Secondary | ICD-10-CM

## 2013-01-14 LAB — BASIC METABOLIC PANEL
BUN: 7 mg/dL (ref 6–23)
CHLORIDE: 105 meq/L (ref 96–112)
CO2: 16 mEq/L — ABNORMAL LOW (ref 19–32)
Calcium: 9.7 mg/dL (ref 8.4–10.5)
Creatinine, Ser: 0.78 mg/dL (ref 0.50–1.10)
GFR calc Af Amer: >90 mL/min (ref >90)
GFR calc non Af Amer: >90 mL/min (ref >90)
Glucose, Bld: 146 mg/dL — ABNORMAL HIGH (ref 70–99)
Potassium: 4.5 mEq/L (ref 3.7–5.3)
SODIUM: 135 meq/L — AB (ref 137–147)

## 2013-01-14 LAB — INFLUENZA PANEL BY PCR (TYPE A & B)
H1N1 flu by pcr: NOT DETECTED
INFLAPCR: POSITIVE — AB
Influenza B By PCR: NEGATIVE

## 2013-01-14 MED ORDER — OSELTAMIVIR PHOSPHATE 75 MG PO CAPS
75.0000 mg | ORAL_CAPSULE | Freq: Two times a day (BID) | ORAL | Status: DC
  Administered 2013-01-14 – 2013-01-15 (×3): 75 mg via ORAL
  Filled 2013-01-14 (×4): qty 1

## 2013-01-14 MED ORDER — METHYLPREDNISOLONE SODIUM SUCC 125 MG IJ SOLR
60.0000 mg | Freq: Two times a day (BID) | INTRAMUSCULAR | Status: DC
  Administered 2013-01-14: 60 mg via INTRAVENOUS
  Filled 2013-01-14 (×3): qty 0.96

## 2013-01-14 NOTE — Progress Notes (Signed)
Patient ID: Rebecca Holmes, female   DOB: January 07, 1976, 38 y.o.   MRN: [id number]  [facility] PROGRESS NOTE  Rebecca Holmes ION:[id number] DOB: 1975/11/16 DOA: 01/13/2013 PCP: Kennis Carina, MD  Brief narrative: 38 y.o. 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 She denied any specific aggravating or alleviating factors, no chest pain.   In the emergency department, she received prednisone 60 mg once, normal saline bolus, morphine 4 mg, magnesium 2 g. She continued to have shortness of breath and increased work of breathing, tachypnea to the high 20s.   Principal Problem:   Acute on chronic respiratory failure - this appears to be multifactorial and secondary to asthma exacerbation, PNA, flu - CXR with no specific cardiopulmonary findings but Influenza A positive - pt is clinically improving and lung exam is relatively benign, pt does report exertional SOB  - will continue Levaquin day #2, Tamflu started today - continue BD's scheduled and as needed, Steroids with planned tapering, oxygen via  Active Problems:   HYPERTENSION, BENIGN - reasonable inpatient control    Congestive heart failure - no crackles on exam, clinically compensated at this time - continue to monitor daily weights, I's and O's   ANEMIA, IRON DEFICIENCY - stable Hg/Hct, no signs of active bleeding - CBC in AM   Esophageal reflux - stable, continue Protonix   Consultants:  None Procedures/Studies: Dg Chest Portable 1 View   01/13/2013   Negative two view chest.   Antibiotics/Antiviral:  Levaquin 01/06 -->    Tamiflu 01/07 -->  Code Status: Full Family Communication: Pt at bedside Disposition Plan: Transfer to telemetry bed   HPI/Subjective: No events overnight.   Objective: Filed Vitals:   01/14/13 1244 01/14/13 1300 01/14/13 1400 01/14/13 1500  BP:  128/102 118/98 116/86  Pulse:  104 93 93  Temp:      TempSrc:      Resp:  16 16 22   Height:      Weight:      SpO2: 96% 98% 98% 97%    Intake/Output Summary (Last 24 hours) at 01/14/13 1627 Last data filed at 01/14/13 0700  Gross per 24 hour  Intake    870 ml  Output   1550 ml  Net   -680 ml    Exam:   General:  Pt is alert, follows commands appropriately, not in acute distress  Cardiovascular: Regular rhythm, tachycardic, S1/S2, no murmurs, no rubs, no gallops  Respiratory: Clear to auscultation bilaterally, no wheezing, no crackles, diminished breath sounds at bases   Abdomen: Soft, non tender, non distended, bowel sounds present, no guarding  Extremities: No edema, pulses DP and PT palpable bilaterally  Neuro: Grossly nonfocal  Data Reviewed: Basic Metabolic Panel:  Recent Labs Lab 01/13/13 1102 01/14/13 0320  NA 137 135*  K 2.5* 4.5  CL 97 105  CO2 14* 16*  GLUCOSE 222* 146*  BUN 7 7  CREATININE 0.84 0.78  CALCIUM 8.8 9.7   CBC:  Recent Labs Lab 01/13/13 1102  WBC 9.5  NEUTROABS 8.7*  HGB 13.5  HCT 38.0  MCV 89.0  PLT 123*    Recent Results (from the past  240 hour(s))  MRSA PCR SCREENING     Status: None   Collection Time    01/13/13  5:38 PM      Result Value Range Status   MRSA by PCR NEGATIVE  NEGATIVE Final   Comment:            The GeneXpert MRSA Assay (FDA     approved for NASAL specimens     only), is one component of a     comprehensive MRSA colonization     surveillance program. It is not     intended to diagnose MRSA     infection nor to guide or     monitor treatment for     MRSA infections.     Scheduled Meds: . benzonatate  100 mg Oral TID  . docusate sodium   100 mg Oral BID  . DULoxetine  60 mg Oral q morning - 10a  . enoxaparin  injection  40 mg Subcutaneous Q24H  . fluticasone  2 spray Each Nare Daily  . ipratropium  0.5 mg Nebulization Q4H  . levalbuterol  1.25 mg Nebulization Q4H  . levofloxacin  IV  750 mg Intravenous Q24H  . methylPREDNISolone  inj  60 mg Intravenous Q6H  . oseltamivir  75 mg Oral BID  . pantoprazole  40 mg Oral Daily  . QUEtiapine  50 mg Oral QHS   Continuous Infusions: . sodium chloride     Debbora PrestoMAGICK-MYERS, ISKRA, MD  [facility] Pager [phone]  If 7PM-7AM, please contact night-coverage [email or web address] Password [email or web address] 01/14/2013, 4:27 PM   LOS: 1 day

## 2013-01-14 NOTE — Progress Notes (Signed)
  Echocardiogram 2D Echocardiogram has been performed.  Rebecca BeamsGREGORY, [name] 01/14/2013, 10:48 AM

## 2013-01-15 DIAGNOSIS — D509 Iron deficiency anemia, unspecified: Secondary | ICD-10-CM

## 2013-01-15 DIAGNOSIS — J45909 Unspecified asthma, uncomplicated: Secondary | ICD-10-CM

## 2013-01-15 LAB — BASIC METABOLIC PANEL
BUN: 14 mg/dL (ref 6–23)
CO2: 19 meq/L (ref 19–32)
Calcium: 9.8 mg/dL (ref 8.4–10.5)
Chloride: 103 mEq/L (ref 96–112)
Creatinine, Ser: 0.86 mg/dL (ref 0.50–1.10)
GFR calc Af Amer: >90 mL/min (ref >90)
GFR calc non Af Amer: 85 mL/min — ABNORMAL LOW (ref >90)
Glucose, Bld: 108 mg/dL — ABNORMAL HIGH (ref 70–99)
Potassium: 5 mEq/L (ref 3.7–5.3)
SODIUM: 136 meq/L — AB (ref 137–147)

## 2013-01-15 LAB — CBC
HCT: 37.5 % (ref 36.0–46.0)
HEMOGLOBIN: 13 g/dL (ref 12.0–15.0)
MCH: 31.1 pg (ref 26.0–34.0)
MCHC: 34.7 g/dL (ref 30.0–36.0)
MCV: 89.7 fL (ref 78.0–100.0)
Platelets: 132 10*3/uL — ABNORMAL LOW (ref 150–400)
RBC: 4.18 MIL/uL (ref 3.87–5.11)
RDW: 15.1 % (ref 11.5–15.5)
WBC: 26 10*3/uL — AB (ref 4.0–10.5)

## 2013-01-15 MED ORDER — CYCLOBENZAPRINE HCL 10 MG PO TABS: 10.0000 mg | ORAL_TABLET | Freq: Two times a day (BID) | ORAL | Status: DC | PRN

## 2013-01-15 MED ORDER — PREDNISONE 50 MG PO TABS: ORAL_TABLET | ORAL | Status: DC

## 2013-01-15 MED ORDER — ALBUTEROL SULFATE HFA 108 (90 BASE) MCG/ACT IN AERS: 2.0000 | INHALATION_SPRAY | Freq: Four times a day (QID) | RESPIRATORY_TRACT | Status: DC | PRN

## 2013-01-15 MED ORDER — IPRATROPIUM BROMIDE 0.02 % IN SOLN
0.5000 mg | Freq: Three times a day (TID) | RESPIRATORY_TRACT | Status: DC
  Filled 2013-01-15: qty 2.5

## 2013-01-15 MED ORDER — LEVALBUTEROL HCL 1.25 MG/0.5ML IN NEBU
1.2500 mg | INHALATION_SOLUTION | Freq: Three times a day (TID) | RESPIRATORY_TRACT | Status: DC
  Administered 2013-01-15: 1.25 mg via RESPIRATORY_TRACT
  Filled 2013-01-15 (×4): qty 0.5

## 2013-01-15 MED ORDER — OXYCODONE HCL 5 MG PO TABS: 5.0000 mg | ORAL_TABLET | ORAL | Status: DC | PRN

## 2013-01-15 MED ORDER — ALBUTEROL SULFATE (2.5 MG/3ML) 0.083% IN NEBU: 2.5000 mg | INHALATION_SOLUTION | Freq: Four times a day (QID) | RESPIRATORY_TRACT | Status: DC | PRN

## 2013-01-15 MED ORDER — AZITHROMYCIN 250 MG PO TABS: 250.0000 mg | ORAL_TABLET | Freq: Every day | ORAL | Status: DC

## 2013-01-15 MED ORDER — FLUTICASONE-SALMETEROL 250-50 MCG/DOSE IN AEPB: 1.0000 | INHALATION_SPRAY | Freq: Two times a day (BID) | RESPIRATORY_TRACT | Status: DC

## 2013-01-15 MED ORDER — PREDNISONE 50 MG PO TABS
50.0000 mg | ORAL_TABLET | Freq: Every day | ORAL | Status: DC
  Administered 2013-01-15: 50 mg via ORAL
  Filled 2013-01-15 (×2): qty 1

## 2013-01-15 MED ORDER — OSELTAMIVIR PHOSPHATE 75 MG PO CAPS: 75.0000 mg | ORAL_CAPSULE | Freq: Two times a day (BID) | ORAL | Status: DC

## 2013-01-15 NOTE — Progress Notes (Signed)
Patient ID: Rebecca Holmes, female   DOB: 06-Sep-1975, 38 y.o.   MRN: [id number]  [facility] PROGRESS NOTE  Rebecca Holmes:[id number] DOB: 1975/09/27 DOA: 01/13/2013 PCP: Kennis Carina, MD  Brief narrative:  38 y.o. 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 She denied any specific aggravating or alleviating factors, no chest pain.   In the emergency department, she received prednisone 60 mg once, normal saline bolus, morphine 4 mg, magnesium 2 g. She continued to have shortness of breath and increased work of breathing, tachypnea to the high 20s.   Principal Problem:  Acute on chronic respiratory failure  - this appears to be multifactorial and secondary to asthma exacerbation, PNA, flu  - CXR with no specific cardiopulmonary findings but Influenza A positive  - pt is clinically improving and lung exam is relatively benign, pt does report exertional SOB  - will continue Levaquin day #3, Tamflu day #2 - continue BD's scheduled and as needed, Steroids with planned tapering, oxygen via [facility]  Active Problems:  Tachycardic with runs of SVT - 18 beats of SVT overnight noted - pt asymptomatic - will place on Metoprolol IV as needed  HYPERTENSION, BENIGN  - reasonable inpatient control  Congestive heart failure  - no crackles on exam, clinically compensated at this time  - continue to monitor daily weights, I's and O's  - weight 144 lbs this AM ANEMIA, IRON DEFICIENCY  - stable Hg/Hct, no signs of active bleeding  - CBC in AM  Esophageal reflux  - stable, continue Protonix   Consultants:   None Procedures/Studies:  Dg Chest Portable 1 View 01/13/2013 Negative two view chest.  Antibiotics/Antiviral:  Levaquin 01/06 -->  Tamiflu 01/07 -->  Code Status: Full  Family Communication: Pt at bedside  Disposition Plan: Remains inpatient but when ready will go home   HPI/Subjective: No events overnight.   Objective: Filed Vitals:   01/14/13 2300 01/15/13 0006 01/15/13 0010 01/15/13 0607  BP: 135/108 137/105 123/88 128/97  Pulse: 89 98 95 113  Temp:   98 F (36.7 C) 97.6 F (36.4 C)  TempSrc:   Oral Oral  Resp: 22 15 20 20   Height:      Weight:    65.409 kg (144 lb 3.2 oz)  SpO2: 99% 97% 95% 95%    Intake/Output Summary (Last 24 hours) at 01/15/13 0849 Last data filed at 01/15/13 0500  Gross per 24 hour  Intake   1350 ml  Output    675 ml  Net    675 ml    Exam:   General:  Pt is alert, follows commands appropriately, not in acute distress  Cardiovascular: Regular rhythm, tachycardic, S1/S2, no murmurs, no rubs, no gallops  Respiratory: Clear to auscultation bilaterally, no wheezing, no crackles, no rhonchi  Abdomen: Soft, non tender, non distended, bowel sounds present, no guarding  Extremities: No edema, pulses DP and PT palpable bilaterally  Neuro: Grossly nonfocal  Data Reviewed: Basic Metabolic Panel:  Recent Labs Lab 01/13/13 1102 01/14/13 0320 01/15/13 0525  NA 137 135* 136*  K 2.5* 4.5 5.0  CL 97 105 103  CO2 14* 16* 19  GLUCOSE 222* 146* 108*  BUN 7 7 14   CREATININE 0.84 0.78 0.86  CALCIUM 8.8 9.7 9.8   CBC:  Recent Labs Lab 01/13/13 1102 01/15/13 0525  WBC 9.5 26.0*  NEUTROABS 8.7*  --   HGB 13.5 13.0  HCT 38.0 37.5  MCV 89.0 89.7  PLT 123* 132*    Recent Results (from the past 240 hour(s))  MRSA PCR SCREENING     Status: None   Collection Time    01/13/13  5:38 PM      Result Value Range Status   MRSA by PCR NEGATIVE  NEGATIVE Final   Comment:            The GeneXpert MRSA Assay (FDA     approved for NASAL  specimens     only), is one component of a     comprehensive MRSA colonization     surveillance program. It is not     intended to diagnose MRSA     infection nor to guide or     monitor treatment for     MRSA infections.     Scheduled Meds: . benzonatate  100 mg Oral TID  . docusate sodium  100 mg Oral BID  . DULoxetine  60 mg Oral q morning - 10a  . enoxaparin (LOVENOX) injection  40 mg Subcutaneous Q24H  . fluticasone  2 spray Each Nare Daily  . ipratropium  0.5 mg Nebulization TID  . levalbuterol  1.25 mg Nebulization TID  . levofloxacin (LEVAQUIN) IV  750 mg Intravenous Q24H  . methylPREDNISolone (SOLU-MEDROL) injection  60 mg Intravenous Q12H  . oseltamivir  75 mg Oral BID  . pantoprazole  40 mg Oral Daily  . QUEtiapine  50 mg Oral QHS   Continuous Infusions: . sodium chloride 50 mL/hr at 01/15/13 0030   Debbora PrestoMAGICK-Heather Streeper, MD  [facility] Pager [phone]  If 7PM-7AM, please contact night-coverage [email or web address] Password [email or web address] 01/15/2013, 8:49 AM   LOS: 2 days

## 2013-01-15 NOTE — Discharge Instructions (Signed)

## 2013-01-15 NOTE — Care Management Note (Signed)
    Page 1 of 1   01/15/2013     11:28:10 AM   CARE MANAGEMENT NOTE 01/15/2013  Patient:  Rebecca Holmes, Rebecca Holmes   Account Number:  [id number]  Date Initiated:  01/15/2013  Documentation initiated by:  [facility]  Subjective/Objective Assessment:   38 year old female admitted with SOB and fever.     Action/Plan:   From home.   Anticipated DC Date:  01/15/2013   Anticipated DC Plan:  [place]  CM consult  Medication Assistance  [facility]      Choice offered to / List presented to:             Status of service:  Completed, signed off Medicare Important Message given?  NA - LOS <3 / Initial given by admissions (If response is "NO", the following Medicare IM given date fields will be blank) Date Medicare IM given:   Date Additional Medicare IM given:    Discharge Disposition:  HOME/SELF CARE  Per UR Regulation:  Reviewed for med. necessity/level of care/duration of stay  If discussed at [place] of Stay Meetings, dates discussed:    Comments:  01/15/13 Allene Dillon RN BSN Met with pt at bedside to discuss follow up care and prescriptio affordability. I referred her to the [facility] and made aappointments for her for both Dr visit and orange card eligibility. I spoke with pharmacy at the clinic and they stated they will be able to fill her prescriptions today except the controlled substances. There is a copayment required and I made her aware of this.

## 2013-01-15 NOTE — Progress Notes (Signed)
18 beat V-Tach at 0608. Asymptomatic, converted back to NSR.  On call notiified, no new orders noted

## 2013-01-15 NOTE — Accreditation Note (Signed)
Patient received discharge instructions and verbalized understanding. Rebecca Holmes, CM set patient up for follow up appointments and medication assistance. Patient belongings packed. Patient to be taken downstairs via wheelchair to be discharged home. Patient had no further questions.

## 2013-01-15 NOTE — Discharge Summary (Signed)
Physician Discharge Summary  Rebecca Holmes:[id number] DOB: 05/28/1975 DOA: 01/13/2013  PCP: Kennis Carina, MD  Admit date: 01/13/2013 Discharge date: 01/15/2013  Recommendations for Outpatient Follow-up:  1. Pt will need to follow up with PCP in 2-3 weeks post discharge 2. Please obtain BMP to evaluate electrolytes and kidney function 3. Please also check CBC to evaluate Hg and Hct levels 4. Pt discharged on Zithromax and tamiflu to complete the therapy   Discharge Diagnoses: Acute hypoxic respiratory failure secondary to PNA, flu, asthma  Principal Problem:   Status asthmaticus Active Problems:   HYPERTENSION, BENIGN   Congestive heart failure   ANEMIA, IRON DEFICIENCY   Esophageal reflux   Tobacco abuse   Acute asthma exacerbation  Discharge Condition: Stable  Diet recommendation: Heart healthy diet discussed in details   Brief narrative:  38 y.o. 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 She denied any specific aggravating or alleviating factors, no chest pain.   In the emergency department, she received prednisone 60 mg once, normal saline bolus, morphine 4 mg, magnesium 2 g. She continued to have shortness of breath and increased work of breathing, tachypnea to the high 20s.   Principal Problem:  Acute on chronic respiratory failure  - this appears to be multifactorial and secondary to asthma exacerbation, PNA, flu  - CXR with no specific cardiopulmonary findings but Influenza A positive  - pt is clinically improving and lung exam is relatively benign, pt denies SOB this AM  and wants to go home  - will continue Levaquin day #4, Tamflu day #3  - continue BD's scheduled and as needed, Steroids with planned tapering - pt will be discharged on Zithromax and tamiflu to complete therapy  Active Problems:  Tachycardic with runs of SVT  - 18 beats of SVT overnight noted  - pt asymptomatic  HYPERTENSION, BENIGN  - reasonable inpatient control  Congestive heart failure  - no crackles on exam, clinically compensated at this time  - continue to monitor daily weights, I's and O's  - weight 144 lbs this AM  ANEMIA, IRON DEFICIENCY  - stable Hg/Hct, no signs of active bleeding  Esophageal reflux  - stable, continue Protonix   Consultants:  None Procedures/Studies:  Dg Chest Portable 1 View 01/13/2013 Negative two view chest.  Antibiotics/Antiviral:  Levaquin 01/06 -->  Tamiflu 01/07 -->  Code Status: Full  Family Communication: Pt at bedside    Discharge Exam: Filed Vitals:   01/15/13 0607  BP: 128/97  Pulse: 113  Temp: 97.6 F (36.4 C)  Resp: 20   Filed Vitals:   01/14/13 2300 01/15/13 0006 01/15/13 0010 01/15/13 0607  BP: 135/108 137/105 123/88 128/97  Pulse: 89 98 95 113  Temp:   98 F (36.7 C) 97.6 F (36.4 C)  TempSrc:   Oral Oral  Resp: 22 15 20 20   Height:      Weight:    65.409 kg (144 lb 3.2 oz)  SpO2: 99% 97% 95% 95%    General: Pt is alert, follows commands appropriately, not in acute distress Cardiovascular: Regular rhythm, tachycardic, S1/S2 +, no murmurs, no rubs, no gallops Respiratory: Clear to  auscultation bilaterally, no wheezing, no crackles, rhonchi at bases  Abdominal: Soft, non tender, non distended, bowel sounds +, no guarding Extremities: no edema, no cyanosis, pulses palpable bilaterally DP and PT Neuro: Grossly nonfocal  Discharge Instructions  Discharge Orders   Future Orders Complete By Expires   Diet - low sodium heart healthy  As directed    Increase activity slowly  As directed        Medication List          acetaminophen 500 MG tablet  Commonly known as:  TYLENOL  Take 1,000 mg by mouth every 6 (six) hours as needed for moderate pain or headache.     albuterol 108 (90 BASE) MCG/ACT inhaler  Commonly known as:  PROVENTIL HFA;VENTOLIN HFA  Inhale 2 puffs into the lungs every 6 (six) hours as needed. For shortness of breath     albuterol (2.5 MG/3ML) 0.083% nebulizer solution  Commonly known as:  PROVENTIL  Take 3 mLs (2.5 mg total) by nebulization every 6 (six) hours as needed for wheezing or shortness of breath.     azithromycin 250 MG tablet  Commonly known as:  ZITHROMAX  Take 1 tablet (250 mg total) by mouth daily.     cyclobenzaprine 10 MG tablet  Commonly known as:  FLEXERIL  Take 1 tablet (10 mg total) by mouth 2 (two) times daily as needed for muscle spasms.     DULoxetine 60 MG capsule  Commonly known as:  CYMBALTA  Take 60 mg by mouth every morning.     fluticasone 50 MCG/ACT nasal spray  Commonly known as:  FLONASE  Place 2 sprays into both nostrils daily.     Fluticasone-Salmeterol 250-50 MCG/DOSE Aepb  Commonly known as:  ADVAIR  Inhale 1 puff into the lungs 2 (two) times daily.     guaiFENesin 600 MG 12 hr tablet  Commonly known as:  MUCINEX  Take 1 tablet (600 mg total) by mouth 2 (two) times daily.     losartan 25 MG tablet  Commonly known as:  COZAAR  Take 25 mg by mouth daily as needed (chest pain).     oseltamivir 75 MG capsule  Commonly known as:  TAMIFLU  Take 1 capsule (75 mg total) by mouth 2 (two) times daily.     oxyCODONE 5 MG immediate release tablet  Commonly known as:  Oxy IR/ROXICODONE  Take 1 tablet (5 mg total) by mouth every 4 (four) hours as needed for moderate pain.     predniSONE 50 MG tablet  Commonly known as:  DELTASONE  Take 50 mg tablet today and taper down by 10 mg daily until completed     QUEtiapine 50 MG tablet  Commonly known as:  SEROQUEL  Take 50 mg by mouth at bedtime.           Follow-up Information    Follow up with Kennis CarinaEmokpae, [place], MD In 2 weeks.   Specialty:  Internal Medicine   Contact information:   [address] [place] [address] [phone]       Follow up with Debbora PrestoMAGICK-Jaidah Lomax, MD. (call my cell phone [phone])    Specialty:  Internal Medicine   Contact information:   [address] [place] [address] [phone]        The results of significant diagnostics from this hospitalization (including imaging, microbiology, ancillary and laboratory) are listed below for reference.     Microbiology: Recent Results (from the past 240 hour(s))  MRSA  PCR SCREENING     Status: None   Collection Time    01/13/13  5:38 PM      Result Value Range Status   MRSA by PCR NEGATIVE  NEGATIVE Final   Comment:            The GeneXpert MRSA Assay (FDA     approved for NASAL specimens     only), is one component of a     comprehensive MRSA colonization     surveillance program. It is not     intended to diagnose MRSA     infection nor to guide or     monitor treatment for     MRSA infections.     Labs: Basic Metabolic Panel:  Recent Labs Lab 01/13/13 1102 01/14/13 0320 01/15/13 0525  NA 137 135* 136*  K 2.5* 4.5 5.0  CL 97 105 103  CO2 14* 16* 19  GLUCOSE 222* 146* 108*  BUN 7 7 14   CREATININE 0.84 0.78 0.86  CALCIUM 8.8 9.7 9.8   CBC:  Recent Labs Lab 01/13/13 1102 01/15/13 0525  WBC 9.5 26.0*  NEUTROABS 8.7*  --   HGB 13.5 13.0  HCT 38.0 37.5  MCV 89.0 89.7  PLT 123* 132*   SIGNED: Time coordinating discharge: Over 30 minutes  Debbora Presto, MD  [facility] 01/15/2013, 9:31 AM Pager [phone]  If 7PM-7AM, please contact night-coverage [email or web address] Password [email or web address]

## 2013-01-26 ENCOUNTER — Inpatient Hospital Stay: Payer: Self-pay

## 2013-02-06 ENCOUNTER — Ambulatory Visit: Payer: Self-pay

## 2013-02-20 ENCOUNTER — Other Ambulatory Visit: Payer: Self-pay | Admitting: Cardiovascular Disease

## 2013-04-07 ENCOUNTER — Telehealth: Payer: Self-pay

## 2013-04-21 ENCOUNTER — Encounter: Payer: Self-pay | Admitting: Internal Medicine

## 2013-04-21 ENCOUNTER — Ambulatory Visit (INDEPENDENT_AMBULATORY_CARE_PROVIDER_SITE_OTHER): Payer: Medicaid Other | Admitting: Internal Medicine

## 2013-04-21 VITALS — BP 124/88 | HR 79 | Temp 96.0°F | Ht 67.0 in | Wt 160.4 lb

## 2013-04-21 DIAGNOSIS — F17200 Nicotine dependence, unspecified, uncomplicated: Secondary | ICD-10-CM

## 2013-04-21 DIAGNOSIS — F32A Depression, unspecified: Secondary | ICD-10-CM | POA: Insufficient documentation

## 2013-04-21 DIAGNOSIS — F3289 Other specified depressive episodes: Secondary | ICD-10-CM

## 2013-04-21 DIAGNOSIS — Z0000 Encounter for general adult medical examination without abnormal findings: Secondary | ICD-10-CM

## 2013-04-21 DIAGNOSIS — F329 Major depressive disorder, single episode, unspecified: Secondary | ICD-10-CM

## 2013-04-21 DIAGNOSIS — Z720 Tobacco use: Secondary | ICD-10-CM

## 2013-04-21 DIAGNOSIS — M549 Dorsalgia, unspecified: Secondary | ICD-10-CM

## 2013-04-21 DIAGNOSIS — G8929 Other chronic pain: Secondary | ICD-10-CM

## 2013-04-21 DIAGNOSIS — I10 Essential (primary) hypertension: Secondary | ICD-10-CM

## 2013-04-21 DIAGNOSIS — J45909 Unspecified asthma, uncomplicated: Secondary | ICD-10-CM

## 2013-04-21 MED ORDER — QUETIAPINE FUMARATE 50 MG PO TABS: 50.0000 mg | ORAL_TABLET | Freq: Every day | ORAL | Status: DC

## 2013-04-21 MED ORDER — DULOXETINE HCL 60 MG PO CPEP: 60.0000 mg | ORAL_CAPSULE | Freq: Every morning | ORAL | Status: DC

## 2013-04-21 MED ORDER — OXYCODONE-ACETAMINOPHEN 5-325 MG PO TABS: 1.0000 | ORAL_TABLET | Freq: Three times a day (TID) | ORAL | Status: DC | PRN

## 2013-04-21 MED ORDER — LOSARTAN POTASSIUM 25 MG PO TABS: 25.0000 mg | ORAL_TABLET | Freq: Every day | ORAL | Status: DC | PRN

## 2013-04-21 NOTE — Assessment & Plan Note (Signed)
BP Readings from Last 3 Encounters:  04/21/13 124/88  01/15/13 128/97  11/27/12 123/75    Lab Results  Component Value Date   NA 136* 01/15/2013   K 5.0 01/15/2013   CREATININE 0.86 01/15/2013    Assessment: Blood pressure control: controlled Progress toward BP goal:  at goal Comments: none  Plan: Medications:  continue current medications (Cozaar 25 mg daily) Rx refill #90 RF x 1.  Educational resources provided: other (see comments) Self management tools provided: other (see comments) Other plans: she brought medications in today and still had bottles of Lisinopril and Diovan.  Advised her to discard and only take Cozaar.  She agrees. F/u with PCP 06/2013

## 2013-04-21 NOTE — Assessment & Plan Note (Signed)
Pap smear due 06/2013 she will f/u with PCP

## 2013-04-21 NOTE — Assessment & Plan Note (Signed)
Likely related to life stressors  Rx refill Cymbalta 60 mg qd #90 1 refill and Seroquel 50 mg #30 with 1 refill  Disc'ed pt to f/u with psychiatry for further seroquel refills

## 2013-04-21 NOTE — Patient Instructions (Addendum)
General Instructions: Please see your regular doctor for a pap smear in 06/2013  Please get an xray of your back  Take Percocet every 8 hours as needed for pain. Be careful of sedation  Try to stop smoking Read information below   Treatment Goals:  Goals (1 Years of Data) as of 04/21/13         As of Today 01/15/13 01/15/13 01/15/13 01/14/13     Blood Pressure    . Blood Pressure < 140/90  124/88 128/97 123/88 137/105 135/108     Lifestyle    . Quit smoking / using tobacco            Progress Toward Treatment Goals:  Treatment Goal 04/21/2013  Blood pressure at goal  Stop smoking unable to assess    Self Care Goals & Plans:  Self Care Goal 04/21/2013  Manage my medications take my medicines as prescribed; bring my medications to every visit; refill my medications on time  Monitor my health keep track of my blood pressure; keep track of my weight  Eat healthy foods drink diet soda or water instead of juice or soda; eat more vegetables; eat foods that are low in salt; eat baked foods instead of fried foods; eat fruit for snacks and desserts; eat smaller portions  Be physically active find an activity I enjoy  Stop smoking call [facility] (1-800-QUIT-NOW)  Meeting treatment goals maintain the current self-care plan    No flowsheet data found.   Care Management & Community Referrals:  Referral 04/21/2013  Referrals made for care management support none needed  Referrals made to community resources none       Smoking Cessation Quitting smoking is important to your health and has many advantages. However, it is not always easy to quit since nicotine is a very addictive drug. Often times, people try 3 times or more before being able to quit. This document explains the best ways for you to prepare to quit smoking. Quitting takes hard work and a lot of effort, but you can do it. ADVANTAGES OF QUITTING SMOKING  You will live longer, feel better, and live better.  Your body will feel  the impact of quitting smoking almost immediately.  Within 20 minutes, blood pressure decreases. Your pulse returns to its normal level.  After 8 hours, carbon monoxide levels in the blood return to normal. Your oxygen level increases.  After 24 hours, the chance of having a heart attack starts to decrease. Your breath, hair, and body stop smelling like smoke.  After 48 hours, damaged nerve endings begin to recover. Your sense of taste and smell improve.  After 72 hours, the body is virtually free of nicotine. Your bronchial tubes relax and breathing becomes easier.  After 2 to 12 weeks, lungs can hold more air. Exercise becomes easier and circulation improves.  The risk of having a heart attack, stroke, cancer, or lung disease is greatly reduced.  After 1 year, the risk of coronary heart disease is cut in half.  After 5 years, the risk of stroke falls to the same as a nonsmoker.  After 10 years, the risk of lung cancer is cut in half and the risk of other cancers decreases significantly.  After 15 years, the risk of coronary heart disease drops, usually to the level of a nonsmoker.  If you are pregnant, quitting smoking will improve your chances of having a healthy baby.  The people you live with, especially any children, will be healthier.  You will have extra money to spend on things other than cigarettes. QUESTIONS TO THINK ABOUT BEFORE ATTEMPTING TO QUIT You may want to talk about your answers with your caregiver.  Why do you want to quit?  If you tried to quit in the past, what helped and what did not?  What will be the most difficult situations for you after you quit? How will you plan to handle them?  Who can help you through the tough times? Your family? Friends? A caregiver?  What pleasures do you get from smoking? What ways can you still get pleasure if you quit? Here are some questions to ask your caregiver:  How can you help me to be successful at  quitting?  What medicine do you think would be best for me and how should I take it?  What should I do if I need more help?  What is smoking withdrawal like? How can I get information on withdrawal? GET READY  Set a quit date.  Change your environment by getting rid of all cigarettes, ashtrays, matches, and lighters in your home, car, or work. Do not let people smoke in your home.  Review your past attempts to quit. Think about what worked and what did not. GET SUPPORT AND ENCOURAGEMENT You have a better chance of being successful if you have help. You can get support in many ways.  Tell your family, friends, and co-workers that you are going to quit and need their support. Ask them not to smoke around you.  Get individual, group, or telephone counseling and support. Programs are available at [employer] and health centers. Call your local health department for information about programs in your area.  Spiritual beliefs and practices may help some smokers quit.  Download a "quit meter" on your computer to keep track of quit statistics, such as how long you have gone without smoking, cigarettes not smoked, and money saved.  Get a self-help book about quitting smoking and staying off of tobacco. LEARN NEW SKILLS AND BEHAVIORS  Distract yourself from urges to smoke. Talk to someone, go for a walk, or occupy your time with a task.  Change your normal routine. Take a different route to work. Drink tea instead of coffee. Eat breakfast in a different place.  Reduce your stress. Take a hot bath, exercise, or read a book.  Plan something enjoyable to do every day. Reward yourself for not smoking.  Explore interactive web-based programs that specialize in helping you quit. GET MEDICINE AND USE IT CORRECTLY Medicines can help you stop smoking and decrease the urge to smoke. Combining medicine with the above behavioral methods and support can greatly increase your chances of  successfully quitting smoking.  Nicotine replacement therapy helps deliver nicotine to your body without the negative effects and risks of smoking. Nicotine replacement therapy includes nicotine gum, lozenges, inhalers, nasal sprays, and skin patches. Some may be available over-the-counter and others require a prescription.  Antidepressant medicine helps people abstain from smoking, but how this works is unknown. This medicine is available by prescription.  Nicotinic receptor partial agonist medicine simulates the effect of nicotine in your brain. This medicine is available by prescription. Ask your caregiver for advice about which medicines to use and how to use them based on your health history. Your caregiver will tell you what side effects to look out for if you choose to be on a medicine or therapy. Carefully read the information on the package. Do not use any  other product containing nicotine while using a nicotine replacement product.  RELAPSE OR DIFFICULT SITUATIONS Most relapses occur within the first 3 months after quitting. Do not be discouraged if you start smoking again. Remember, most people try several times before finally quitting. You may have symptoms of withdrawal because your body is used to nicotine. You may crave cigarettes, be irritable, feel very hungry, cough often, get headaches, or have difficulty concentrating. The withdrawal symptoms are only temporary. They are strongest when you first quit, but they will go away within 10 14 days. To reduce the chances of relapse, try to:  Avoid drinking alcohol. Drinking lowers your chances of successfully quitting.  Reduce the amount of caffeine you consume. Once you quit smoking, the amount of caffeine in your body increases and can give you symptoms, such as a rapid heartbeat, sweating, and anxiety.  Avoid smokers because they can make you want to smoke.  Do not let weight gain distract you. Many smokers will gain weight when they  quit, usually less than 10 pounds. Eat a healthy diet and stay active. You can always lose the weight gained after you quit.  Find ways to improve your mood other than smoking. FOR MORE INFORMATION  www.smokefree.gov  Document Released: 12/19/2000 Document Revised: 06/26/2011 Document Reviewed: 04/05/2011 [facility] Patient Information 2014 [place], [place].  DASH Diet The DASH diet stands for "Dietary Approaches to Stop Hypertension." It is a healthy eating plan that has been shown to reduce high blood pressure (hypertension) in as little as 14 days, while also possibly providing other significant health benefits. These other health benefits include reducing the risk of breast cancer after menopause and reducing the risk of type 2 diabetes, heart disease, colon cancer, and stroke. Health benefits also include weight loss and slowing kidney failure in patients with chronic kidney disease.  DIET GUIDELINES  Limit salt (sodium). Your diet should contain less than 1500 mg of sodium daily.  Limit refined or processed carbohydrates. Your diet should include mostly whole grains. Desserts and added sugars should be used sparingly.  Include small amounts of heart-healthy fats. These types of fats include nuts, oils, and tub margarine. Limit saturated and trans fats. These fats have been shown to be harmful in the body. CHOOSING FOODS  The following food groups are based on a 2000 calorie diet. See your Registered Dietitian for individual calorie needs. Grains and Grain Products (6 to 8 servings daily)  Eat More Often: Whole-wheat bread, brown rice, whole-grain or wheat pasta, quinoa, popcorn without added fat or salt (air popped).  Eat Less Often: White bread, white pasta, white rice, cornbread. Vegetables (4 to 5 servings daily)  Eat More Often: Fresh, frozen, and canned vegetables. Vegetables may be raw, steamed, roasted, or grilled with a minimal amount of fat.  Eat Less Often/Avoid: Creamed or  fried vegetables. Vegetables in a cheese sauce. Fruit (4 to 5 servings daily)  Eat More Often: All fresh, canned (in natural juice), or frozen fruits. Dried fruits without added sugar. One hundred percent fruit juice ( cup [237 mL] daily).  Eat Less Often: Dried fruits with added sugar. Canned fruit in light or heavy syrup. [employer], Fish, and Poultry (2 servings or less daily. One serving is 3 to 4 oz [85-114 g]).  Eat More Often: Ninety percent or leaner ground beef, tenderloin, sirloin. Round cuts of beef, chicken breast, [place] breast. All fish. Grill, bake, or broil your meat. Nothing should be fried.  Eat Less Often/Avoid: Fatty cuts of  meat, [place], or chicken leg, thigh, or wing. Fried cuts of meat or fish. Dairy (2 to 3 servings)  Eat More Often: Low-fat or fat-free milk, low-fat plain or light yogurt, reduced-fat or part-skim cheese.  Eat Less Often/Avoid: Milk (whole, 2%).Whole milk yogurt. Full-fat cheeses. Nuts, Seeds, and Legumes (4 to 5 servings per week)  Eat More Often: All without added salt.  Eat Less Often/Avoid: Salted nuts and seeds, canned beans with added salt. Fats and Sweets (limited)  Eat More Often: Vegetable oils, tub margarines without trans fats, sugar-free gelatin. Mayonnaise and salad dressings.  Eat Less Often/Avoid: Coconut oils, palm oils, butter, stick margarine, cream, half and half, cookies, candy, pie. FOR MORE INFORMATION The Dash Diet Eating Plan: www.dashdiet.org Document Released: 12/14/2010 Document Revised: 03/19/2011 Document Reviewed: 12/14/2010 [facility] Patient Information 2014 [place].  Hypertension As your heart beats, it forces blood through your arteries. This force is your blood pressure. If the pressure is too high, it is called hypertension (HTN) or high blood pressure. HTN is dangerous because you may have it and not know it. High blood pressure may mean that your heart has to work harder to pump blood. Your arteries  may be narrow or stiff. The extra work puts you at risk for heart disease, stroke, and other problems.  Blood pressure consists of two numbers, a higher number over a lower, 110/72, for example. It is stated as "110 over 72." The ideal is below 120 for the top number (systolic) and under 80 for the bottom (diastolic). Write down your blood pressure today. You should pay close attention to your blood pressure if you have certain conditions such as:  Heart failure.  Prior heart attack.  Diabetes  Chronic kidney disease.  Prior stroke.  Multiple risk factors for heart disease. To see if you have HTN, your blood pressure should be measured while you are seated with your arm held at the level of the heart. It should be measured at least twice. A one-time elevated blood pressure reading (especially in the Emergency Department) does not mean that you need treatment. There may be conditions in which the blood pressure is different between your right and left arms. It is important to see your caregiver soon for a recheck. Most people have essential hypertension which means that there is not a specific cause. This type of high blood pressure may be lowered by changing lifestyle factors such as:  Stress.  Smoking.  Lack of exercise.  Excessive weight.  Drug/tobacco/alcohol use.  Eating less salt. Most people do not have symptoms from high blood pressure until it has caused damage to the body. Effective treatment can often prevent, delay or reduce that damage. TREATMENT  When a cause has been identified, treatment for high blood pressure is directed at the cause. There are a large number of medications to treat HTN. These fall into several categories, and your caregiver will help you select the medicines that are best for you. Medications may have side effects. You should review side effects with your caregiver. If your blood pressure stays high after you have made lifestyle changes or started on  medicines,   Your medication(s) may need to be changed.  Other problems may need to be addressed.  Be certain you understand your prescriptions, and know how and when to take your medicine.  Be sure to follow up with your caregiver within the time frame advised (usually within two weeks) to have your blood pressure rechecked and to review your  medications.  If you are taking more than one medicine to lower your blood pressure, make sure you know how and at what times they should be taken. Taking two medicines at the same time can result in blood pressure that is too low. SEEK IMMEDIATE MEDICAL CARE IF:  You develop a severe headache, blurred or changing vision, or confusion.  You have unusual weakness or numbness, or a faint feeling.  You have severe chest or abdominal pain, vomiting, or breathing problems. MAKE SURE YOU:   Understand these instructions.  Will watch your condition.  Will get help right away if you are not doing well or get worse. Document Released: 12/25/2004 Document Revised: 03/19/2011 Document Reviewed: 08/15/2007 [facility] Patient Information 2014 [place], [place].  Depression, Adult Depression refers to feeling sad, low, down in the dumps, blue, gloomy, or empty. In general, there are two kinds of depression: 1. Depression that we all experience from time to time because of upsetting life experiences, including the loss of a job or the ending of a relationship (normal sadness or normal grief). This kind of depression is considered normal, is short lived, and resolves within a few days to 2 weeks. (Depression experienced after the loss of a loved one is called bereavement. Bereavement often lasts longer than 2 weeks but normally gets better with time.) 2. Clinical depression, which lasts longer than normal sadness or normal grief or interferes with your ability to function at home, at work, and in school. It also interferes with your personal relationships. It  affects almost every aspect of your life. Clinical depression is an illness. Symptoms of depression also can be caused by conditions other than normal sadness and grief or clinical depression. Examples of these conditions are listed as follows:  Physical illness Some physical illnesses, including underactive thyroid gland (hypothyroidism), severe anemia, specific types of cancer, diabetes, uncontrolled seizures, heart and lung problems, strokes, and chronic pain are commonly associated with symptoms of depression.  Side effects of some prescription medicine In some people, certain types of prescription medicine can cause symptoms of depression.  Substance abuse Abuse of alcohol and illicit drugs can cause symptoms of depression. SYMPTOMS Symptoms of normal sadness and normal grief include the following:  Feeling sad or crying for short periods of time.  Not caring about anything (apathy).  Difficulty sleeping or sleeping too much.  No longer able to enjoy the things you used to enjoy.  Desire to be by oneself all the time (social isolation).  Lack of energy or motivation.  Difficulty concentrating or remembering.  Change in appetite or weight.  Restlessness or agitation. Symptoms of clinical depression include the same symptoms of normal sadness or normal grief and also the following symptoms:  Feeling sad or crying all the time.  Feelings of guilt or worthlessness.  Feelings of hopelessness or helplessness.  Thoughts of suicide or the desire to harm yourself (suicidal ideation).  Loss of touch with reality (psychotic symptoms). Seeing or hearing things that are not real (hallucinations) or having false beliefs about your life or the people around you (delusions and paranoia). DIAGNOSIS  The diagnosis of clinical depression usually is based on the severity and duration of the symptoms. Your caregiver also will ask you questions about your medical history and substance use to  find out if physical illness, use of prescription medicine, or substance abuse is causing your depression. Your caregiver also may order blood tests. TREATMENT  Typically, normal sadness and normal grief do not require treatment.  However, sometimes antidepressant medicine is prescribed for bereavement to ease the depressive symptoms until they resolve. The treatment for clinical depression depends on the severity of your symptoms but typically includes antidepressant medicine, counseling with a mental health professional, or a combination of both. Your caregiver will help to determine what treatment is best for you. Depression caused by physical illness usually goes away with appropriate medical treatment of the illness. If prescription medicine is causing depression, talk with your caregiver about stopping the medicine, decreasing the dose, or substituting another medicine. Depression caused by abuse of alcohol or illicit drugs abuse goes away with abstinence from these substances. Some adults need professional help in order to stop drinking or using drugs. SEEK IMMEDIATE CARE IF:  You have thoughts about hurting yourself or others.  You lose touch with reality (have psychotic symptoms).  You are taking medicine for depression and have a serious side effect. FOR MORE INFORMATION National Alliance on Mental Illness: www.nami.[employer] of Mental [employer]: http://www.maynard.net/ Document Released: 12/23/1999 Document Revised: 06/26/2011 Document Reviewed: 03/26/2011 [facility] Patient Information 2014 [place], [place].  Asthma, Adult Asthma is a recurring condition in which the airways tighten and narrow. Asthma can make it difficult to breathe. It can cause coughing, wheezing, and shortness of breath. Asthma episodes (also called asthma attacks) range from minor to life-threatening. Asthma cannot be cured, but medicines and lifestyle changes can help control it. CAUSES Asthma is believed to  be caused by inherited (genetic) and environmental factors, but its exact cause is unknown. Asthma may be triggered by allergens, lung infections, or irritants in the air. Asthma triggers are different for each person. Common triggers include:   Animal dander.  Dust mites.  Cockroaches.  Pollen from trees or grass.  Mold.  Smoke.  Air pollutants such as dust, household cleaners, hair sprays, aerosol sprays, paint fumes, strong chemicals, or strong odors.  Cold air, weather changes, and winds (which increase molds and pollens in the air).  Strong emotional expressions such as crying or laughing hard.  Stress.  Certain medicines (such as aspirin) or types of drugs (such as beta-blockers).  Sulfites in foods and drinks. Foods and drinks that may contain sulfites include dried fruit, potato chips, and sparkling grape juice.  Infections or inflammatory conditions such as the flu, a cold, or an inflammation of the nasal membranes (rhinitis).  Gastroesophageal reflux disease (GERD).  Exercise or strenuous activity. SYMPTOMS Symptoms may occur immediately after asthma is triggered or many hours later. Symptoms include:  Wheezing.  Excessive nighttime or early morning coughing.  Frequent or severe coughing with a common cold.  Chest tightness.  Shortness of breath. DIAGNOSIS  The diagnosis of asthma is made by a review of your medical history and a physical exam. Tests may also be performed. These may include:  Lung function studies. These tests show how much air you breath in and out.  Allergy tests.  Imaging tests such as X-rays. TREATMENT  Asthma cannot be cured, but it can usually be controlled. Treatment involves identifying and avoiding your asthma triggers. It also involves medicines. There are 2 classes of medicine used for asthma treatment:   Controller medicines. These prevent asthma symptoms from occurring. They are usually taken every day.  Reliever or  rescue medicines. These quickly relieve asthma symptoms. They are used as needed and provide short-term relief. Your health care provider will help you create an asthma action plan. An asthma action plan is a written plan for managing and treating your  asthma attacks. It includes a list of your asthma triggers and how they may be avoided. It also includes information on when medicines should be taken and when their dosage should be changed. An action plan may also involve the use of a device called a peak flow meter. A peak flow meter measures how well the lungs are working. It helps you monitor your condition. HOME CARE INSTRUCTIONS   Take medicine as directed by your health care provider. Speak with your health care provider if you have questions about how or when to take the medicines.  Use a peak flow meter as directed by your health care provider. Record and keep track of readings.  Understand and use the action plan to help minimize or stop an asthma attack without needing to seek medical care.  Control your home environment in the following ways to help prevent asthma attacks:  Do not smoke. Avoid being exposed to secondhand smoke.  Change your heating and air conditioning filter regularly.  Limit your use of fireplaces and wood stoves.  Get rid of pests (such as roaches and mice) and their droppings.  Throw away plants if you see mold on them.  Clean your floors and dust regularly. Use unscented cleaning products.  Try to have someone else vacuum for you regularly. Stay out of rooms while they are being vacuumed and for a short while afterward. If you vacuum, use a dust mask from a hardware store, a double-layered or microfilter vacuum cleaner bag, or a vacuum cleaner with a HEPA filter.  Replace carpet with wood, tile, or vinyl flooring. Carpet can trap dander and dust.  Use allergy-proof pillows, mattress covers, and box spring covers.  Wash bed sheets and blankets every week  in hot water and dry them in a dryer.  Use blankets that are made of polyester or cotton.  Clean bathrooms and kitchens with bleach. If possible, have someone repaint the walls in these rooms with mold-resistant paint. Keep out of the rooms that are being cleaned and painted.  Wash hands frequently. SEEK MEDICAL CARE IF:   You have wheezing, shortness of breath, or a cough even if taking medicine to prevent attacks.  The colored mucus you cough up (sputum) is thicker than usual.  Your sputum changes from clear or white to yellow, green, gray, or bloody.  You have any problems that may be related to the medicines you are taking (such as a rash, itching, swelling, or trouble breathing).  You are using a reliever medicine more than 2 3 times per week.  Your peak flow is still at 50 79% of you personal best after following your action plan for 1 hour. SEEK IMMEDIATE MEDICAL CARE IF:   You seem to be getting worse and are unresponsive to treatment during an asthma attack.  You are short of breath even at rest.  You get short of breath when doing very little physical activity.  You have difficulty eating, drinking, or talking due to asthma symptoms.  You develop chest pain.  You develop a fast heartbeat.  You have a bluish color to your lips or fingernails.  You are lightheaded, dizzy, or faint.  Your peak flow is less than 50% of your personal best.  You have a fever or persistent symptoms for more than 2 3 days.  You have a fever and symptoms suddenly get worse. MAKE SURE YOU:   Understand these instructions.  Will watch your condition.  Will get help right away if  you are not doing well or get worse. Document Released: 12/25/2004 Document Revised: 08/27/2012 Document Reviewed: 07/24/2012 [facility] Patient Information 2014 [place], [place].  Acetaminophen; Oxycodone tablets What is this medicine? ACETAMINOPHEN; OXYCODONE (a set a MEE noe fen; ox i KOE done) is a pain  reliever. It is used to treat mild to moderate pain. This medicine may be used for other purposes; ask your health care provider or pharmacist if you have questions. COMMON BRAND NAME(S): Endocet, Magnacet, Narvox, Percocet, Perloxx, Primalev, Primlev, Roxicet, Xolox What should I tell my health care provider before I take this medicine? They need to know if you have any of these conditions: -brain tumor -Crohn's disease, inflammatory bowel disease, or ulcerative colitis -drug abuse or addiction -head injury -heart or circulation problems -if you often drink alcohol -kidney disease or problems going to the bathroom -liver disease -lung disease, asthma, or breathing problems -an unusual or allergic reaction to acetaminophen, oxycodone, other opioid analgesics, other medicines, foods, dyes, or preservatives -pregnant or trying to get pregnant -breast-feeding How should I use this medicine? Take this medicine by mouth with a full glass of water. Follow the directions on the prescription label. Take your medicine at regular intervals. Do not take your medicine more often than directed. Talk to your pediatrician regarding the use of this medicine in children. Special care may be needed. Patients over 34 years old may have a stronger reaction and need a smaller dose. Overdosage: If you think you have taken too much of this medicine contact a poison control center or emergency room at once. NOTE: This medicine is only for you. Do not share this medicine with others. What if I miss a dose? If you miss a dose, take it as soon as you can. If it is almost time for your next dose, take only that dose. Do not take double or extra doses. What may interact with this medicine? -alcohol -antihistamines -barbiturates like amobarbital, butalbital, butabarbital, methohexital, pentobarbital, phenobarbital, thiopental, and secobarbital -benztropine -drugs for bladder problems like solifenacin, trospium,  oxybutynin, tolterodine, hyoscyamine, and methscopolamine -drugs for breathing problems like ipratropium and tiotropium -drugs for certain stomach or intestine problems like propantheline, homatropine methylbromide, glycopyrrolate, atropine, belladonna, and dicyclomine -general anesthetics like etomidate, ketamine, nitrous oxide, propofol, desflurane, enflurane, halothane, isoflurane, and sevoflurane -medicines for depression, anxiety, or psychotic disturbances -medicines for sleep -muscle relaxants -naltrexone -narcotic medicines (opiates) for pain -phenothiazines like perphenazine, thioridazine, chlorpromazine, mesoridazine, fluphenazine, prochlorperazine, promazine, and trifluoperazine -scopolamine -tramadol -trihexyphenidyl This list may not describe all possible interactions. Give your health care provider a list of all the medicines, herbs, non-prescription drugs, or dietary supplements you use. Also tell them if you smoke, drink alcohol, or use illegal drugs. Some items may interact with your medicine. What should I watch for while using this medicine? Tell your doctor or health care professional if your pain does not go away, if it gets worse, or if you have new or a different type of pain. You may develop tolerance to the medicine. Tolerance means that you will need a higher dose of the medication for pain relief. Tolerance is normal and is expected if you take this medicine for a long time. Do not suddenly stop taking your medicine because you may develop a severe reaction. Your body becomes used to the medicine. This does NOT mean you are addicted. Addiction is a behavior related to getting and using a drug for a non-medical reason. If you have pain, you have a medical reason to take  pain medicine. Your doctor will tell you how much medicine to take. If your doctor wants you to stop the medicine, the dose will be slowly lowered over time to avoid any side effects. You may get drowsy or  dizzy. Do not drive, use machinery, or do anything that needs mental alertness until you know how this medicine affects you. Do not stand or sit up quickly, especially if you are an older patient. This reduces the risk of dizzy or fainting spells. Alcohol may interfere with the effect of this medicine. Avoid alcoholic drinks. There are different types of narcotic medicines (opiates) for pain. If you take more than one type at the same time, you may have more side effects. Give your health care provider a list of all medicines you use. Your doctor will tell you how much medicine to take. Do not take more medicine than directed. Call emergency for help if you have problems breathing. The medicine will cause constipation. Try to have a bowel movement at least every 2 to 3 days. If you do not have a bowel movement for 3 days, call your doctor or health care professional. Do not take Tylenol (acetaminophen) or medicines that have acetaminophen with this medicine. Too much acetaminophen can be very dangerous. Many nonprescription medicines contain acetaminophen. Always read the labels carefully to avoid taking more acetaminophen. What side effects may I notice from receiving this medicine? Side effects that you should report to your doctor or health care professional as soon as possible: -allergic reactions like skin rash, itching or hives, swelling of the face, lips, or tongue -breathing difficulties, wheezing -confusion -light headedness or fainting spells -severe stomach pain -unusually weak or tired -yellowing of the skin or the whites of the eyes  Side effects that usually do not require medical attention (report to your doctor or health care professional if they continue or are bothersome): -dizziness -drowsiness -nausea -vomiting This list may not describe all possible side effects. Call your doctor for medical advice about side effects. You may report side effects to FDA at 1-800-FDA-1088. Where  should I keep my medicine? Keep out of the reach of children. This medicine can be abused. Keep your medicine in a safe place to protect it from theft. Do not share this medicine with anyone. Selling or giving away this medicine is dangerous and against the law. Store at room temperature between 20 and 25 degrees C (68 and 77 degrees F). Keep container tightly closed. Protect from light. This medicine may cause accidental overdose and death if it is taken by other adults, children, or pets. Flush any unused medicine down the toilet to reduce the chance of harm. Do not use the medicine after the expiration date. NOTE: This sheet is a summary. It may not cover all possible information. If you have questions about this medicine, talk to your doctor, pharmacist, or health care provider.  2014, [employer]. (2012-08-18 13:17:35)

## 2013-04-21 NOTE — Progress Notes (Signed)
Subjective:    Patient ID: Rebecca Holmes, female    DOB: 09/11/1975, 38 y.o.   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   Cardiology-Dr. Eden EmmsNishan Pulmonologist-Dr. Sherene SiresWert   She presents for follow up today for:  1) She needs medications refills Cymbalta, Cozaar, Seroquel. Disc'ed she must f/u with psychiatry for future refills of Seroquel which she states she uses for "sleep"   2) History of asthma.  Last hospitalization was 01/2013 when she had the flu 3) She has been depressed since 2008 due to carrying her 576 month old and tripping with the baby in hand and the baby died.  She is also depressed due to losing another child in 1997, finances, bills 4) Chronic back pain-back pain is constant, daily w/o radiation in the lower back.  Pain can be up to 10/10.  Back pain is worsening w/in the last 3 weeks.  She remembers a fall in 2008. Tylenol does not help.  She has tried medicated back pads (OTC) which helped while in place.  She tried Flexeril which makes her fall asleep.  Sometimes when she walks her back is more painful and catches and then later she can resume walking.  She states if hurts to bend, walk, stand for long periods of time.  She denies any sensation changes in the perineal region.  Review of previous imaging 2011 with early disc space narrowing in the lower lumbar spine.     SH: she does not work, smoking 1/2 ppd not ready to quit and due to stress; 5 kids ages 89, 10911,13, 3216, and 7616. She drinks occasionally                                                         Review of Systems  Constitutional: Positive for chills. Negative for fever and appetite change.  Respiratory: Negative for shortness of breath.   Cardiovascular: Positive for leg swelling. Negative for chest pain.  Gastrointestinal: Negative for constipation.  Genitourinary: Negative for dysuria and difficulty urinating.  Musculoskeletal: Positive for back pain.  Neurological: Negative for dizziness and light-headedness.  Psychiatric/Behavioral: Positive for dysphoric mood. Negative for suicidal ideas.       Objective:   Physical Exam  Nursing note and vitals reviewed. Constitutional: She is oriented to person, place, and time. Vital signs are normal. She appears well-developed and well-nourished. She is cooperative. No distress.  HENT:  Head: Normocephalic and atraumatic.  Mouth/Throat: Oropharynx is clear and moist and mucous membranes are normal. Abnormal dentition. No oropharyngeal exudate.  Eyes: Conjunctivae are normal. Pupils are equal, round, and reactive to light. Right eye exhibits no discharge. Left eye exhibits no discharge. No scleral icterus.  Cardiovascular: Normal rate, regular rhythm, S1 normal, S2 normal and normal heart sounds.   No murmur heard. No lower ext edema   Pulmonary/Chest: Effort normal and breath sounds normal. No respiratory distress. She has  no wheezes.  Abdominal: Soft. Bowel sounds are normal. There is no tenderness.  Musculoskeletal:       Lumbar back: She exhibits decreased range of motion and tenderness.  Mod ttp lumbar spine  Neurological: She is alert and oriented to person, place, and time.  Slowed gait   Skin: Skin is warm, dry and intact. No rash noted. She is not diaphoretic.  Psychiatric: She has a normal mood and affect. Her speech is normal and behavior is normal. Judgment and thought content normal. Cognition and memory are normal.          Assessment & Plan:  F/u 06/2013  with PCP for pap smear

## 2013-04-21 NOTE — Assessment & Plan Note (Signed)
She has mod ttp on exam and reviewed Xray lumbar spine 2011 with early disc space narrowing in lower lumbar spine.  Back pain is worsening x 3 weeks w/o alarm signs Likely MSK.  Will re-image with Xray lumbar spine, try Percocet 5-325 mg tid prn. Ed side effects Percocet  Consider PT in the future if insurance will cover  RTC prn

## 2013-04-21 NOTE — Assessment & Plan Note (Signed)
H/o intubation and status asthmaticus Encouraged smoking cessation Continue Albuterol prn, Advair  She follows with Dr. Sherene SiresWert (Pulmonology)

## 2013-04-21 NOTE — Assessment & Plan Note (Signed)
  Assessment: Progress toward smoking cessation:  unable to assess (still smoknig) Barriers to progress toward smoking cessation:  Still smoking 1/2 ppd  Comments: she is not ready to quit   Plan: Instruction/counseling given:  I counseled patient on the dangers of tobacco use, advised patient to stop smoking, and reviewed strategies to maximize success. Educational resources provided:  other (see comments) Self management tools provided:  other (see comments) Medications to assist with smoking cessation:  None Patient agreed to the following self-care plans for smoking cessation: call [facility] (1-800-QUIT-NOW)  Other plans: encouraged cessation esp with history of asthma

## 2013-04-23 NOTE — Progress Notes (Signed)
Case discussed with Dr. McLean at the time of the visit.  We reviewed the resident's history and exam and pertinent patient test results.  I agree with the assessment, diagnosis, and plan of care documented in the resident's note.     

## 2013-05-08 ENCOUNTER — Encounter: Payer: Self-pay | Admitting: Internal Medicine

## 2013-05-08 ENCOUNTER — Ambulatory Visit (INDEPENDENT_AMBULATORY_CARE_PROVIDER_SITE_OTHER): Payer: Medicaid Other | Admitting: Internal Medicine

## 2013-05-08 VITALS — BP 134/93 | HR 81 | Temp 97.5°F | Ht 67.0 in | Wt 158.4 lb

## 2013-05-08 DIAGNOSIS — Z3042 Encounter for surveillance of injectable contraceptive: Secondary | ICD-10-CM

## 2013-05-08 DIAGNOSIS — G8929 Other chronic pain: Secondary | ICD-10-CM

## 2013-05-08 DIAGNOSIS — Z0000 Encounter for general adult medical examination without abnormal findings: Secondary | ICD-10-CM

## 2013-05-08 DIAGNOSIS — J45909 Unspecified asthma, uncomplicated: Secondary | ICD-10-CM

## 2013-05-08 DIAGNOSIS — I10 Essential (primary) hypertension: Secondary | ICD-10-CM

## 2013-05-08 DIAGNOSIS — M549 Dorsalgia, unspecified: Secondary | ICD-10-CM

## 2013-05-08 LAB — BASIC METABOLIC PANEL
BUN: 9 mg/dL (ref 6–23)
CHLORIDE: 108 meq/L (ref 96–112)
CO2: 23 meq/L (ref 19–32)
Calcium: 9.2 mg/dL (ref 8.4–10.5)
Creat: 0.94 mg/dL (ref 0.50–1.10)
Glucose, Bld: 74 mg/dL (ref 70–99)
Potassium: 3.9 mEq/L (ref 3.5–5.3)
Sodium: 138 mEq/L (ref 135–145)

## 2013-05-08 LAB — PREGNANCY, URINE: Preg Test, Ur: NEGATIVE

## 2013-05-08 MED ORDER — MEDROXYPROGESTERONE ACETATE 150 MG/ML IM SUSP
150.0000 mg | Freq: Once | INTRAMUSCULAR | Status: AC
  Administered 2013-05-08: 150 mg via INTRAMUSCULAR

## 2013-05-08 NOTE — Patient Instructions (Signed)
We will be ordering a MRI of your back today.  We will follow up with the results and let you know way we will do next about your back pain.

## 2013-05-08 NOTE — Assessment & Plan Note (Signed)
Pt says she will prefer to come here next month for pap smear. She does not want to have it done today.

## 2013-05-08 NOTE — Assessment & Plan Note (Signed)
BP Readings from Last 3 Encounters:  05/08/13 134/93  04/21/13 124/88  01/15/13 128/97    Lab Results  Component Value Date   NA 136* 01/15/2013   K 5.0 01/15/2013   CREATININE 0.86 01/15/2013    Assessment: Blood pressure control:  Well controlled Progress toward BP goal:   At goal  Plan: Medications:  continue current medications Other plans: BMET today

## 2013-05-08 NOTE — Addendum Note (Signed)
Addended by: Filomena JunglingHAGUE, Love Milbourne C on: 05/08/2013 03:22 PM   Modules accepted: Orders

## 2013-05-08 NOTE — Addendum Note (Signed)
Addended by: Angelina OkHERBIN, Eurika Sandy F on: 05/08/2013 03:47 PM   Modules accepted: Orders

## 2013-05-08 NOTE — Assessment & Plan Note (Signed)
Symptoms appear to be well controled on home inhaler, and pt uses her nebulizers at home if she has to, with good control of her symptoms. Still smokes cigarrettes- half a pack a day, not ready to quit, says she will rather do that than do drugs. Says smoking relieves her stress.

## 2013-05-08 NOTE — Progress Notes (Signed)
Case discussed with Dr. Emokpae at the time of the visit.  We reviewed the resident's history and exam and pertinent patient test results.  I agree with the assessment, diagnosis, and plan of care documented in the resident's note. 

## 2013-05-08 NOTE — Assessment & Plan Note (Signed)
Assessment - Symptoms have been present for years- LAst imaging 2011- Showed some narrowing on the joint space.  Plan- MRI without Contrast today (Confirmed with radiology the imaging Modality- W or Wo contrast.)

## 2013-05-08 NOTE — Progress Notes (Signed)
Patient ID: Rebecca Holmes, female   DOB: 05/25/1975, 38 y.o.   MRN: [id number]   Subjective:   Patient ID: Rebecca Holmes female   DOB: 03/06/1975 37 y.o.   MRN: [id number]  HPI: Rebecca Holmes is a 38 y.o. with pmh of Asthma, with hx of status Asthmaticus, depression and and HTN. Presented today with complaints of back pain that has been present since 1996 when she was in high school. Pain significantly got worse after she fell in 2008. Has shooting pains bilaterally, occasional numbness and weakness. No Fecal or urinary problems. No saddle anaethesia. No fever. Pt has tried tylenol, NSAIDs with no relief. Percocets help with the pain, but she says it make her feel like she is high on drugs, though sh e takes only one tablet as needed for pain.   Past Medical History  Diagnosis Date  . Heart murmur   . Left ventricular dysfunction     Decreased; Low E 20-25% in the setting of severe illness requiring ICU care 06/2008; Cardiolyte 06/13/2009 normal perfusion w/o significant ischemia or scar; LVEF 38%  . Respiratory failure     Ventilator Dependent; 2/2 status asthmatics 6/11-18/2010 admit [facility]  . HTN (hypertension)     Try off ACE 06/13/2009 due to pseudoasthma  . Vision disorder   . Anemia, iron deficiency   . Asthma     vs Pseudoasthma from ACE; Trial off ACE > PFT's normal 09/09/09 x DLCO 59 > corrects to 73%  . Depression   . Insomnia    Current Outpatient Prescriptions  Medication Sig Dispense Refill  . acetaminophen (TYLENOL) 500 MG tablet Take 400-800 mg by mouth as needed for moderate pain or headache.       . albuterol (PROVENTIL HFA;VENTOLIN HFA) 108 (90 BASE) MCG/ACT inhaler Inhale 2 puffs into the lungs every 6 (six) hours as needed. For shortness of breath  1 Inhaler  7  . albuterol (PROVENTIL) (2.5 MG/3ML) 0.083% nebulizer solution Take 3 mLs (2.5 mg total) by nebulization every 6 (six) hours as needed for wheezing or shortness of breath.  120 vial  12  . cyclobenzaprine  (FLEXERIL) 10 MG tablet Take 1 tablet (10 mg total) by mouth 2 (two) times daily as needed for muscle spasms.  30 tablet  3  . DULoxetine (CYMBALTA) 60 MG capsule Take 1 capsule (60 mg total) by mouth every morning.  90 capsule  1  . fluticasone (FLONASE) 50 MCG/ACT nasal spray Place 2 sprays into both nostrils daily.  16 g  2  . Fluticasone-Salmeterol (ADVAIR) 250-50 MCG/DOSE AEPB Inhale 1 puff into the lungs 2 (two) times daily.  60 each  7  . guaiFENesin (MUCINEX) 600 MG 12 hr tablet Take 1 tablet (600 mg total) by mouth 2 (two) times daily.  12 tablet  0  . losartan (COZAAR) 25 MG tablet Take 1 tablet (25 mg total) by mouth daily as needed (chest pain).  90 tablet  1  . oxyCODONE-acetaminophen (PERCOCET/ROXICET) 5-325 MG per tablet Take 1 tablet by mouth every 8 (eight) hours as needed for severe pain.  90 tablet  0  . QUEtiapine (SEROQUEL) 50 MG tablet Take 1 tablet (50 mg total) by mouth at bedtime.  30 tablet  1   No current facility-administered medications for this visit.   Family History  Problem Relation Age of Onset  . Diabetes Mother [id number]  . Cancer Mother     unknown   . Diabetes type II Brother 20  .  Asthma Maternal Grandfather   . Asthma Daughter    History   Social History  . Marital Status: Single    Spouse Name: N/A    Number of Children: 5  . Years of Education: 11th grade   Occupational History  . unemployed     2001   Social History Main Topics  . Smoking status: Current Every Day Smoker -- 0.50 packs/day for 19 years    Types: Cigarettes  . Smokeless tobacco: Never Used  . Alcohol Use: Yes     Comment: occasionally  . Drug Use: No  . Sexual Activity: Yes    Partners: Male   Other Topics Concern  . None   Social History Narrative   Patient is currently not working, Lives with [place] boy friend in [place]. 5 kids, 3 boys, 2 girls.    Review of Systems: CONSTITUTIONAL- No Fever, weightloss, night sweat,or change in  appetite. SKIN- No Rash, colour changes,  itching. HEAD- No Headache, dizziness. EYES- No Vision loss, pain, redness, double or blurred vision. RESPIRATORY- No  Cough, or SOB. CARDIAC- No Palpitations, or chest pain. GI- No vomiting, diarrhoea, constipation, or abd pain.  URINARY- No Frequency,or dysuria NEUROLOGIC- Numbness- occasionally in her legs.   Objective:  Physical Exam: Filed Vitals:   05/08/13 1339  BP: 134/93  Pulse: 81  Temp: 97.5 F (36.4 C)  TempSrc: Oral  Height: 5\' 7"  (1.702 m)  Weight: 158 lb 6.4 oz (71.85 kg)  SpO2: 100%   GENERAL- alert, co-operative, appears as stated age, not in any distress, moving around trying to find a comfortable position says she has been like this since 2008. HEENT- Atraumatic, normocephalic, PERRL, EOMI, oral mucosa appears moist, thyroid does not appear enlarged. CARDIAC- RRR, no murmurs, rubs or gallops. RESP- Moving equal volumes of air, and clear to auscultation bilaterally- No wheezes.. ABDOMEN- Soft, nontender, no palpable masses or organomegaly, bowel sounds present. BACK- Normal curvature of the spine, complaints of some tenderness lower vertebrae, no CVA tenderness. NEURO- No Obvious Cr N abnormality, strenght 5/5 in both upper and lower extremity, sensation intact globally. EXTREMITIES- pulse 2+, symmetric, no pedal edema. SKIN- Warm, dry, No rash or lesion. PSYCH- Normal mood and affect, appropriate thought content and speech.  Assessment & Plan:  The patient's case and plan of care was discussed with attending physician, Dr. Ulyess MortStewart Rogers.  Please see problem based Chatting for assessment and plan.

## 2013-05-18 ENCOUNTER — Encounter: Payer: Self-pay | Admitting: Cardiovascular Disease

## 2013-05-18 ENCOUNTER — Ambulatory Visit (INDEPENDENT_AMBULATORY_CARE_PROVIDER_SITE_OTHER): Payer: Medicaid Other | Admitting: Cardiovascular Disease

## 2013-05-18 VITALS — BP 110/90 | HR 82 | Ht 67.0 in | Wt 165.8 lb

## 2013-05-18 DIAGNOSIS — I509 Heart failure, unspecified: Secondary | ICD-10-CM

## 2013-05-18 DIAGNOSIS — M549 Dorsalgia, unspecified: Secondary | ICD-10-CM

## 2013-05-18 DIAGNOSIS — I10 Essential (primary) hypertension: Secondary | ICD-10-CM

## 2013-05-18 DIAGNOSIS — J45909 Unspecified asthma, uncomplicated: Secondary | ICD-10-CM

## 2013-05-18 MED ORDER — LOSARTAN POTASSIUM 25 MG PO TABS: 25.0000 mg | ORAL_TABLET | Freq: Every day | ORAL | Status: DC | PRN

## 2013-05-18 NOTE — Assessment & Plan Note (Signed)
Getting MRI of lumbar spine Pain meds per IM service

## 2013-05-18 NOTE — Patient Instructions (Signed)
Your physician wants you to follow-up in: YEAR WITH DR NISHAN  You will receive a reminder letter in the mail two months in advance. If you don't receive a letter, please call our office to schedule the follow-up appointment.  Your physician recommends that you continue on your current medications as directed. Please refer to the Current Medication list given to you today. 

## 2013-05-18 NOTE — Progress Notes (Signed)
Patient ID: Rebecca Holmes, female   DOB: 08/12/1975, 38 y.o.   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  Last seen in 2011  hospitalized with respitory failure and severe chronic asthmatic bronchitis. She was found to have an EF 20-25% at the time of this serious illness. F/U echo done 05/19/09 was reviewed and showed marked improvement with EF 40-45%.  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 It can hurt to the touch. She has not had a lot of wheezing. She denies palpitations or syncope. Echo showed no evidence of cor pulmonale. Seh has been compliant with her meds. Pseudoasthma with ACE tolerating ARB    Last echo reviewed EF normal   01/24/13    Study Conclusions  - Left ventricle: Abnormal Septal motion The cavity size was normal. Systolic function was normal. The estimated ejection fraction was in the range of 55% to 60%. Wall motion was normal; there were no regional wall motion abnormalities. - Atrial septum: No defect or patent foramen ovale was identified.  She has asthma and is still smoking  Counseled for less than 10 minutes but no motivation to quit.  Has 5 kids ages 438-16 with no help and smoking is her only stress relief Chronic Lower back pain takes occasional percocet  ROS: Denies fever, malais, weight loss, blurry vision, decreased visual acuity, cough, sputum, SOB, hemoptysis, pleuritic pain, palpitaitons, heartburn, abdominal pain, melena, lower extremity edema, claudication, or rash.  All other systems reviewed and negative   General: Affect appropriate Healthy:  appears stated age HEENT: normal Neck supple with no adenopathy JVP normal no bruits no thyromegaly Lungs Diffuse rhonchi and wheezing and good diaphragmatic motion Heart:  S1/S2 no murmur,rub, gallop or click PMI normal Abdomen: benighn, BS positve, no tenderness, no AAA no bruit.  No HSM or HJR Distal pulses intact with no bruits No edema Neuro  non-focal Skin warm and dry No muscular weakness  Medications Current Outpatient Prescriptions  Medication Sig Dispense Refill  . acetaminophen (TYLENOL) 500 MG tablet Take 400-800 mg by mouth as needed for moderate pain or headache.       . albuterol (PROVENTIL HFA;VENTOLIN HFA) 108 (90 BASE) MCG/ACT inhaler Inhale 2 puffs into the lungs every 6 (six) hours as needed. For shortness of breath  1 Inhaler  7  . albuterol (PROVENTIL) (2.5 MG/3ML) 0.083% nebulizer solution Take 3 mLs (2.5 mg total) by nebulization every 6 (six) hours as needed for wheezing or shortness of breath.  120 vial  12  . cyclobenzaprine (FLEXERIL) 10 MG tablet Take 1 tablet (10 mg total) by mouth 2 (two) times daily as needed for muscle spasms.  30 tablet  3  . DULoxetine (CYMBALTA) 60 MG capsule Take 1 capsule (60 mg total) by mouth every morning.  90 capsule  1  . fluticasone (FLONASE) 50 MCG/ACT nasal spray Place 2 sprays into both nostrils daily.  16 g  2  . Fluticasone-Salmeterol (ADVAIR) 250-50 MCG/DOSE AEPB Inhale 1 puff into the lungs 2 (two) times daily.  60 each  7  . guaiFENesin (MUCINEX) 600 MG 12 hr tablet Take 1 tablet (600 mg total) by mouth 2 (two) times daily.  12 tablet  0  . losartan (COZAAR) 25 MG tablet Take 1 tablet (25 mg total) by mouth daily as needed (chest pain).  90 tablet  1  . oxyCODONE-acetaminophen (PERCOCET/ROXICET) 5-325 MG per tablet Take 1 tablet by mouth every  8 (eight) hours as needed for severe pain.  90 tablet  0  . QUEtiapine (SEROQUEL) 50 MG tablet Take 1 tablet (50 mg total) by mouth at bedtime.  30 tablet  1   No current facility-administered medications for this visit.    Allergies Peanut-containing drug products  Family History: Family History  Problem Relation Age of Onset  . Diabetes Mother 1850  . Cancer Mother     unknown   . Diabetes type II Brother 20  . Asthma Maternal Grandfather   . Asthma Daughter     Social History: History   Social History  . Marital  Status: Single    Spouse Name: N/A    Number of Children: 5  . Years of Education: 11th grade   Occupational History  . unemployed     2001   Social History Main Topics  . Smoking status: Current Every Day Smoker -- 0.50 packs/day for 19 years    Types: Cigarettes  . Smokeless tobacco: Never Used  . Alcohol Use: Yes     Comment: occasionally  . Drug Use: No  . Sexual Activity: Yes    Partners: Male   Other Topics Concern  . Not on file   Social History Narrative   Patient is currently not working, Lives [place] friend [place]. 5 kids, 3 boys, 2 girls.     Electrocardiogram:  ST rate 110  RSR; no acute changes   Assessment and Plan

## 2013-05-18 NOTE — Assessment & Plan Note (Signed)
Well controlled.  Continue current medications and low sodium Dash type diet.    

## 2013-05-18 NOTE — Assessment & Plan Note (Signed)
Very severe counseled on smoking trigger but no motivation to quit.  Continue symbicort maintenance and beta agonsit for acute flares She is high risk for recurrent asthmatic events that will require ER visit

## 2013-05-18 NOTE — Assessment & Plan Note (Signed)
Resolved presumed non ischemic DCM in setting of status asthmaticus and intubation.  Echo 1/15 EF normal with no pulmonary HTN

## 2013-05-25 ENCOUNTER — Telehealth: Payer: Self-pay | Admitting: *Deleted

## 2013-05-25 NOTE — Telephone Encounter (Signed)
Received PA request from pt's pharmacy.  Request approved x 1 yr, pharmacy aware. Requires trial and failure of an ACE-pt was on lisinopril in the past. Phone call complete.Idelia Caudell C Goldston5/18/20154:04 PM

## 2013-06-26 ENCOUNTER — Telehealth: Payer: Self-pay | Admitting: Internal Medicine

## 2013-06-26 DIAGNOSIS — I10 Essential (primary) hypertension: Secondary | ICD-10-CM

## 2013-06-26 MED ORDER — LOSARTAN POTASSIUM 25 MG PO TABS: 25.0000 mg | ORAL_TABLET | Freq: Every day | ORAL | Status: DC

## 2013-06-26 NOTE — Telephone Encounter (Signed)
[facility] [place] with question about Losartan 25mg  daily " as needed for chest pain". This was renewed on 04/21/13 by Dr. Desma Maximracy McLean but the PRN order appears to be a mistake as the note from 04/21/13 states that the patient is supposed to take  Losartan 25mg  daily for her HTN.   The pharmacy will updated this medication orders.   I updated the medication list to reflect daily use v PRN for chest pain.

## 2013-07-03 ENCOUNTER — Other Ambulatory Visit: Payer: Self-pay | Admitting: Internal Medicine

## 2013-07-03 ENCOUNTER — Other Ambulatory Visit: Payer: Self-pay | Admitting: *Deleted

## 2013-07-03 DIAGNOSIS — M549 Dorsalgia, unspecified: Secondary | ICD-10-CM

## 2013-07-03 NOTE — Telephone Encounter (Signed)
Last filled 04/21/13 # 90 Call when ready [phone]

## 2013-07-08 MED ORDER — OXYCODONE-ACETAMINOPHEN 5-325 MG PO TABS: 1.0000 | ORAL_TABLET | Freq: Three times a day (TID) | ORAL | Status: DC | PRN

## 2013-07-08 NOTE — Telephone Encounter (Signed)
Pt informed Rx is ready 

## 2013-07-21 ENCOUNTER — Other Ambulatory Visit (HOSPITAL_COMMUNITY)
Admission: RE | Admit: 2013-07-21 | Discharge: 2013-07-21 | Disposition: A | Discharge status: Home / Self Care | Payer: Medicaid Other | Source: Ambulatory Visit | Attending: Internal Medicine | Admitting: Internal Medicine

## 2013-07-21 ENCOUNTER — Encounter: Payer: Self-pay | Admitting: Internal Medicine

## 2013-07-21 ENCOUNTER — Ambulatory Visit (INDEPENDENT_AMBULATORY_CARE_PROVIDER_SITE_OTHER): Payer: Medicaid Other | Admitting: Internal Medicine

## 2013-07-21 VITALS — BP 126/86 | HR 84 | Temp 97.7°F | Ht 67.0 in | Wt 164.1 lb

## 2013-07-21 DIAGNOSIS — Z3042 Encounter for surveillance of injectable contraceptive: Secondary | ICD-10-CM

## 2013-07-21 DIAGNOSIS — Z1151 Encounter for screening for human papillomavirus (HPV): Secondary | ICD-10-CM | POA: Diagnosis present

## 2013-07-21 DIAGNOSIS — Z01419 Encounter for gynecological examination (general) (routine) without abnormal findings: Secondary | ICD-10-CM | POA: Insufficient documentation

## 2013-07-21 DIAGNOSIS — Z0000 Encounter for general adult medical examination without abnormal findings: Secondary | ICD-10-CM

## 2013-07-21 DIAGNOSIS — G8929 Other chronic pain: Secondary | ICD-10-CM

## 2013-07-21 DIAGNOSIS — M549 Dorsalgia, unspecified: Secondary | ICD-10-CM

## 2013-07-21 DIAGNOSIS — M545 Low back pain, unspecified: Secondary | ICD-10-CM

## 2013-07-21 MED ORDER — MEDROXYPROGESTERONE ACETATE 150 MG/ML IM SUSP: 150.0000 mg | INTRAMUSCULAR | Status: DC

## 2013-07-21 MED ORDER — MEDROXYPROGESTERONE ACETATE 150 MG/ML IM SUSP
150.0000 mg | Freq: Once | INTRAMUSCULAR | Status: AC
Start: 2013-07-21 — End: 2013-07-21
  Administered 2013-07-21: 150 mg via INTRAMUSCULAR

## 2013-07-21 MED ORDER — IBUPROFEN 800 MG PO TABS: 800.0000 mg | ORAL_TABLET | Freq: Three times a day (TID) | ORAL | Status: DC | PRN

## 2013-07-21 NOTE — Patient Instructions (Signed)
We will be referring you for physical therapy. Also for now we have prescribed a stronger dose of ibuprofen which you should take for your migraine and your back pain. If this doesnt work we will refer you to a pain clinic.  For now we will do an xray of your back.   General Instructions:   Please bring your medicines with you each time you come to clinic.  Medicines may include prescription medications, over-the-counter medications, herbal remedies, eye drops, vitamins, or other pills.

## 2013-07-21 NOTE — Progress Notes (Signed)
Patient ID: Rebecca Holmes, female   DOB: [birth year], 38 y.o.   MRN: [id number]   Subjective:   Patient ID: Rebecca Holmes female   DOB: [birth year] 37 y.o.   MRN: [id number]  HPI: [name] is a 38 y.o. with PMH of HTN, Asthma, tobacco abuse, presented for routine follow up visit and to get a pap smear.  Please see problem based charting for assessment and plan.    Past Medical History  Diagnosis Date  . Heart murmur   . Left ventricular dysfunction     Decreased; Low E 20-25% in the setting of severe illness requiring ICU care 06/2008; Cardiolyte 06/13/2009 normal perfusion w/o significant ischemia or scar; LVEF 38%  . Respiratory failure     Ventilator Dependent; 2/2 status asthmatics 6/11-18/2010 admit [facility]  . HTN (hypertension)     Try off ACE 06/13/2009 due to pseudoasthma  . Vision disorder   . Anemia, iron deficiency   . Asthma     vs Pseudoasthma from ACE; Trial off ACE > PFT's normal 09/09/09 x DLCO 59 > corrects to 73%  . Depression   . Insomnia    Current Outpatient Prescriptions  Medication Sig Dispense Refill  . acetaminophen (TYLENOL) 500 MG tablet Take 400-800 mg by mouth as needed for moderate pain or headache.       . albuterol (PROVENTIL HFA;VENTOLIN HFA) 108 (90 BASE) MCG/ACT inhaler Inhale 2 puffs into the lungs every 6 (six) hours as needed. For shortness of breath  1 Inhaler  7  . albuterol (PROVENTIL) (2.5 MG/3ML) 0.083% nebulizer solution Take 3 mLs (2.5 mg total) by nebulization every 6 (six) hours as needed for wheezing or shortness of breath.  120 vial  12  . cyclobenzaprine (FLEXERIL) 10 MG tablet Take 1 tablet (10 mg total) by mouth 2 (two) times daily as needed for muscle spasms.  30 tablet  3  . DULoxetine (CYMBALTA) 60 MG capsule Take 1 capsule (60 mg total) by mouth every morning.  90 capsule  1  . fluticasone (FLONASE) 50 MCG/ACT nasal spray Place 2 sprays into both nostrils daily.  16 g  2  . Fluticasone-Salmeterol (ADVAIR) 250-50 MCG/DOSE AEPB  Inhale 1 puff into the lungs 2 (two) times daily.  60 each  7  . guaiFENesin (MUCINEX) 600 MG 12 hr tablet Take 1 tablet (600 mg total) by mouth 2 (two) times daily.  12 tablet  0  . ibuprofen (ADVIL,MOTRIN) 800 MG tablet Take 1 tablet (800 mg total) by mouth every 8 (eight) hours as needed.  30 tablet  0  . losartan (COZAAR) 25 MG tablet Take 1 tablet (25 mg total) by mouth daily.  90 tablet  3  . medroxyPROGESTERone (DEPO-PROVERA) 150 MG/ML injection Inject 1 mL (150 mg total) into the muscle every 3 (three) months.  1 mL  o  . oxyCODONE-acetaminophen (PERCOCET/ROXICET) 5-325 MG per tablet Take 1 tablet by mouth every 8 (eight) hours as needed for severe pain.  30 tablet  0  . QUEtiapine (SEROQUEL) 50 MG tablet Take 1 tablet (50 mg total) by mouth at bedtime.  30 tablet  1   No current facility-administered medications for this visit.   Family History  Problem Relation Age of Onset  . Diabetes Mother 72  . Cancer Mother     unknown   . Diabetes type II Brother 20  . Asthma Maternal Grandfather   . Asthma Daughter    History   Social History  .  Marital Status: Single    Spouse Name: N/A    Number of Children: 5  . Years of Education: 11th grade   Occupational History  . unemployed     2001   Social History Main Topics  . Smoking status: Current Every Day Smoker -- 0.50 packs/day for 19 years    Types: Cigarettes  . Smokeless tobacco: Never Used     Comment: PATIENT NOT READY  TO QUIT.  Marland Kitchen. Alcohol Use: Yes     Comment: occasionally  . Drug Use: No  . Sexual Activity: Yes    Partners: Male   Other Topics Concern  . Not on file   Social History Narrative   Patient is currently not working, Lives [place] boy friend [place]. 5 kids, 3 boys, 2 girls.    Review of Systems: CONSTITUTIONAL- No Fever, weightloss, night sweat or change in appetite, complaints of chronic back pain. SKIN- No Rash, colour changes or itching. HEAD- Complaints of headache, no dizziness. EYES- No  Vision loss, pain, redness, double or blurred vision. EARS- No vertigo, hearing loss or ear discharge. Mouth/throat- No Sorethroat, dentures, or bleeding gums. RESPIRATORY- No Cough or SOB. CARDIAC- No Palpitations, DOE, PND or chest pain. GI- No nausea, vomiting, diarrhoea, constipation, abd pain. URINARY- No Frequency, urgency, straining or dysuria. NEUROLOGIC- No Numbness, syncope, seizures or burning. [facility]- Denies depression or anxiety.  Objective:  Physical Exam: Filed Vitals:   07/21/13 1514  BP: 126/86  Pulse: 84  Temp: 97.7 F (36.5 C)  TempSrc: Oral  Height: 5\' 7"  (1.702 m)  Weight: 164 lb 1.6 oz (74.435 kg)  SpO2: 99%   GENERAL- alert, co-operative, appears as stated age, not in any distress. HEENT- Atraumatic, normocephalic, PERRL, EOMI, oral mucosa appears moist, good and intact dentition, neck supple. CARDIAC- RRR, no murmurs, rubs or gallops. RESP- Moving equal volumes of air, and clear to auscultation bilaterally, no wheezes or crackles. ABDOMEN- Soft, nontender, no guarding or rebound, no palpable masses or organomegaly, bowel sounds present. BACK- Normal curvature of the spine, No tenderness along the vertebrae. NEURO- No obvious Cr N abnormality, strenght upper and lower extremities- 5/5. EXTREMITIES- pulse 2+, symmetric, no pedal edema, straight leg raise test- worse on right. SKIN- Warm, dry, No rash or lesion. PSYCH- Normal mood and affect, appropriate thought content and speech.  Assessment & Plan:  The patient's case and plan of care was discussed with attending physician, Dr. Tenny CrawN. Narendra.  Please see problem based charting for assessment and plan.

## 2013-07-21 NOTE — Addendum Note (Signed)
Addended by: Onnie BoerEMOKPAE, Mystery Schrupp E on: 07/21/2013 06:16 PM   Modules accepted: Orders

## 2013-07-22 LAB — CYTOLOGY - PAP

## 2013-07-23 NOTE — Assessment & Plan Note (Signed)
Pap smear done today

## 2013-07-23 NOTE — Assessment & Plan Note (Signed)
Complaints of persistent back pain. Will order a lumber xray, last done in 2011.  Plan-  - Lumber xray - ibuprofen 800mg  TID PRN. - Refferal for physical therapy. - Will not continue pain meds-percocets for now.

## 2013-07-24 NOTE — Progress Notes (Signed)
INTERNAL MEDICINE TEACHING ATTENDING ADDENDUM - Jannice Beitzel, MD: I reviewed and discussed at the time of visit with the resident Dr. Emokpae, the patient's medical history, physical examination, diagnosis and results of pertinent tests and treatment and I agree with the patient's care as documented.  

## 2013-08-10 ENCOUNTER — Ambulatory Visit: Payer: Medicaid Other | Attending: Internal Medicine | Admitting: Physical Therapy

## 2013-08-18 ENCOUNTER — Other Ambulatory Visit: Payer: Self-pay | Admitting: *Deleted

## 2013-08-18 DIAGNOSIS — M549 Dorsalgia, unspecified: Secondary | ICD-10-CM

## 2013-08-20 NOTE — Telephone Encounter (Signed)
Have attempted to speak w/ pt

## 2013-08-25 ENCOUNTER — Encounter: Payer: Medicaid Other | Admitting: Internal Medicine

## 2013-09-07 ENCOUNTER — Other Ambulatory Visit: Payer: Self-pay | Admitting: Internal Medicine

## 2013-09-23 ENCOUNTER — Encounter: Payer: Self-pay | Admitting: *Deleted

## 2013-09-26 ENCOUNTER — Other Ambulatory Visit: Payer: Self-pay | Admitting: Internal Medicine

## 2013-10-13 ENCOUNTER — Encounter: Payer: Self-pay | Admitting: Internal Medicine

## 2013-10-13 ENCOUNTER — Ambulatory Visit (INDEPENDENT_AMBULATORY_CARE_PROVIDER_SITE_OTHER): Payer: Medicaid Other | Admitting: Internal Medicine

## 2013-10-13 VITALS — BP 131/97 | HR 89 | Temp 98.4°F | Ht 67.0 in | Wt 172.8 lb

## 2013-10-13 DIAGNOSIS — G8929 Other chronic pain: Secondary | ICD-10-CM

## 2013-10-13 DIAGNOSIS — I10 Essential (primary) hypertension: Secondary | ICD-10-CM

## 2013-10-13 DIAGNOSIS — F32A Depression, unspecified: Secondary | ICD-10-CM

## 2013-10-13 DIAGNOSIS — Z720 Tobacco use: Secondary | ICD-10-CM

## 2013-10-13 DIAGNOSIS — J4530 Mild persistent asthma, uncomplicated: Secondary | ICD-10-CM

## 2013-10-13 DIAGNOSIS — Z3042 Encounter for surveillance of injectable contraceptive: Secondary | ICD-10-CM

## 2013-10-13 DIAGNOSIS — M549 Dorsalgia, unspecified: Secondary | ICD-10-CM

## 2013-10-13 DIAGNOSIS — F329 Major depressive disorder, single episode, unspecified: Secondary | ICD-10-CM

## 2013-10-13 MED ORDER — MEDROXYPROGESTERONE ACETATE 150 MG/ML IM SUSP: 150.0000 mg | INTRAMUSCULAR | Status: DC

## 2013-10-13 MED ORDER — MEDROXYPROGESTERONE ACETATE 150 MG/ML IM SUSP
150.0000 mg | Freq: Once | INTRAMUSCULAR | Status: AC
  Administered 2013-10-13: 150 mg via INTRAMUSCULAR

## 2013-10-13 MED ORDER — LOSARTAN POTASSIUM 25 MG PO TABS: 25.0000 mg | ORAL_TABLET | Freq: Every day | ORAL | Status: DC

## 2013-10-13 MED ORDER — ALBUTEROL SULFATE HFA 108 (90 BASE) MCG/ACT IN AERS: 2.0000 | INHALATION_SPRAY | Freq: Four times a day (QID) | RESPIRATORY_TRACT | Status: DC | PRN

## 2013-10-13 MED ORDER — ALBUTEROL SULFATE (2.5 MG/3ML) 0.083% IN NEBU: 2.5000 mg | INHALATION_SOLUTION | Freq: Four times a day (QID) | RESPIRATORY_TRACT | Status: DC | PRN

## 2013-10-13 MED ORDER — FLUTICASONE-SALMETEROL 250-50 MCG/DOSE IN AEPB: 1.0000 | INHALATION_SPRAY | Freq: Two times a day (BID) | RESPIRATORY_TRACT | Status: DC

## 2013-10-13 MED ORDER — OXYCODONE-ACETAMINOPHEN 5-325 MG PO TABS: 1.0000 | ORAL_TABLET | Freq: Every day | ORAL | Status: DC

## 2013-10-13 NOTE — Assessment & Plan Note (Addendum)
BP Readings from Last 3 Encounters:  10/13/13 131/97  07/21/13 126/86  05/18/13 110/90    Lab Results  Component Value Date   NA 138 05/08/2013   K 3.9 05/08/2013   CREATININE 0.94 05/08/2013    Assessment: Blood pressure control:  Fair, raised diastolic Progress toward BP goal:   Not at goal Comments: Did not take meds today, Lorsatan- 25mg  daily.  Plan: Medications:  continue current medications Educational resources provided:   Self management tools provided:   Other plans:

## 2013-10-13 NOTE — Assessment & Plan Note (Addendum)
Complaint. Appears well controlled. Advised on th use of rescue inhaler. Also compliant with advair. Has night time awakening, because she sometimes feels hot, with SOB, but improves with drinking cold water, unlikley related to asthma, will therefore not adjust meds today.

## 2013-10-13 NOTE — Patient Instructions (Signed)
General Instructions:    PLS   PLS   PLS  get your xray done as soon as possible. We will not be prescribing anymore pain meds from now if you do not get it done.    We will prescribe a short supply of medications for you, if your xray does not show any thing, we will be sending you to a back surgeon, as you are complaining of tingling and numbness and that might need surgery.   Please bring your medicines with you each time you come to clinic.  Medicines may include prescription medications, over-the-counter medications, herbal remedies, eye drops, vitamins, or other pills.

## 2013-10-13 NOTE — Assessment & Plan Note (Addendum)
Not ready to quit- in pre-contemplation stage. Says it relieves stress. Information on smoking cessation given.

## 2013-10-13 NOTE — Assessment & Plan Note (Signed)
Pt still hasn't had lumber xray done. Pt with pain, worse over the past month, with unilat- numbness and tuingling and tenderness on palpation of her back. Unresponsive to ibuprofen, flexeril, and physical therapy. Pt says oxycodone is the only med that has worked. Ansd says her quality of life is reduced due to pain. Pt doses not work, and applied for disability but did not get it.  Plan- Lumber xray. - If unrevealing pt will need an MRI and refferal to orthopedic surgery. - Pt isi on cymbalta, which has increased risk of serotonin syndrom if combined with tramadol, will therefore order Oxycodone-acetaminopen- 5-325mg   30 tabs, BID PRN. Till imaging results.

## 2013-10-13 NOTE — Progress Notes (Signed)
Patient ID: Rebecca Holmes, female   DOB: December 01, 1975, 38 y.o.   MRN: [id number]   Subjective:   Patient ID: Rebecca Holmes female   DOB: 25-Jan-1975 38 y.o.   MRN: [id number]  HPI: [name] is a 37 y.o. H PMH listed below, presented today with back pain worse over the past week, says when she sits she cann ot get up without help. Throbbing pain down the front of her thighs, no recent falls. Chronic back pain started in high school, 2008, pain started she had been getting inh=jections in her back. Has chronic migranes, no problems with passing urine or bowel movement. Pain is worse in the morings, no other joint pain. No fever, no rash. No weightloss.  Numbness and tingling down left leg- >73month, intermittent, not currently present.  No weakness.  Physical therapy did not help. Oxycodone helped pain. Pt is on her sons disbillity noy working, she says he couldnt find a job.   Past Medical History  Diagnosis Date  . Heart murmur   . Left ventricular dysfunction     Decreased; Low E 20-25% in the setting of severe illness requiring ICU care 06/2008; Cardiolyte 06/13/2009 normal perfusion w/o significant ischemia or scar; LVEF 38%  . Respiratory failure     Ventilator Dependent; 2/2 status asthmatics 6/11-18/2010 admit [facility]  . HTN (hypertension)     Try off ACE 06/13/2009 due to pseudoasthma  . Vision disorder   . Anemia, iron deficiency   . Asthma     vs Pseudoasthma from ACE; Trial off ACE > PFT's normal 09/09/09 x DLCO 59 > corrects to 73%  . Depression   . Insomnia    Current Outpatient Prescriptions  Medication Sig Dispense Refill  . acetaminophen (TYLENOL) 500 MG tablet Take 400-800 mg by mouth as needed for moderate pain or headache.       . albuterol (PROVENTIL HFA;VENTOLIN HFA) 108 (90 BASE) MCG/ACT inhaler Inhale 2 puffs into the lungs every 6 (six) hours as needed. For shortness of breath  1 Inhaler  7  . albuterol (PROVENTIL) (2.5 MG/3ML) 0.083% nebulizer solution Take 3 mLs (2.5  mg total) by nebulization every 6 (six) hours as needed for wheezing or shortness of breath.  120 vial  12  . cyclobenzaprine (FLEXERIL) 10 MG tablet Take 1 tablet (10 mg total) by mouth 2 (two) times daily as needed for muscle spasms.  30 tablet  3  . DULoxetine (CYMBALTA) 60 MG capsule Take 1 capsule (60 mg total) by mouth every morning.  90 capsule  1  . fluticasone (FLONASE) 50 MCG/ACT nasal spray Place 2 sprays into both nostrils daily.  16 g  2  . Fluticasone-Salmeterol (ADVAIR) 250-50 MCG/DOSE AEPB Inhale 1 puff into the lungs 2 (two) times daily.  60 each  7  . guaiFENesin (MUCINEX) 600 MG 12 hr tablet Take 1 tablet (600 mg total) by mouth 2 (two) times daily.  12 tablet  0  . ibuprofen (ADVIL,MOTRIN) 800 MG tablet Take 1 tablet (800 mg total) by mouth every 8 (eight) hours as needed.  30 tablet  0  . losartan (COZAAR) 25 MG tablet Take 1 tablet (25 mg total) by mouth daily.  90 tablet  3  . medroxyPROGESTERone (DEPO-PROVERA) 150 MG/ML injection Inject 1 mL (150 mg total) into the muscle every 3 (three) months.  1 mL  o  . oxyCODONE-acetaminophen (PERCOCET/ROXICET) 5-325 MG per tablet Take 1 tablet by mouth every 8 (eight) hours as needed for  severe pain.  30 tablet  0  . QUEtiapine (SEROQUEL) 50 MG tablet Take 1 tablet (50 mg total) by mouth at bedtime.  30 tablet  1   No current facility-administered medications for this visit.   Family History  Problem Relation Age of Onset  . Diabetes Mother 5650  . Cancer Mother     unknown   . Diabetes type II Brother 20  . Asthma Maternal Grandfather   . Asthma Daughter    History   Social History  . Marital Status: Single    Spouse Name: N/A    Number of Children: 5  . Years of Education: 11th grade   Occupational History  . unemployed     2001   Social History Main Topics  . Smoking status: Current Every Day Smoker -- 0.50 packs/day for 19 years    Types: Cigarettes  . Smokeless tobacco: Never Used     Comment: PATIENT NOT READY   TO QUIT.  Marland Kitchen. Alcohol Use: Yes     Comment: occasionally  . Drug Use: No  . Sexual Activity: Yes    Partners: Male   Other Topics Concern  . Not on file   Social History Narrative   Patient is currently not working, Lives [place] friend [place]. 5 kids, 3 boys, 2 girls.    Review of Systems: CONSTITUTIONAL- No Fever, weightloss, night sweat or change in appetite. SKIN- No Rash, colour changes or itching. HEAD- No Headache or dizziness. EYES- No Vision loss, pain, redness, double or blurred vision. EARS- No vertigo, hearing loss or ear discharge. RESPIRATORY- No Cough or SOB. CARDIAC- No Palpitations, DOE, PND or chest pain. GI- No nausea, vomiting, diarrhoea, constipation, abd pain. URINARY- No Frequency, urgency, straining or dysuria. NEUROLOGIC- No Numbness, syncope, seizures or burning. [facility]- Denies depression or anxiety.  Objective:  Physical Exam: Filed Vitals:   10/13/13 1331  BP: 131/97  Pulse: 89  Temp: 98.4 F (36.9 C)  TempSrc: Oral  Height: 5\' 7"  (1.702 m)  Weight: 172 lb 12.8 oz (78.382 kg)  SpO2: 100%   GENERAL- alert, co-operative, appears as stated age, not in any distress. HEENT- Atraumatic, normocephalic, PERRL, EOMI, oral mucosa appears moist, good and intact dentition. No carotid bruit, no cervical LN enlargement, thyroid does not appear enlarged, neck supple. CARDIAC- RRR, no murmurs, rubs or gallops. RESP- Moving equal volumes of air, and clear to auscultation bilaterally, no wheezes or crackles. ABDOMEN- Soft, nontender, no guarding or rebound, no palpable masses or organomegaly, bowel sounds present. BACK- Normal curvature of the spine, No tenderness along the vertebrae, no CVA tenderness. NEURO- No obvious Cr N abnormality, strenght upper and lower extremities- 5/5, Sensation intact- globally, DTRs- Normal, finger to nose test normal bilat, rapid alternating movement- intact, Gait- Normal. EXTREMITIES- pulse 2+, symmetric, no pedal  edema. SKIN- Warm, dry, No rash or lesion. PSYCH- Normal mood and affect, appropriate thought content and speech.  Assessment & Plan:   The patient's case and plan of care was discussed with attending physician, Dr. Heide SparkNarendra.  Please see problem based charting for assessment and plan.

## 2013-10-13 NOTE — Assessment & Plan Note (Signed)
Pt to follow up with psych this week. On cymbalta.

## 2013-10-14 ENCOUNTER — Telehealth: Payer: Self-pay | Admitting: *Deleted

## 2013-10-14 NOTE — Telephone Encounter (Signed)
Pt said the flexeril was not helping so I deleted it and did not re-prescribe it. Also the Depot was a mistake, was to be given in clinic which she got when she was here yesterday. Talked to pt about it, and explained my error, and that she could use it for her next depot shot. Pt voiced understanding and agreed.    Ejiro.

## 2013-10-14 NOTE — Telephone Encounter (Signed)
Pt called stating she did not get a refill on Flexeril.  It's not on med list.  Will you refill? Also a Rx for depo was sent to pharmacy.  Pt has 1 ml vial at home, this is for injection.  I told pt to try and return, if they will not accept back she can bring it in at her next Depo visit.

## 2013-10-14 NOTE — Progress Notes (Signed)
INTERNAL MEDICINE TEACHING ATTENDING ADDENDUM - Ahman Dugdale, MD: I reviewed and discussed at the time of visit with the resident Dr. Emokpae, the patient's medical history, physical examination, diagnosis and results of pertinent tests and treatment and I agree with the patient's care as documented.  

## 2013-12-16 ENCOUNTER — Other Ambulatory Visit: Payer: Self-pay | Admitting: *Deleted

## 2013-12-16 DIAGNOSIS — G8929 Other chronic pain: Secondary | ICD-10-CM

## 2013-12-16 DIAGNOSIS — M549 Dorsalgia, unspecified: Principal | ICD-10-CM

## 2013-12-16 NOTE — Telephone Encounter (Signed)
Last refill 10/7 Pt # [phone]

## 2014-01-04 ENCOUNTER — Telehealth: Payer: Self-pay | Admitting: *Deleted

## 2014-01-04 ENCOUNTER — Ambulatory Visit (INDEPENDENT_AMBULATORY_CARE_PROVIDER_SITE_OTHER): Payer: Medicaid Other | Admitting: *Deleted

## 2014-01-04 DIAGNOSIS — Z3042 Encounter for surveillance of injectable contraceptive: Secondary | ICD-10-CM

## 2014-01-04 DIAGNOSIS — Z308 Encounter for other contraceptive management: Secondary | ICD-10-CM

## 2014-01-04 MED ORDER — MEDROXYPROGESTERONE ACETATE 150 MG/ML IM SUSP
150.0000 mg | INTRAMUSCULAR | Status: DC
  Administered 2014-01-04: 150 mg via INTRAMUSCULAR

## 2014-01-04 NOTE — Telephone Encounter (Signed)
Pt came to clinic today and asked about refill on Oxycodone.  I told her it was refused. She is asking for something for relief of chronic back pain.   She was given an appointment for next week, 1/7 to talk about pain meds for chronic use. I advise OTC meds until next week. Please advise.

## 2014-01-06 NOTE — Telephone Encounter (Signed)
She can come for her appointment next week. She was supposed to get an xray done, months ago. I agree with you, she should try over the counter meds.  Thanks.  Ejiro.

## 2014-01-14 ENCOUNTER — Encounter: Payer: Medicaid Other | Admitting: Internal Medicine

## 2014-01-28 ENCOUNTER — Encounter: Payer: Medicaid Other | Admitting: Internal Medicine

## 2014-02-16 ENCOUNTER — Encounter: Payer: Medicaid Other | Admitting: Internal Medicine

## 2014-02-16 ENCOUNTER — Encounter: Payer: Self-pay | Admitting: Internal Medicine

## 2014-03-23 ENCOUNTER — Ambulatory Visit (INDEPENDENT_AMBULATORY_CARE_PROVIDER_SITE_OTHER): Payer: Medicaid Other | Admitting: *Deleted

## 2014-03-23 DIAGNOSIS — Z3042 Encounter for surveillance of injectable contraceptive: Secondary | ICD-10-CM

## 2014-03-23 DIAGNOSIS — Z308 Encounter for other contraceptive management: Secondary | ICD-10-CM

## 2014-03-23 MED ORDER — MEDROXYPROGESTERONE ACETATE 150 MG/ML IM SUSP
150.0000 mg | INTRAMUSCULAR | Status: DC
  Administered 2014-03-23: 150 mg via INTRAMUSCULAR

## 2014-03-23 NOTE — Addendum Note (Signed)
Addended by: Onnie BoerEMOKPAE, Rueben Kassim E on: 03/23/2014 06:18 PM   Modules accepted: Orders

## 2014-06-08 ENCOUNTER — Ambulatory Visit (INDEPENDENT_AMBULATORY_CARE_PROVIDER_SITE_OTHER): Payer: Medicaid Other | Admitting: Internal Medicine

## 2014-06-08 ENCOUNTER — Encounter: Payer: Self-pay | Admitting: Internal Medicine

## 2014-06-08 VITALS — BP 123/85 | HR 101 | Temp 98.6°F | Ht 67.0 in | Wt 164.3 lb

## 2014-06-08 DIAGNOSIS — Z308 Encounter for other contraceptive management: Secondary | ICD-10-CM | POA: Diagnosis not present

## 2014-06-08 DIAGNOSIS — M549 Dorsalgia, unspecified: Secondary | ICD-10-CM | POA: Diagnosis not present

## 2014-06-08 DIAGNOSIS — Z3042 Encounter for surveillance of injectable contraceptive: Secondary | ICD-10-CM

## 2014-06-08 DIAGNOSIS — F329 Major depressive disorder, single episode, unspecified: Secondary | ICD-10-CM | POA: Diagnosis not present

## 2014-06-08 DIAGNOSIS — F17210 Nicotine dependence, cigarettes, uncomplicated: Secondary | ICD-10-CM

## 2014-06-08 DIAGNOSIS — I10 Essential (primary) hypertension: Secondary | ICD-10-CM

## 2014-06-08 DIAGNOSIS — J45909 Unspecified asthma, uncomplicated: Secondary | ICD-10-CM | POA: Diagnosis not present

## 2014-06-08 DIAGNOSIS — Z3049 Encounter for surveillance of other contraceptives: Secondary | ICD-10-CM | POA: Diagnosis present

## 2014-06-08 DIAGNOSIS — I509 Heart failure, unspecified: Secondary | ICD-10-CM | POA: Diagnosis not present

## 2014-06-08 DIAGNOSIS — M5441 Lumbago with sciatica, right side: Secondary | ICD-10-CM

## 2014-06-08 MED ORDER — METHOCARBAMOL 500 MG PO TABS
1000.0000 mg | ORAL_TABLET | Freq: Four times a day (QID) | ORAL | Status: DC | PRN
Start: 2014-06-08 — End: 2014-12-31

## 2014-06-08 MED ORDER — MEDROXYPROGESTERONE ACETATE 150 MG/ML IM SUSP
150.0000 mg | Freq: Once | INTRAMUSCULAR | Status: AC
  Administered 2014-06-08: 150 mg via INTRAMUSCULAR

## 2014-06-08 MED ORDER — MEDROXYPROGESTERONE ACETATE 150 MG/ML IM SUSP: 150.0000 mg | INTRAMUSCULAR | Status: DC
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
## 2014-06-08 NOTE — Assessment & Plan Note (Signed)
No signs of fluid overload. EF seems to have recovered- EF- 01/2013- 55-60%, from 20% in 2010, which was in the setting of a severe COPD exacerbation. - Cont Lorsatan 25mg  daily

## 2014-06-08 NOTE — Assessment & Plan Note (Signed)
Depo shot today.

## 2014-06-08 NOTE — Patient Instructions (Signed)
We will be drawing some lab work today, to be sure the medication you are on- Yolanda MangesLorsatan is appropiate for you.  We will also like you to get the xray of your neck done.  We have prescribed a medication called Robaxin for you to help with your back pain, take this medication every 6 hours as needed.

## 2014-06-08 NOTE — Assessment & Plan Note (Addendum)
Pt previously followed with Psych for prescription of Quetiapine. She is also on Cymbalta. She needs [facility]. She wants one physician to prescribe her meds, I explained why the ordering physician has to follow up on her med. She voiced understanding. She says he was prescribed the quetiapine to help her sleep. She denies SI or HI. - Plan pt to follow up with PSych for med refill.

## 2014-06-08 NOTE — Assessment & Plan Note (Signed)
BP Readings from Last 3 Encounters:  06/08/14 123/85  10/13/13 131/97  07/21/13 126/86    Lab Results  Component Value Date   NA 138 05/08/2013   K 3.9 05/08/2013   CREATININE 0.94 05/08/2013    Assessment: Blood pressure control:  Controlled Progress toward BP goal:    Comments: Complaint with meds- Low dose Lorsatan 25mg  daily. Plan: Medications:  continue current medications Educational resources provided:   Self management tools provided:   Other plans: Bmet today

## 2014-06-08 NOTE — Assessment & Plan Note (Signed)
Chronic an unchanged. She wants a pain contract today. Flexeril has helped in the past. Paravertebral region is tender to palpation. Strenght lower extremities normal. - Will give robaxin for now- 1000mg  QID as needed. - Encourage exercise and ambulation. - Xray ordered month ago not done.  Numbness and tingling in right arm- Exam and symptom complaints not consistent. Planned cervical xray, pt says she will get it done. - Xray neck.

## 2014-06-08 NOTE — Assessment & Plan Note (Signed)
Patient still smoking cigarettes, no ready to quit or even contemplating it. She uses her rescue inhaler 2ce a week for some slight wheezing. She is also on advair- daily.

## 2014-06-09 NOTE — Progress Notes (Signed)
[facility] Medicine Clinic Attending  Case discussed with Dr. Emokpae soon after the resident saw the patient.  We reviewed the resident's history and exam and pertinent patient test results.  I agree with the assessment, diagnosis, and plan of care documented in the resident's note. 

## 2014-08-30 ENCOUNTER — Ambulatory Visit: Payer: Medicaid Other

## 2014-08-31 ENCOUNTER — Ambulatory Visit (INDEPENDENT_AMBULATORY_CARE_PROVIDER_SITE_OTHER): Payer: Medicaid Other | Admitting: *Deleted

## 2014-08-31 DIAGNOSIS — Z308 Encounter for other contraceptive management: Secondary | ICD-10-CM | POA: Diagnosis present

## 2014-08-31 DIAGNOSIS — Z3042 Encounter for surveillance of injectable contraceptive: Secondary | ICD-10-CM

## 2014-08-31 MED ORDER — MEDROXYPROGESTERONE ACETATE 150 MG/ML IM SUSP
150.0000 mg | INTRAMUSCULAR | Status: DC
  Administered 2014-08-31: 150 mg via INTRAMUSCULAR

## 2014-09-21 NOTE — Addendum Note (Signed)
Addended by: Kennis Carina E on: 09/21/2014 11:00 AM   Modules accepted: Orders

## 2014-10-07 ENCOUNTER — Other Ambulatory Visit: Payer: Self-pay | Admitting: Internal Medicine

## 2014-10-08 ENCOUNTER — Emergency Department (HOSPITAL_COMMUNITY): Payer: Medicaid Other

## 2014-10-08 ENCOUNTER — Emergency Department (HOSPITAL_COMMUNITY)
Admission: EM | Admit: 2014-10-08 | Discharge: 2014-10-08 | Disposition: A | Discharge status: Home / Self Care | Payer: Medicaid Other | Attending: Emergency Medicine | Admitting: Emergency Medicine

## 2014-10-08 ENCOUNTER — Encounter (HOSPITAL_COMMUNITY): Payer: Self-pay

## 2014-10-08 DIAGNOSIS — G4700 Insomnia, unspecified: Secondary | ICD-10-CM | POA: Insufficient documentation

## 2014-10-08 DIAGNOSIS — F329 Major depressive disorder, single episode, unspecified: Secondary | ICD-10-CM | POA: Insufficient documentation

## 2014-10-08 DIAGNOSIS — J45901 Unspecified asthma with (acute) exacerbation: Secondary | ICD-10-CM | POA: Diagnosis not present

## 2014-10-08 DIAGNOSIS — Z9911 Dependence on respirator [ventilator] status: Secondary | ICD-10-CM | POA: Diagnosis not present

## 2014-10-08 DIAGNOSIS — Z8679 Personal history of other diseases of the circulatory system: Secondary | ICD-10-CM | POA: Insufficient documentation

## 2014-10-08 DIAGNOSIS — Z7952 Long term (current) use of systemic steroids: Secondary | ICD-10-CM | POA: Diagnosis not present

## 2014-10-08 DIAGNOSIS — Z862 Personal history of diseases of the blood and blood-forming organs and certain disorders involving the immune mechanism: Secondary | ICD-10-CM | POA: Diagnosis not present

## 2014-10-08 DIAGNOSIS — R509 Fever, unspecified: Secondary | ICD-10-CM | POA: Diagnosis not present

## 2014-10-08 DIAGNOSIS — Z720 Tobacco use: Secondary | ICD-10-CM | POA: Diagnosis not present

## 2014-10-08 DIAGNOSIS — I10 Essential (primary) hypertension: Secondary | ICD-10-CM | POA: Insufficient documentation

## 2014-10-08 DIAGNOSIS — Z8669 Personal history of other diseases of the nervous system and sense organs: Secondary | ICD-10-CM | POA: Diagnosis not present

## 2014-10-08 DIAGNOSIS — Z79899 Other long term (current) drug therapy: Secondary | ICD-10-CM | POA: Diagnosis not present

## 2014-10-08 DIAGNOSIS — R0789 Other chest pain: Secondary | ICD-10-CM | POA: Diagnosis not present

## 2014-10-08 DIAGNOSIS — R0602 Shortness of breath: Secondary | ICD-10-CM | POA: Diagnosis present

## 2014-10-08 DIAGNOSIS — R000 Tachycardia, unspecified: Secondary | ICD-10-CM | POA: Insufficient documentation

## 2014-10-08 DIAGNOSIS — R011 Cardiac murmur, unspecified: Secondary | ICD-10-CM | POA: Diagnosis not present

## 2014-10-08 MED ORDER — MAGNESIUM SULFATE 2 GM/50ML IV SOLN
2.0000 g | Freq: Once | INTRAVENOUS | Status: AC
  Administered 2014-10-08: 2 g via INTRAVENOUS
  Filled 2014-10-08: qty 50

## 2014-10-08 MED ORDER — PREDNISONE 20 MG PO TABS
40.0000 mg | ORAL_TABLET | Freq: Every day | ORAL | Status: DC
Start: 2014-10-08 — End: 2014-12-31

## 2014-10-08 MED ORDER — FLUTICASONE-SALMETEROL 250-50 MCG/DOSE IN AEPB: INHALATION_SPRAY | RESPIRATORY_TRACT | Status: DC

## 2014-10-08 MED ORDER — IPRATROPIUM BROMIDE 0.02 % IN SOLN
1.0000 mg | Freq: Once | RESPIRATORY_TRACT | Status: AC
  Administered 2014-10-08: 1 mg via RESPIRATORY_TRACT
  Filled 2014-10-08: qty 5

## 2014-10-08 MED ORDER — ALBUTEROL (5 MG/ML) CONTINUOUS INHALATION SOLN
10.0000 mg/h | INHALATION_SOLUTION | Freq: Once | RESPIRATORY_TRACT | Status: AC
  Administered 2014-10-08: 10 mg/h via RESPIRATORY_TRACT
  Filled 2014-10-08: qty 20

## 2014-10-08 MED ORDER — METHYLPREDNISOLONE SODIUM SUCC 125 MG IJ SOLR
125.0000 mg | Freq: Once | INTRAMUSCULAR | Status: AC
  Administered 2014-10-08: 125 mg via INTRAVENOUS
  Filled 2014-10-08: qty 2

## 2014-10-08 NOTE — Discharge Instructions (Signed)
Asthma, Acute Bronchospasm °Acute bronchospasm caused by asthma is also referred to as an asthma attack. Bronchospasm means your air passages become narrowed. The narrowing is caused by inflammation and tightening of the muscles in the air tubes (bronchi) in your lungs. This can make it hard to breathe or cause you to wheeze and cough. °CAUSES °Possible triggers are: °· Animal dander from the skin, hair, or feathers of animals. °· Dust mites contained in house dust. °· Cockroaches. °· Pollen from trees or grass. °· Mold. °· Cigarette or tobacco smoke. °· Air pollutants such as dust, household cleaners, hair sprays, aerosol sprays, paint fumes, strong chemicals, or strong odors. °· Cold air or weather changes. Cold air may trigger inflammation. Winds increase molds and pollens in the air. °· Strong emotions such as crying or laughing hard. °· Stress. °· Certain medicines such as aspirin or beta-blockers. °· Sulfites in foods and drinks, such as dried fruits and wine. °· Infections or inflammatory conditions, such as a flu, cold, or inflammation of the nasal membranes (rhinitis). °· Gastroesophageal reflux disease (GERD). GERD is a condition where stomach acid backs up into your esophagus. °· Exercise or strenuous activity. °SIGNS AND SYMPTOMS  °· Wheezing. °· Excessive coughing, particularly at night. °· Chest tightness. °· Shortness of breath. °DIAGNOSIS  °Your health care provider will ask you about your medical history and perform a physical exam. A chest X-ray or blood testing may be performed to look for other causes of your symptoms or other conditions that may have triggered your asthma attack.  °TREATMENT  °Treatment is aimed at reducing inflammation and opening up the airways in your lungs.  Most asthma attacks are treated with inhaled medicines. These include quick relief or rescue medicines (such as bronchodilators) and controller medicines (such as inhaled corticosteroids). These medicines are sometimes  given through an inhaler or a nebulizer. Systemic steroid medicine taken by mouth or given through an IV tube also can be used to reduce the inflammation when an attack is moderate or severe. Antibiotic medicines are only used if a bacterial infection is present.  °HOME CARE INSTRUCTIONS  °· Rest. °· Drink plenty of liquids. This helps the mucus to remain thin and be easily coughed up. Only use caffeine in moderation and do not use alcohol until you have recovered from your illness. °· Do not smoke. Avoid being exposed to secondhand smoke. °· You play a critical role in keeping yourself in good health. Avoid exposure to things that cause you to wheeze or to have breathing problems. °· Keep your medicines up-to-date and available. Carefully follow your health care provider's treatment plan. °· Take your medicine exactly as prescribed. °· When pollen or pollution is bad, keep windows closed and use an air conditioner or go to places with air conditioning. °· Asthma requires careful medical care. See your health care provider for a follow-up as advised. If you are more than [redacted] weeks pregnant and you were prescribed any new medicines, let your obstetrician know about the visit and how you are doing. Follow up with your health care provider as directed. °· After you have recovered from your asthma attack, make an appointment with your outpatient doctor to talk about ways to reduce the likelihood of future attacks. If you do not have a doctor who manages your asthma, make an appointment with a primary care doctor to discuss your asthma. °SEEK IMMEDIATE MEDICAL CARE IF:  °· You are getting worse. °· You have trouble breathing. If severe, call your local   emergency services (911 in the [place] °· You develop chest pain or discomfort. °· You are vomiting. °· You are not able to keep fluids down. °· You are coughing up yellow, green, brown, or bloody sputum. °· You have a fever and your symptoms suddenly get worse. °· You have  trouble swallowing. °MAKE SURE YOU:  °· Understand these instructions. °· Will watch your condition. °· Will get help right away if you are not doing well or get worse. °Document Released: 04/11/2006 Document Revised: 12/30/2012 Document Reviewed: 07/02/2012 °ExitCare® Patient Information ©2015 [employer]. This information is not intended to replace advice given to you by your health care provider. Make sure you discuss any questions you have with your health care provider. ° °Asthma Attack Prevention °Although there is no way to prevent asthma from starting, you can take steps to control the disease and reduce its symptoms. Learn about your asthma and how to control it. Take an active role to control your asthma by working with your health care provider to create and follow an asthma action plan. An asthma action plan guides you in: °· Taking your medicines properly. °· Avoiding things that set off your asthma or make your asthma worse (asthma triggers). °· Tracking your level of asthma control. °· Responding to worsening asthma. °· Seeking emergency care when needed. °To track your asthma, keep records of your symptoms, check your peak flow number using a handheld device that shows how well air moves out of your lungs (peak flow meter), and get regular asthma checkups.  °WHAT ARE SOME WAYS TO PREVENT AN ASTHMA ATTACK? °· Take medicines as directed by your health care provider. °· Keep track of your asthma symptoms and level of control. °· With your health care provider, write a detailed plan for taking medicines and managing an asthma attack. Then be sure to follow your action plan. Asthma is an ongoing condition that needs regular monitoring and treatment. °· Identify and avoid asthma triggers. Many outdoor allergens and irritants (such as pollen, mold, cold air, and air pollution) can trigger asthma attacks. Find out what your asthma triggers are and take steps to avoid them. °· Monitor your breathing. Learn  to recognize warning signs of an attack, such as coughing, wheezing, or shortness of breath. Your lung function may decrease before you notice any signs or symptoms, so regularly measure and record your peak airflow with a home peak flow meter. °· Identify and treat attacks early. If you act quickly, you are less likely to have a severe attack. You will also need less medicine to control your symptoms. When your peak flow measurements decrease and alert you to an upcoming attack, take your medicine as instructed and immediately stop any activity that may have triggered the attack. If your symptoms do not improve, get medical help. °· Pay attention to increasing quick-relief inhaler use. If you find yourself relying on your quick-relief inhaler, your asthma is not under control. See your health care provider about adjusting your treatment. °WHAT CAN MAKE MY SYMPTOMS WORSE? °A number of common things can set off or make your asthma symptoms worse and cause temporary increased inflammation of your airways. Keep track of your asthma symptoms for several weeks, detailing all the environmental and emotional factors that are linked with your asthma. When you have an asthma attack, go back to your asthma diary to see which factor, or combination of factors, might have contributed to it. Once you know what these factors are, you can   take steps to control many of them. If you have allergies and asthma, it is important to take asthma prevention steps at home. Minimizing contact with the substance to which you are allergic will help prevent an asthma attack. Some triggers and ways to avoid these triggers are: °Animal Dander:  °Some people are allergic to the flakes of skin or dried saliva from animals with fur or feathers.  °· There is no such thing as a hypoallergenic dog or cat breed. All dogs or cats can cause allergies, even if they don't shed. °· Keep these pets out of your home. °· If you are not able to keep a pet  outdoors, keep the pet out of your bedroom and other sleeping areas at all times, and keep the door closed. °· Remove carpets and furniture covered with cloth from your home. If that is not possible, keep the pet away from fabric-covered furniture and carpets. °Dust Mites: °Many people with asthma are allergic to dust mites. Dust mites are tiny bugs that are found in every home in mattresses, pillows, carpets, fabric-covered furniture, bedcovers, clothes, stuffed toys, and other fabric-covered items.  °· Cover your mattress in a special dust-proof cover. °· Cover your pillow in a special dust-proof cover, or wash the pillow each week in hot water. Water must be hotter than 130° F (54.4° C) to kill dust mites. Cold or warm water used with detergent and bleach can also be effective. °· Wash the sheets and blankets on your bed each week in hot water. °· Try not to sleep or lie on cloth-covered cushions. °· Call ahead when traveling and ask for a smoke-free hotel room. Bring your own bedding and pillows in case the hotel only supplies feather pillows and down comforters, which may contain dust mites and cause asthma symptoms. °· Remove carpets from your bedroom and those laid on concrete, if you can. °· Keep stuffed toys out of the bed, or wash the toys weekly in hot water or cooler water with detergent and bleach. °Cockroaches: °Many people with asthma are allergic to the droppings and remains of cockroaches.  °· Keep food and garbage in closed containers. Never leave food out. °· Use poison baits, traps, powders, gels, or paste (for example, boric acid). °· If a spray is used to kill cockroaches, stay out of the room until the odor goes away. °Indoor Mold: °· Fix leaky faucets, pipes, or other sources of water that have mold around them. °· Clean floors and moldy surfaces with a fungicide or diluted bleach. °· Avoid using humidifiers, vaporizers, or swamp coolers. These can spread molds through the air. °Pollen and  Outdoor Mold: °· When pollen or mold spore counts are high, try to keep your windows closed. °· Stay indoors with windows closed from late morning to afternoon. Pollen and some mold spore counts are highest at that time. °· Ask your health care provider whether you need to take anti-inflammatory medicine or increase your dose of the medicine before your allergy season starts. °Other Irritants to Avoid: °· Tobacco smoke is an irritant. If you smoke, ask your health care provider how you can quit. Ask family members to quit smoking, too. Do not allow smoking in your home or car. °· If possible, do not use a wood-burning stove, kerosene heater, or fireplace. Minimize exposure to all sources of smoke, including incense, candles, fires, and fireworks. °· Try to stay away from strong odors and sprays, such as perfume, talcum powder, hair spray, and   paints. °· Decrease humidity in your home and use an indoor air cleaning device. Reduce indoor humidity to below 60%. Dehumidifiers or central air conditioners can do this. °· Decrease house dust exposure by changing furnace and air cooler filters frequently. °· Try to have someone else vacuum for you once or twice a week. Stay out of rooms while they are being vacuumed and for a short while afterward. °· If you vacuum, use a dust mask from a hardware store, a double-layered or microfilter vacuum cleaner bag, or a vacuum cleaner with a HEPA filter. °· Sulfites in foods and beverages can be irritants. Do not drink beer or wine or eat dried fruit, processed potatoes, or shrimp if they cause asthma symptoms. °· Cold air can trigger an asthma attack. Cover your nose and mouth with a scarf on cold or windy days. °· Several health conditions can make asthma more difficult to manage, including a runny nose, sinus infections, reflux disease, psychological stress, and sleep apnea. Work with your health care provider to manage these conditions. °· Avoid close contact with people who have  a respiratory infection such as a cold or the flu, since your asthma symptoms may get worse if you catch the infection. Wash your hands thoroughly after touching items that may have been handled by people with a respiratory infection. °· Get a flu shot every year to protect against the flu virus, which often makes asthma worse for days or weeks. Also get a pneumonia shot if you have not previously had one. Unlike the flu shot, the pneumonia shot does not need to be given yearly. °Medicines: °· Talk to your health care provider about whether it is safe for you to take aspirin or non-steroidal anti-inflammatory medicines (NSAIDs). In a small number of people with asthma, aspirin and NSAIDs can cause asthma attacks. These medicines must be avoided by people who have known aspirin-sensitive asthma. It is important that people with aspirin-sensitive asthma read labels of all over-the-counter medicines used to treat pain, colds, coughs, and fever. °· Beta-blockers and ACE inhibitors are other medicines you should discuss with your health care provider. °HOW CAN I FIND OUT WHAT I AM ALLERGIC TO? °Ask your asthma health care provider about allergy skin testing or blood testing (the RAST test) to identify the allergens to which you are sensitive. If you are found to have allergies, the most important thing to do is to try to avoid exposure to any allergens that you are sensitive to as much as possible. Other treatments for allergies, such as medicines and allergy shots (immunotherapy) are available.  °CAN I EXERCISE? °Follow your health care provider's advice regarding asthma treatment before exercising. It is important to maintain a regular exercise program, but vigorous exercise or exercise in cold, humid, or dry environments can cause asthma attacks, especially for those people who have exercise-induced asthma. °Document Released: 12/13/2008 Document Revised: 12/30/2012 Document Reviewed: 07/02/2012 °ExitCare® Patient  Information ©2015 [employer]. This information is not intended to replace advice given to you by your health care provider. Make sure you discuss any questions you have with your health care provider. ° °

## 2014-10-08 NOTE — ED Provider Notes (Signed)
CSN: [id number]     Arrival date & time 10/08/14  1478 History   First MD Initiated Contact with Patient 10/08/14 [phone]     Chief Complaint  Patient presents with  . Asthma   Rebecca Holmes is a 39 y.o. feamle with a history of asthma, heart murmur, HTN, and previous respiratory failure requiring ventilator support who presents to the emergency department complaining of an asthma exacerbation over the past 3 days and worse today with associated with cough and subjective fevers. She reports she has been using her albuterol nebulizer multiple times today without relief. She reports cough and pain in her chest with coughing, but no pain otherwise. She reports a history of intubation in 2010 for asthma. She reports last using prednisone 6 months ago. She is out of her albuterol MDI. The patient denies abdominal pain, nausea, vomiting, lightheadedness, loss of consciousness, leg pain, leg swelling, or rashes. Patient denies sick contacts.  (Consider location/radiation/quality/duration/timing/severity/associated sxs/prior Treatment) HPI  Past Medical History  Diagnosis Date  . Heart murmur   . Left ventricular dysfunction     Decreased; Low E 20-25% in the setting of severe illness requiring ICU care 06/2008; Cardiolyte 06/13/2009 normal perfusion w/o significant ischemia or scar; LVEF 38%  . Respiratory failure     Ventilator Dependent; 2/2 status asthmatics 6/11-18/2010 admit [facility]  . HTN (hypertension)     Try off ACE 06/13/2009 due to pseudoasthma  . Vision disorder   . Anemia, iron deficiency   . Asthma     vs Pseudoasthma from ACE; Trial off ACE > PFT's normal 09/09/09 x DLCO 59 > corrects to 73%  . Depression   . Insomnia    Past Surgical History  Procedure Laterality Date  . Dilation and curettage of uterus  1997   Family History  Problem Relation Age of Onset  . Diabetes Mother 71  . Cancer Mother     unknown   . Diabetes type II Brother 20  . Asthma Maternal Grandfather   . Asthma  Daughter    Social History  Substance Use Topics  . Smoking status: Current Every Day Smoker -- 0.50 packs/day for 19 years    Types: Cigarettes  . Smokeless tobacco: Never Used     Comment: PATIENT NOT READY  TO QUIT. 1/2 PPD  . Alcohol Use: 0.0 oz/week    0 Standard drinks or equivalent per week     Comment: occasionally   OB History    No data available     Review of Systems  Constitutional: Positive for fever (subjective ). Negative for chills.  HENT: Negative for congestion and sore throat.   Eyes: Negative for visual disturbance.  Respiratory: Positive for cough, chest tightness, shortness of breath and wheezing.   Cardiovascular: Negative for chest pain and palpitations.  Gastrointestinal: Negative for nausea, vomiting, abdominal pain and diarrhea.  Genitourinary: Negative for dysuria.  Musculoskeletal: Negative for back pain and neck pain.  Skin: Negative for rash.  Neurological: Negative for headaches.      Allergies  Peanut-containing drug products  Home Medications   Prior to Admission medications   Medication Sig Start Date End Date Taking? Authorizing Provider  albuterol (PROVENTIL HFA;VENTOLIN HFA) 108 (90 BASE) MCG/ACT inhaler Inhale 2 puffs into the lungs every 6 (six) hours as needed. For shortness of breath 10/13/13  Yes Ejiroghene E Emokpae, MD  albuterol (PROVENTIL) (2.5 MG/3ML) 0.083% nebulizer solution Take 3 mLs (2.5 mg total) by nebulization every 6 (six) hours as  needed for wheezing or shortness of breath. 10/13/13  Yes Ejiroghene E Emokpae, MD  DULoxetine (CYMBALTA) 60 MG capsule Take 1 capsule (60 mg total) by mouth every morning. 04/21/13  Yes Pasty Spillers McLean-Scocozza, MD  losartan (COZAAR) 25 MG tablet Take 1 tablet (25 mg total) by mouth daily. 10/13/13  Yes Ejiroghene Wendall Stade, MD  medroxyPROGESTERone (DEPO-PROVERA) 150 MG/ML injection Inject 150 mg into the muscle every 3 (three) months.   Yes Historical Provider, MD  QUEtiapine (SEROQUEL) 50 MG  tablet Take 1 tablet (50 mg total) by mouth at bedtime. 04/21/13  Yes Pasty Spillers McLean-Scocozza, MD  Fluticasone-Salmeterol (ADVAIR DISKUS) 250-50 MCG/DOSE AEPB INHALE 1 PUFF INTO THE LUNGS 2 TIMES DAILY. 10/08/14   Everlene Farrier, PA-C  methocarbamol (ROBAXIN) 500 MG tablet Take 2 tablets (1,000 mg total) by mouth every 6 (six) hours as needed for muscle spasms. Patient not taking: Reported on 10/08/2014 06/08/14   Ejiroghene Wendall Stade, MD  predniSONE (DELTASONE) 20 MG tablet Take 2 tablets (40 mg total) by mouth daily. 10/08/14   Everlene Farrier, PA-C   BP 118/94 mmHg  Pulse 98  Temp(Src) 98.5 F (36.9 C) (Oral)  Resp 21  SpO2 97% Physical Exam  Constitutional: She appears well-developed and well-nourished. No distress.  Nontoxic-appearing.  HENT:  Head: Normocephalic and atraumatic.  Mouth/Throat: Oropharynx is clear and moist.  Eyes: Conjunctivae are normal. Pupils are equal, round, and reactive to light. Right eye exhibits no discharge. Left eye exhibits no discharge.  Neck: Neck supple. No JVD present.  Cardiovascular: Regular rhythm, normal heart sounds and intact distal pulses.  Exam reveals no gallop and no friction rub.   No murmur heard. Tachycardic with a heart rate of 110.  Pulmonary/Chest: She is in respiratory distress. She has wheezes.  Initially the patient is tachypneic and in respiratory distress. Diffuse wheezing. Not able to speak in full sentences.   Abdominal: Soft. There is no tenderness.  Musculoskeletal: She exhibits no edema or tenderness.  No lower extremity edema or tenderness.  Lymphadenopathy:    She has no cervical adenopathy.  Neurological: She is alert. Coordination normal.  Skin: Skin is warm and dry. No rash noted. She is not diaphoretic. No erythema. No pallor.  Psychiatric: She has a normal mood and affect. Her behavior is normal.  Nursing note and vitals reviewed.   ED Course  Procedures (including critical care time) Labs Review Labs Reviewed -  No data to display  Imaging Review Dg Chest [facility] 1 View  10/08/2014   CLINICAL DATA:  Cough.  Wheezing.  Shortness of breath.  EXAM: PORTABLE CHEST - 1 VIEW  COMPARISON:  One-view chest x-ray 01/13/2013.  FINDINGS: The heart size is normal. Mild interstitial prominence is new. Mild perihilar prominence is evident bilaterally. No focal airspace consolidation is evident. There is no edema or effusion. The visualized soft tissues and bony thorax are unremarkable.  IMPRESSION: 1. Mild perihilar and interstitial prominence compatible with reactive airways disease. 2. No focal airspace disease.   Electronically Signed   By: Marin Roberts M.D.   On: 10/08/2014 09:56   I have personally reviewed and evaluated these images as part of my medical decision-making.   EKG Interpretation None      Filed Vitals:   10/08/14 0855 10/08/14 0907 10/08/14 1014 10/08/14 1128  BP: 140/104 140/104 120/78 118/94  Pulse: 79 103  98  Temp: 98.2 F (36.8 C)   98.5 F (36.9 C)  TempSrc: Oral   Oral  Resp: 24  22  21  SpO2: 93% 98% 96% 97%     MDM   Meds given in ED:  Medications  methylPREDNISolone sodium succinate (SOLU-MEDROL) 125 mg/2 mL injection 125 mg (125 mg Intravenous Given 10/08/14 0926)  albuterol (PROVENTIL,VENTOLIN) solution continuous neb (10 mg/hr Nebulization Given 10/08/14 0901)  ipratropium (ATROVENT) nebulizer solution 1 mg (1 mg Nebulization Given 10/08/14 0901)  magnesium sulfate IVPB 2 g 50 mL (0 g Intravenous Stopped 10/08/14 1049)    New Prescriptions   PREDNISONE (DELTASONE) 20 MG TABLET    Take 2 tablets (40 mg total) by mouth daily.    Final diagnoses:  Asthma exacerbation   This is a 39 y.o. feamle with a history of asthma, heart murmur, HTN, and previous respiratory failure requiring ventilator support who presents to the emergency department complaining of an asthma exacerbation over the past 3 days and worse today with associated with cough and subjective fevers. She  reports she has been using her albuterol nebulizer multiple times today without relief.   On initial evaluation the patient is tachypneic and in respiratory distress. There is diffuse wheezing bilaterally. The patient is unable to speak in complete sentences. Her oxygen saturation is 93% on room air. Patient was started on albuterol continuous nebulization, 1 mg of Atrovent and provided with Solu-Medrol and magnesium 2 g in the ED. With frequent rechecks the patient had gradual resolution of her symptoms. Chest x-ray indicated mild perihilar and interstitial prominence compatible with reactive airway disease and no focal air space disease. Prior to discharge the patient reports she is feeling back to normal. She denies any shortness of breath. The patient and related to the bathroom without feeling short of breath. Her repeat lung exam is much improved. She is not tachypneic, tachycardic or hypoxic. Her oxygen saturation is 98% on room air. She feels ready for discharge. She reports she is out of her Advair Diskus, however she has plenty of albuterol inhalers and nebulizer treatments available. I will refill her Advair Diskus and start her on a course of prednisone. I encouraged close follow-up by primary care and strict return precautions. I advised the patient to follow-up with their primary care provider this week. I advised the patient to return to the emergency department with new or worsening symptoms or new concerns. The patient verbalized understanding and agreement with plan.    This patient was discussed with and evaluated by Dr. Hyacinth Meeker who agrees with assessment and plan.    Everlene Farrier, PA-C 10/08/14 1145  Eber Hong, MD 10/08/14 [phone]

## 2014-10-08 NOTE — ED Provider Notes (Signed)
The patient is a 39 year old female with a history of asthma, has been out of her albuterol inhaler but has been using her nebulizer, over the last 4 days has had increased shortness of breath, not responding to the medications as she would like and now is in moderate to severe distress. She is having increased work of breathing, speaking in shortened sentences, has wheezing in all 4 lung fields, no peripheral edema or JVD. We'll obtain chest x-ray to rule out pneumonia, albuterol continuous treatment, steroid-induced, the patient does not want stay in the hospital as she has 5 children that she needs to care for at home. We'll gauge improvement based on ambulation on a pulse ox as well as clinical improvement with wheezing and oxygen saturations.  Medical screening examination/treatment/procedure(s) were conducted as a shared visit with non-physician practitioner(s) and myself.  I personally evaluated the patient during the encounter.  Clinical Impression:   Final diagnoses:  Asthma exacerbation         Eber Hong, MD 10/08/14 [phone]

## 2014-11-18 ENCOUNTER — Ambulatory Visit: Payer: Medicaid Other

## 2014-12-14 ENCOUNTER — Ambulatory Visit (INDEPENDENT_AMBULATORY_CARE_PROVIDER_SITE_OTHER): Payer: Medicaid Other | Admitting: *Deleted

## 2014-12-14 DIAGNOSIS — Z3009 Encounter for other general counseling and advice on contraception: Secondary | ICD-10-CM | POA: Diagnosis not present

## 2014-12-14 DIAGNOSIS — Z3042 Encounter for surveillance of injectable contraceptive: Secondary | ICD-10-CM

## 2014-12-14 DIAGNOSIS — Z308 Encounter for other contraceptive management: Secondary | ICD-10-CM | POA: Diagnosis not present

## 2014-12-14 LAB — POCT URINE PREGNANCY: Preg Test, Ur: NEGATIVE

## 2014-12-14 MED ORDER — MEDROXYPROGESTERONE ACETATE 150 MG/ML IM SUSP
150.0000 mg | INTRAMUSCULAR | Status: DC
  Administered 2014-12-14: 150 mg via INTRAMUSCULAR

## 2014-12-29 NOTE — Telephone Encounter (Signed)
error 

## 2014-12-31 ENCOUNTER — Encounter: Payer: Self-pay | Admitting: Internal Medicine

## 2014-12-31 ENCOUNTER — Ambulatory Visit (INDEPENDENT_AMBULATORY_CARE_PROVIDER_SITE_OTHER): Payer: Medicaid Other | Admitting: Internal Medicine

## 2014-12-31 VITALS — BP 116/82 | HR 83 | Ht 67.0 in | Wt 155.0 lb

## 2014-12-31 DIAGNOSIS — F17210 Nicotine dependence, cigarettes, uncomplicated: Secondary | ICD-10-CM

## 2014-12-31 DIAGNOSIS — J4530 Mild persistent asthma, uncomplicated: Secondary | ICD-10-CM | POA: Diagnosis not present

## 2014-12-31 DIAGNOSIS — Z720 Tobacco use: Secondary | ICD-10-CM | POA: Diagnosis not present

## 2014-12-31 MED ORDER — BUDESONIDE-FORMOTEROL FUMARATE 160-4.5 MCG/ACT IN AERO: INHALATION_SPRAY | RESPIRATORY_TRACT | Status: DC

## 2014-12-31 NOTE — Patient Instructions (Signed)
Plan A = automatic =  symbicort 160 Take 2 puffs first thing in am and then another 2 puffs about 12 hours later.   Plan B= backup - Only use your albuterol (proair) as a rescue medication to be used if you can't catch your breath by resting or doing a relaxed purse lip breathing pattern.  - The less you use it, the better it will work when you need it. - Ok to use up to 2 puffs  every 4 hours if you must but call for immediate appointment if use goes up over your usual need - Don't leave home without it !!  (think of it like the spare tire for your car)   Plan C= crisis - only use the nebulizer if you try to proair first and it doesn' work   Work on inhaler technique:  relax and gently blow all the way out then take a nice smooth deep breath back in, triggering the inhaler at same time you start breathing in.  Hold for up to 5 seconds if you can. Blow out thru nose. Rinse and gargle with water when done  The key is to stop smoking completely before smoking completely stops you!   Please schedule a follow up office visit in 6 weeks, call sooner if needed with pfts without any inhalers that day

## 2014-12-31 NOTE — Progress Notes (Signed)
Subjective:    Patient ID: Rebecca Holmes, female    DOB: 08/01/1975,   MRN: [id number]  HPI  [age] yobf active smoker with childhood asthma never completely resolved and required ET 2010 last seen in pulmonary clinic 2011 with nl pfts    12/31/2014 1st [facility] office visit/ Rebecca Holmes   Chief Complaint  Patient presents with  . Pulmonatry Consult    Former patient. She states that she is here b/c her PCP "won't refill my pumps"- has been out of proair and advair for approx 2 months. She c/o cough with clear sputum, SOB and wheezing since ran out of meds and has to use neb with albuterol 2 x daily on average.   worse x 2 m with freq er trips and daily / nightly saba use including neb sev hours prior to OV    No obvious  day to day or daytime variabilty or assoc excess/ purulent sputum or mucus plugs   or cp or chest tightness,  overt sinus or hb symptoms. No unusual exp hx or h/o childhood pna/ asthma or knowledge of premature birth.  Sleeping ok without nocturnal  or early am exacerbation  of respiratory  c/o's or need for noct saba. Also denies any obvious fluctuation of symptoms with weather or environmental changes or other aggravating or alleviating factors except as outlined above   Current Medications, Allergies, Complete Past Medical History, Past Surgical History, Family History, and Social History were reviewed in [facility] electronic medical record.            Review of Systems  Constitutional: Negative for fever, chills and unexpected weight change.  HENT: Negative for congestion, dental problem, ear pain, nosebleeds, postnasal drip, rhinorrhea, sinus pressure, sneezing, sore throat, trouble swallowing and voice change.   Eyes: Negative for visual disturbance.  Respiratory: Positive for cough and shortness of breath. Negative for choking.   Cardiovascular: Negative for chest pain and leg swelling.  Gastrointestinal: Negative for vomiting, abdominal pain and  diarrhea.  Genitourinary: Negative for difficulty urinating.  Musculoskeletal: Negative for arthralgias.  Skin: Negative for rash.  Neurological: Negative for tremors, syncope and headaches.  Hematological: Does not bruise/bleed easily.       Objective:   Physical Exam  amb bf nad   Wt Readings from Last 3 Encounters:  12/31/14 155 lb (70.308 kg)  06/08/14 164 lb 4.8 oz (74.526 kg)  10/13/13 172 lb 12.8 oz (78.382 kg)    Vital signs reviewed   HEENT: nl dentition, turbinates, and oropharynx. Nl external ear canals without cough reflex   NECK :  without JVD/Nodes/TM/ nl carotid upstrokes bilaterally   LUNGS: no acc muscle use,  Nl contour chest which is clear to A and P bilaterally without cough on insp or exp maneuvers   CV:  RRR  no s3 or murmur or increase in P2, no edema   ABD:  soft and nontender with nl inspiratory excursion in the supine position. No bruits or organomegaly, bowel sounds nl  MS:  Nl gait/ ext warm without deformities, calf tenderness, cyanosis or clubbing No obvious joint restrictions   SKIN: warm and dry without lesions    NEURO:  alert, approp, nl sensorium with  no motor deficits     I personally reviewed images and agree with radiology impression as follows:  CXR:  10/08/14  1. Mild perihilar and interstitial prominence compatible with reactive airways disease. 2. No focal airspace disease.      Assessment &  Plan:

## 2015-01-01 NOTE — Assessment & Plan Note (Signed)
It turns out that about 20% of patients who end up in status asthmaticus  Have normal PFTs at baseline and  I suspect that is the case here. That is, her asthma is not that bad but her attention to detail in terms of taking care of it is so poor that she can evolve to severe asthma Presently she is badly breaking the rule of twos and needs to get back on a controlling drug on a regular basis with follow-up year with PFTs.  - The proper method of use, as well as anticipated side effects, of a metered-dose inhaler are discussed and demonstrated to the patient. Improved effectiveness after extensive coaching during this visit to a level of approximately 75 % from a baseline of 50 %   - needs to stop smoking now  (see separate a/p)   I had an extended discussion with the patient reviewing all relevant studies completed to date and  lasting 35 minutes of a 60 minute visit    Each maintenance medication was reviewed in detail including most importantly the difference between maintenance and prns and under what circumstances the prns are to be triggered using an action plan format that is not reflected in the computer generated alphabetically organized AVS.    Please see instructions for details which were reviewed in writing and the patient given a copy highlighting the part that I personally wrote and discussed at today's ov.

## 2015-01-01 NOTE — Assessment & Plan Note (Signed)

## 2015-01-22 ENCOUNTER — Other Ambulatory Visit: Payer: Self-pay | Admitting: Internal Medicine

## 2015-01-25 NOTE — Telephone Encounter (Signed)
Pt hasn't been seen since march- will attempt to get her in

## 2015-02-09 ENCOUNTER — Ambulatory Visit: Payer: Medicaid Other | Admitting: Internal Medicine

## 2015-02-09 ENCOUNTER — Encounter: Payer: Self-pay | Admitting: Internal Medicine

## 2015-02-10 ENCOUNTER — Other Ambulatory Visit: Payer: Self-pay | Admitting: Internal Medicine

## 2015-02-10 DIAGNOSIS — R0600 Dyspnea, unspecified: Secondary | ICD-10-CM

## 2015-02-11 ENCOUNTER — Ambulatory Visit: Payer: Medicaid Other | Admitting: Internal Medicine

## 2015-02-11 ENCOUNTER — Ambulatory Visit (INDEPENDENT_AMBULATORY_CARE_PROVIDER_SITE_OTHER): Payer: Medicaid Other | Admitting: Internal Medicine

## 2015-02-11 DIAGNOSIS — R0600 Dyspnea, unspecified: Secondary | ICD-10-CM | POA: Diagnosis not present

## 2015-02-11 LAB — PULMONARY FUNCTION TEST
DL/VA % PRED: 69 %
DL/VA: 3.6 ml/min/mmHg/L
DLCO UNC % PRED: 52 %
DLCO UNC: 14.75 ml/min/mmHg
FEF 25-75 POST: 1.64 L/s
FEF 25-75 PRE: 0.75 L/s
FEF2575-%CHANGE-POST: 117 %
FEF2575-%PRED-POST: 53 %
FEF2575-%PRED-PRE: 24 %
FEV1-%CHANGE-POST: 37 %
FEV1-%Pred-Post: 76 %
FEV1-%Pred-Pre: 55 %
FEV1-POST: 2.16 L
FEV1-Pre: 1.57 L
FEV1FVC-%CHANGE-POST: 23 %
FEV1FVC-%PRED-PRE: 63 %
FEV6-%CHANGE-POST: 14 %
FEV6-%PRED-POST: 96 %
FEV6-%Pred-Pre: 84 %
FEV6-PRE: 2.84 L
FEV6-Post: 3.25 L
FEV6FVC-%CHANGE-POST: 1 %
FEV6FVC-%PRED-PRE: 100 %
FEV6FVC-%Pred-Post: 101 %
FVC-%CHANGE-POST: 11 %
FVC-%PRED-POST: 94 %
FVC-%Pred-Pre: 85 %
FVC-Post: 3.26 L
FVC-Pre: 2.93 L
POST FEV1/FVC RATIO: 66 %
Post FEV6/FVC ratio: 100 %
Pre FEV1/FVC ratio: 54 %
Pre FEV6/FVC Ratio: 99 %
RV % pred: 173 %
RV: 2.99 L
TLC % pred: 106 %
TLC: 5.86 L

## 2015-02-11 NOTE — Progress Notes (Signed)
PFT done today. 

## 2015-02-21 ENCOUNTER — Emergency Department (HOSPITAL_COMMUNITY)
Admission: EM | Admit: 2015-02-21 | Discharge: 2015-02-21 | Disposition: A | Discharge status: Home / Self Care | Payer: Medicaid Other | Attending: Emergency Medicine | Admitting: Emergency Medicine

## 2015-02-21 ENCOUNTER — Encounter (HOSPITAL_COMMUNITY): Payer: Self-pay | Admitting: Emergency Medicine

## 2015-02-21 DIAGNOSIS — Y998 Other external cause status: Secondary | ICD-10-CM | POA: Diagnosis not present

## 2015-02-21 DIAGNOSIS — I10 Essential (primary) hypertension: Secondary | ICD-10-CM | POA: Insufficient documentation

## 2015-02-21 DIAGNOSIS — T7840XA Allergy, unspecified, initial encounter: Secondary | ICD-10-CM | POA: Diagnosis present

## 2015-02-21 DIAGNOSIS — F17210 Nicotine dependence, cigarettes, uncomplicated: Secondary | ICD-10-CM | POA: Insufficient documentation

## 2015-02-21 DIAGNOSIS — F329 Major depressive disorder, single episode, unspecified: Secondary | ICD-10-CM | POA: Insufficient documentation

## 2015-02-21 DIAGNOSIS — Z862 Personal history of diseases of the blood and blood-forming organs and certain disorders involving the immune mechanism: Secondary | ICD-10-CM | POA: Insufficient documentation

## 2015-02-21 DIAGNOSIS — L299 Pruritus, unspecified: Secondary | ICD-10-CM | POA: Insufficient documentation

## 2015-02-21 DIAGNOSIS — J45909 Unspecified asthma, uncomplicated: Secondary | ICD-10-CM | POA: Diagnosis not present

## 2015-02-21 DIAGNOSIS — X58XXXA Exposure to other specified factors, initial encounter: Secondary | ICD-10-CM | POA: Diagnosis not present

## 2015-02-21 DIAGNOSIS — R220 Localized swelling, mass and lump, head: Secondary | ICD-10-CM | POA: Insufficient documentation

## 2015-02-21 DIAGNOSIS — Z79899 Other long term (current) drug therapy: Secondary | ICD-10-CM | POA: Insufficient documentation

## 2015-02-21 DIAGNOSIS — T781XXA Other adverse food reactions, not elsewhere classified, initial encounter: Secondary | ICD-10-CM | POA: Insufficient documentation

## 2015-02-21 DIAGNOSIS — G4700 Insomnia, unspecified: Secondary | ICD-10-CM | POA: Insufficient documentation

## 2015-02-21 DIAGNOSIS — R011 Cardiac murmur, unspecified: Secondary | ICD-10-CM | POA: Insufficient documentation

## 2015-02-21 DIAGNOSIS — Y9389 Activity, other specified: Secondary | ICD-10-CM | POA: Diagnosis not present

## 2015-02-21 DIAGNOSIS — Y9289 Other specified places as the place of occurrence of the external cause: Secondary | ICD-10-CM | POA: Diagnosis not present

## 2015-02-21 LAB — CBC WITH DIFFERENTIAL/PLATELET
BASOS ABS: 0 10*3/uL (ref 0.0–0.1)
Basophils Relative: 0 %
Eosinophils Absolute: 0.1 10*3/uL (ref 0.0–0.7)
Eosinophils Relative: 1 %
HEMATOCRIT: 34.2 % — AB (ref 36.0–46.0)
Hemoglobin: 11.7 g/dL — ABNORMAL LOW (ref 12.0–15.0)
LYMPHS ABS: 2.5 10*3/uL (ref 0.7–4.0)
LYMPHS PCT: 30 %
MCH: 28.3 pg (ref 26.0–34.0)
MCHC: 34.2 g/dL (ref 30.0–36.0)
MCV: 82.8 fL (ref 78.0–100.0)
MONO ABS: 0.6 10*3/uL (ref 0.1–1.0)
Monocytes Relative: 7 %
NEUTROS ABS: 5.2 10*3/uL (ref 1.7–7.7)
Neutrophils Relative %: 62 %
Platelets: 211 10*3/uL (ref 150–400)
RBC: 4.13 MIL/uL (ref 3.87–5.11)
RDW: 16.4 % — ABNORMAL HIGH (ref 11.5–15.5)
WBC: 8.4 10*3/uL (ref 4.0–10.5)

## 2015-02-21 LAB — BASIC METABOLIC PANEL
ANION GAP: 8 (ref 5–15)
BUN: 16 mg/dL (ref 6–20)
CO2: 19 mmol/L — AB (ref 22–32)
Calcium: 8.8 mg/dL — ABNORMAL LOW (ref 8.9–10.3)
Chloride: 106 mmol/L (ref 101–111)
Creatinine, Ser: 1.18 mg/dL — ABNORMAL HIGH (ref 0.44–1.00)
GFR calc Af Amer: >60 mL/min (ref >60)
GFR calc non Af Amer: 57 mL/min — ABNORMAL LOW (ref >60)
GLUCOSE: 81 mg/dL (ref 65–99)
POTASSIUM: 4.5 mmol/L (ref 3.5–5.1)
Sodium: 133 mmol/L — ABNORMAL LOW (ref 135–145)

## 2015-02-21 MED ORDER — FAMOTIDINE IN NACL 20-0.9 MG/50ML-% IV SOLN
20.0000 mg | Freq: Once | INTRAVENOUS | Status: AC
  Administered 2015-02-21: 20 mg via INTRAVENOUS
  Filled 2015-02-21: qty 50

## 2015-02-21 MED ORDER — METHYLPREDNISOLONE SODIUM SUCC 125 MG IJ SOLR
125.0000 mg | Freq: Once | INTRAMUSCULAR | Status: AC
  Administered 2015-02-21: 125 mg via INTRAVENOUS
  Filled 2015-02-21: qty 2

## 2015-02-21 MED ORDER — DIPHENHYDRAMINE HCL 50 MG/ML IJ SOLN
25.0000 mg | Freq: Once | INTRAMUSCULAR | Status: AC
  Administered 2015-02-21: 25 mg via INTRAVENOUS
  Filled 2015-02-21: qty 1

## 2015-02-21 MED ORDER — DIPHENHYDRAMINE HCL 25 MG PO TABS: 25.0000 mg | ORAL_TABLET | Freq: Four times a day (QID) | ORAL | Status: AC

## 2015-02-21 MED ORDER — LORATADINE 10 MG PO TABS: 10.0000 mg | ORAL_TABLET | Freq: Every day | ORAL | Status: AC

## 2015-02-21 MED ORDER — SODIUM CHLORIDE 0.9 % IV BOLUS (SEPSIS)
1000.0000 mL | Freq: Once | INTRAVENOUS | Status: AC
  Administered 2015-02-21: 1000 mL via INTRAVENOUS

## 2015-02-21 MED ORDER — FAMOTIDINE 20 MG PO TABS: 20.0000 mg | ORAL_TABLET | Freq: Every day | ORAL | Status: DC

## 2015-02-21 MED ORDER — PREDNISONE 10 MG PO TABS: 20.0000 mg | ORAL_TABLET | Freq: Every day | ORAL | Status: DC

## 2015-02-21 NOTE — ED Provider Notes (Signed)
CSN: [id number]     Arrival date & time 02/21/15  0027 History   First MD Initiated Contact with Patient 02/21/15 0047     Chief Complaint  Patient presents with  . Allergic Reaction     (Consider location/radiation/quality/duration/timing/severity/associated sxs/prior Treatment) HPI   Patient to the ER with complaints of allergic reaction. She is allergic to peanuts and had a handful of Almonds around 11: 30 pm, 30 minutes later she started to have eyelid swelling and itching all over, no urticaria or hives. She is not having any SOB, lip, tongue, throat swelling or SOB. She has an epi pen at home but did not use it because she reports being unsure of what was happening. She is able to talk in full sentences and does not appear distressed. Normal vitals signs.  PCP: Kennis Carina, MD  ROS: The patient denies diaphoresis, fever, headache, weakness (general or focal), confusion, change of vision,  dysphagia, aphagia, shortness of breath,  abdominal pains, nausea, vomiting, diarrhea, lower extremity swelling, rash, neck pain, chest pain   Past Medical History  Diagnosis Date  . Heart murmur   . Left ventricular dysfunction     Decreased; Low E 20-25% in the setting of severe illness requiring ICU care 06/2008; Cardiolyte 06/13/2009 normal perfusion w/o significant ischemia or scar; LVEF 38%  . Respiratory failure (HCC)     Ventilator Dependent; 2/2 status asthmatics 6/11-18/2010 admit [facility]  . HTN (hypertension)     Try off ACE 06/13/2009 due to pseudoasthma  . Vision disorder   . Anemia, iron deficiency   . Asthma     vs Pseudoasthma from ACE; Trial off ACE > PFT's normal 09/09/09 x DLCO 59 > corrects to 73%  . Depression   . Insomnia    Past Surgical History  Procedure Laterality Date  . Dilation and curettage of uterus  1997   Family History  Problem Relation Age of Onset  . Diabetes Mother 1  . Cancer Mother     unknown   . Diabetes type II Brother 20  . Asthma Maternal  Grandfather   . Asthma Daughter    Social History  Substance Use Topics  . Smoking status: Current Every Day Smoker -- 0.50 packs/day for 25 years    Types: Cigarettes  . Smokeless tobacco: Never Used  . Alcohol Use: 0.0 oz/week    0 Standard drinks or equivalent per week     Comment: occasionally   OB History    No data available     Review of Systems  Review of Systems All other systems negative except as documented in the HPI. All pertinent positives and negatives as reviewed in the HPI.   Allergies  Peanut-containing drug products  Home Medications   Prior to Admission medications   Medication Sig Start Date End Date Taking? Authorizing Provider  albuterol (PROVENTIL) (2.5 MG/3ML) 0.083% nebulizer solution Take 3 mLs (2.5 mg total) by nebulization every 6 (six) hours as needed for wheezing or shortness of breath. 10/13/13  Yes Ejiroghene E Emokpae, MD  ARIPiprazole (ABILIFY) 10 MG tablet Take 10 mg by mouth daily.   Yes Historical Provider, MD  budesonide-formoterol (SYMBICORT) 160-4.5 MCG/ACT inhaler Take 2 puffs first thing in am and then another 2 puffs about 12 hours later. 12/31/14  Yes Nyoka Cowden, MD  DULoxetine (CYMBALTA) 60 MG capsule Take 1 capsule (60 mg total) by mouth every morning. 04/21/13  Yes Pasty Spillers McLean-Scocozza, MD  EPIPEN 2-PAK 0.3 MG/0.3ML SOAJ injection  Inject 1 Device into the muscle daily as needed. For nut allergy 12/29/14  Yes Historical Provider, MD  losartan (COZAAR) 25 MG tablet Take 1 tablet (25 mg total) by mouth daily. 10/13/13  Yes Ejiroghene Wendall Stade, MD  PROAIR HFA 108 (90 Base) MCG/ACT inhaler INHALE 2 PUFFS INTO THE LUNGS EVERY 6 (SIX) HOURS AS NEEDED. FOR SHORTNESS OF BREATH 01/26/15  Yes Ejiroghene Wendall Stade, MD  Specialty Vitamins Products ([employer] HAIR/SKIN/NAILS PO) Take 1 tablet by mouth 3 (three) times daily.   Yes Historical Provider, MD  zolpidem (AMBIEN) 10 MG tablet Take 10 mg by mouth at bedtime. 12/29/14  Yes Historical  Provider, MD  diphenhydrAMINE (BENADRYL) 25 MG tablet Take 1 tablet (25 mg total) by mouth every 6 (six) hours. 02/21/15   Quavis Klutz Neva Seat, PA-C  famotidine (PEPCID) 20 MG tablet Take 1 tablet (20 mg total) by mouth daily. 02/21/15   Anzal Bartnick Neva Seat, PA-C  loratadine (CLARITIN) 10 MG tablet Take 1 tablet (10 mg total) by mouth daily. 02/21/15   Charlotte Fidalgo Neva Seat, PA-C  predniSONE (DELTASONE) 10 MG tablet Take 2 tablets (20 mg total) by mouth daily. 02/21/15   Khyree Carillo Neva Seat, PA-C   BP 119/97 mmHg  Pulse 83  Temp(Src) 97.7 F (36.5 C) (Oral)  Resp 18  SpO2 100% Physical Exam  Constitutional: She appears well-developed and well-nourished. No distress.  HENT:  Head: Normocephalic and atraumatic.  Right Ear: Tympanic membrane and ear canal normal.  Left Ear: Tympanic membrane and ear canal normal.  Nose: Nose normal.  Mouth/Throat: Uvula is midline, oropharynx is clear and moist and mucous membranes are normal. No trismus in the jaw. No uvula swelling or lacerations. No posterior oropharyngeal edema or posterior oropharyngeal erythema.  Eyes: Conjunctivae and EOM are normal. Pupils are equal, round, and reactive to light.  Bilateral periorbital edema.  Neck: Normal range of motion. Neck supple.  Cardiovascular: Normal rate and regular rhythm.   Pulmonary/Chest: Effort normal.  Abdominal: Soft.  No signs of abdominal distention  Musculoskeletal:  No LE swelling  Neurological: She is alert.  Acting at baseline  Skin: Skin is warm and dry. No rash noted.  Nursing note and vitals reviewed.   ED Course  Procedures (including critical care time) Labs Review Labs Reviewed  CBC WITH DIFFERENTIAL/PLATELET - Abnormal; Notable for the following:    Hemoglobin 11.7 (*)    HCT 34.2 (*)    RDW 16.4 (*)    All other components within normal limits  BASIC METABOLIC PANEL - Abnormal; Notable for the following:    Sodium 133 (*)    CO2 19 (*)    Creatinine, Ser 1.18 (*)    Calcium 8.8 (*)    GFR  calc non Af Amer 57 (*)    All other components within normal limits    Imaging Review No results found. I have personally reviewed and evaluated these images and lab results as part of my medical decision-making.   EKG Interpretation None      MDM   Final diagnoses:  Allergic reaction, initial encounter    Medications  sodium chloride 0.9 % bolus 1,000 mL (1,000 mLs Intravenous New Bag/Given 02/21/15 0058)  famotidine (PEPCID) IVPB 20 mg premix (20 mg Intravenous New Bag/Given 02/21/15 0058)  methylPREDNISolone sodium succinate (SOLU-MEDROL) 125 mg/2 mL injection 125 mg (125 mg Intravenous Given 02/21/15 0058)  diphenhydrAMINE (BENADRYL) injection 25 mg (25 mg Intravenous Given 02/21/15 0058)    1:00 am - The patient is not in any distress at  this time. No airway or oropharyngeal involvement normal vital signs. Blood pressure 126/94, pulse 89, temperature 97.7 F (36.5 C), temperature source Oral, resp. rate 19, SpO2 97 %.  3: 30 am - Patient has complete resolution of symptoms. She continues to have no SOB, rash, angio edema, intra oral swelling or peri orbital swelling. She requests discharge. She has been monitored in the ED for 3 hours and will be dc'd with prednisone taper, benadryl, pepcid and claritin. She has a in-date epi pen at home and we discussed its use. We also discussed return precautions.     Marlon Pel, PA-C 02/21/15 0330  Devoria Albe, MD 02/21/15 [phone]

## 2015-02-21 NOTE — ED Notes (Signed)
Pt reports to ED from home for facial swelling and itching; pt has hx of peanut allergy and ate a handful of almonds at 23:30; pt states she has not had a reaction to almonds in the past; pt actively scratching body in triage; pt's eyes appear swollen; pt denies SOB

## 2015-02-21 NOTE — Discharge Instructions (Signed)
Angioedema Angioedema is a sudden swelling of tissues, often of the skin. It can occur on the face or genitals or in the abdomen or other body parts. The swelling usually develops over a short period and gets better in 24 to 48 hours. It often begins during the night and is found when the person wakes up. The person may also get red, itchy patches of skin (hives). Angioedema can be dangerous if it involves swelling of the air passages.  Depending on the cause, episodes of angioedema may only happen once, come back in unpredictable patterns, or repeat for several years and then gradually fade away.  CAUSES  Angioedema can be caused by an allergic reaction to various triggers. It can also result from nonallergic causes, including reactions to drugs, immune system disorders, viral infections, or an abnormal gene that is passed to you from your parents (hereditary). For some people with angioedema, the cause is unknown.  Some things that can trigger angioedema iAllergies An allergy is an abnormal reaction to a substance by the body's defense system (immune system). Allergies can develop at any age. WHAT CAUSES ALLERGIES? An allergic reaction happens when the immune system mistakenly reacts to a normally harmless substance, called an allergen, as if it were harmful. The immune system releases antibodies to fight the substance. Antibodies eventually release a chemical called histamine into the bloodstream. The release of histamine is meant to protect the body from infection, but it also causes discomfort. An allergic reaction can be triggered by:  Eating an allergen.  Inhaling an allergen.  Touching an allergen. WHAT TYPES OF ALLERGIES ARE THERE? There are many types of allergies. Common types include:  Seasonal allergies. People with this type of allergy are usually allergic to substances that are only present during certain seasons, such as molds and pollens.  Food allergies.  Drug  allergies.  Insect allergies.  Animal dander allergies. WHAT ARE SYMPTOMS OF ALLERGIES? Possible allergy symptoms include:  Swelling of the lips, face, tongue, mouth, or throat.  Sneezing, coughing, or wheezing.  Nasal congestion.  Tingling in the mouth.  Rash.  Itching.  Itchy, red, swollen areas of skin (hives).  Watery eyes.  Vomiting.  Diarrhea.  Dizziness.  Lightheadedness.  Fainting.  Trouble breathing or swallowing.  Chest tightness.  Rapid heartbeat. HOW ARE ALLERGIES DIAGNOSED? Allergies are diagnosed with a medical and family history and one or more of the following:  Skin tests.  Blood tests.  A food diary. A food diary is a record of all the foods and drinks you have in a day and of all the symptoms you experience.  The results of an elimination diet. An elimination diet involves eliminating foods from your diet and then adding them back in one by one to find out if a certain food causes an allergic reaction. HOW ARE ALLERGIES TREATED? There is no cure for allergies, but allergic reactions can be treated with medicine. Severe reactions usually need to be treated at a hospital. HOW CAN REACTIONS BE PREVENTED? The best way to prevent an allergic reaction is by avoiding the substance you are allergic to. Allergy shots and medicines can also help prevent reactions in some cases. People with severe allergic reactions may be able to prevent a life-threatening reaction called anaphylaxis with a medicine given right after exposure to the allergen.   This information is not intended to replace advice given to you by your health care provider. Make sure you discuss any questions you have with your health  care provider.   Document Released: 03/20/2002 Document Revised: 01/15/2014 Document Reviewed: 10/06/2013 Elsevier Interactive Patient Education 2016 [employer]. clude:   Foods.   Medicines, such as ACE inhibitors, ARBs, nonsteroidal  anti-inflammatory agents, or estrogen.   Latex.   Animal saliva.   Insect stings.   Dyes used in X-rays.   Mild injury.   Dental work.  Surgery.  Stress.   Sudden changes in temperature.   Exercise. SIGNS AND SYMPTOMS   Swelling of the skin.  Hives. If these are present, there is also intense itching.  Redness in the affected area.   Pain in the affected area.  Swollen lips or tongue.  Breathing problems. This may happen if the air passages swell.  Wheezing. If internal organs are involved, there may be:   Nausea.   Abdominal pain.   Vomiting.   Difficulty swallowing.   Difficulty passing urine. DIAGNOSIS   Your health care provider will examine the affected area and take a medical and family history.  Various tests may be done to help determine the cause. Tests may include:  Allergy skin tests to see if the problem is an allergic reaction.   Blood tests to check for hereditary angioedema.   Tests to check for underlying diseases that could cause the condition.   A review of your medicines, including over-the-counter medicines, may be done. TREATMENT  Treatment will depend on the cause of the angioedema. Possible treatments include:   Removal of anything that triggered the condition (such as stopping certain medicines).   Medicines to treat symptoms or prevent attacks. Medicines given may include:   Antihistamines.   Epinephrine injection.   Steroids.   Hospitalization may be required for severe attacks. If the air passages are affected, it can be an emergency. Tubes may need to be placed to keep the airway open. HOME CARE INSTRUCTIONS   Take all medicines as directed by your health care provider.  If you were given medicines for emergency allergy treatment, always carry them with you.  Wear a medical bracelet as directed by your health care provider.   Avoid known triggers. SEEK MEDICAL CARE IF:   You have  repeat attacks of angioedema.   Your attacks are more frequent or more severe despite preventive measures.   You have hereditary angioedema and are considering having children. It is important to discuss with your health care provider the risks of passing the condition on to your children. SEEK IMMEDIATE MEDICAL CARE IF:   You have severe swelling of the mouth, tongue, or lips.  You have difficulty breathing.   You have difficulty swallowing.   You faint. MAKE SURE YOU:  Understand these instructions.  Will watch your condition.  Will get help right away if you are not doing well or get worse.   This information is not intended to replace advice given to you by your health care provider. Make sure you discuss any questions you have with your health care provider.   Document Released: 03/05/2001 Document Revised: 01/15/2014 Document Reviewed: 08/18/2012 Elsevier Interactive Patient Education [employer]

## 2015-03-08 ENCOUNTER — Ambulatory Visit (INDEPENDENT_AMBULATORY_CARE_PROVIDER_SITE_OTHER): Payer: Medicaid Other | Admitting: Internal Medicine

## 2015-03-08 ENCOUNTER — Encounter: Payer: Self-pay | Admitting: Internal Medicine

## 2015-03-08 VITALS — BP 106/74 | HR 94 | Ht 67.0 in | Wt 161.4 lb

## 2015-03-08 DIAGNOSIS — J4530 Mild persistent asthma, uncomplicated: Secondary | ICD-10-CM

## 2015-03-08 DIAGNOSIS — F17210 Nicotine dependence, cigarettes, uncomplicated: Secondary | ICD-10-CM | POA: Diagnosis not present

## 2015-03-08 NOTE — Progress Notes (Signed)
Subjective:    Patient ID: Rebecca Holmes, female    DOB: [birth year]    MRN: [id number]    Brief patient profile:  69 yobf active smoker with childhood asthma never completely resolved and required ET 2010 last seen in pulmonary clinic 2011 with nl pfts    12/31/2014 1st [facility] office visit/ Rebecca Holmes   Chief Complaint  Patient presents with  . Pulmonatry Consult    Former patient. She states that she is here b/c her PCP "won't refill my pumps"- has been out of proair and advair for approx 2 months. She c/o cough with clear sputum, SOB and wheezing since ran out of meds and has to use neb with albuterol 2 x daily on average.   worse x 2 m with freq er trips and daily / nightly saba use including neb sev hours prior to OV   rec Plan A = automatic =  symbicort 160 Take 2 puffs first thing in am and then another 2 puffs about 12 hours later.  Plan B= backup - Only use your albuterol (proair)  Plan C= crisis - only use the nebulizer if you try to proair first and it doesn' work    03/08/2015  f/u ov/Rebecca Holmes re: mild persistent asthma/ still smoking  maint rx with symb 160 2bid/ still smoking   Chief Complaint  Patient presents with  . Follow-up    Pt states her breathing has been better since last visit. She c/o chest and nasal congestion for the past 2 days. She has minimal cough- non prod.   only needed proair 4 x since last ov On prednisone now for almond reaction with eye swelling but no flare of rhinitis or cough/ wheeze/sob  No obvious day to day or daytime variability or assoc chronic cough or cp or chest tightness, subjective wheeze or overt sinus or hb symptoms. No unusual exp hx or h/o childhood pna/ asthma or knowledge of premature birth.  Sleeping ok without nocturnal  or early am exacerbation  of respiratory  c/o's or need for noct saba. Also denies any obvious fluctuation of symptoms with weather or environmental changes or other aggravating or alleviating factors  except as outlined above   Current Medications, Allergies, Complete Past Medical History, Past Surgical History, Family History, and Social History were reviewed in [employer] record.  ROS  The following are not active complaints unless bolded sore throat, dysphagia, dental problems, itching, sneezing,  nasal congestion or excess/ purulent secretions, ear ache,   fever, chills, sweats, unintended wt loss, classically pleuritic or exertional cp, hemoptysis,  orthopnea pnd or leg swelling, presyncope, palpitations, abdominal pain, anorexia, nausea, vomiting, diarrhea  or change in bowel or bladder habits, change in stools or urine, dysuria,hematuria,  rash, arthralgias, visual complaints, headache, numbness, weakness or ataxia or problems with walking or coordination,  change in mood/affect or memory.                             Objective:   Physical Exam  amb bf nad   03/08/2015        161   12/31/14 155 lb (70.308 kg)  06/08/14 164 lb 4.8 oz (74.526 kg)  10/13/13 172 lb 12.8 oz (78.382 kg)    Vital signs reviewed   HEENT: nl dentition, turbinates, and oropharynx. Nl external ear canals without cough reflex   NECK :  without JVD/Nodes/TM/ nl carotid upstrokes bilaterally  LUNGS: no acc muscle use,  Nl contour chest which is clear to A and P bilaterally without cough on insp or exp maneuvers   CV:  RRR  no s3 or murmur or increase in P2, no edema   ABD:  soft and nontender with nl inspiratory excursion in the supine position. No bruits or organomegaly, bowel sounds nl  MS:  Nl gait/ ext warm without deformities, calf tenderness, cyanosis or clubbing No obvious joint restrictions   SKIN: warm and dry without lesions    NEURO:  alert, approp, nl sensorium with  no motor deficits     I personally reviewed images and agree with radiology impression as follows:  CXR:  10/08/14  1. Mild perihilar and interstitial prominence compatible  with reactive airways disease. 2. No focal airspace disease.      Assessment & Plan:

## 2015-03-08 NOTE — Patient Instructions (Addendum)
No change in recommendations  Work on inhaler technique:  relax and gently blow all the way out then take a nice smooth deep breath back in, triggering the inhaler at same time you start breathing in.  Hold for up to 5 seconds if you can. Blow out thru nose. Rinse and gargle with water when done   The key is to stop smoking completely before smoking completely stops you!   Please schedule a follow up office visit in 6 weeks, call sooner if needed with pfts on return (ok to push back)

## 2015-03-09 NOTE — Assessment & Plan Note (Addendum)
She has reasonably good control despite smoking AGAINST MEDICAL ADVICE on  symbicort 160  2 puffs every 12 hours  - The proper method of use, as well as anticipated side effects, of a metered-dose inhaler are discussed and demonstrated to the patient. Improved effectiveness after extensive coaching during this visit to a level of approximately 75 % from a baseline of 50 %  So needs to continue to work to inhale her medications more effectively and her cigarettes less often (see separate a/p)   I had an extended discussion with the patient reviewing all relevant studies completed to date and  lasting 15 to 20 minutes of a 25 minute visit    Each maintenance medication was reviewed in detail including most importantly the difference between maintenance and prns and under what circumstances the prns are to be triggered using an action plan format that is not reflected in the computer generated alphabetically organized AVS.    Please see instructions for details which were reviewed in writing and the patient given a copy highlighting the part that I personally wrote and discussed at today's ov.

## 2015-03-09 NOTE — Assessment & Plan Note (Signed)
>   3 min   Discussed the risks and costs (both direct and indirect)  of smoking relative to the benefits of quitting but patient unwilling to commit at this point to a specific quit date.    Offered to help with quitting by use of chantix or referral to our Quit Smart program when the patient is ready.  

## 2015-03-16 ENCOUNTER — Ambulatory Visit (INDEPENDENT_AMBULATORY_CARE_PROVIDER_SITE_OTHER): Payer: Medicaid Other | Admitting: *Deleted

## 2015-03-16 DIAGNOSIS — Z308 Encounter for other contraceptive management: Secondary | ICD-10-CM

## 2015-03-16 DIAGNOSIS — Z3042 Encounter for surveillance of injectable contraceptive: Secondary | ICD-10-CM

## 2015-03-16 MED ORDER — MEDROXYPROGESTERONE ACETATE 150 MG/ML IM SUSP
150.0000 mg | INTRAMUSCULAR | Status: DC
  Administered 2015-03-16: 150 mg via INTRAMUSCULAR

## 2015-03-21 ENCOUNTER — Ambulatory Visit: Payer: Medicaid Other | Admitting: Internal Medicine

## 2015-03-22 ENCOUNTER — Encounter: Payer: Self-pay | Admitting: Internal Medicine

## 2015-03-22 ENCOUNTER — Ambulatory Visit (INDEPENDENT_AMBULATORY_CARE_PROVIDER_SITE_OTHER): Payer: Medicaid Other | Admitting: Internal Medicine

## 2015-03-22 VITALS — BP 126/90 | HR 88 | Temp 98.8°F | Ht 67.0 in | Wt 169.4 lb

## 2015-03-22 DIAGNOSIS — Z79899 Other long term (current) drug therapy: Secondary | ICD-10-CM

## 2015-03-22 DIAGNOSIS — G8929 Other chronic pain: Secondary | ICD-10-CM

## 2015-03-22 DIAGNOSIS — F32A Depression, unspecified: Secondary | ICD-10-CM

## 2015-03-22 DIAGNOSIS — F329 Major depressive disorder, single episode, unspecified: Secondary | ICD-10-CM | POA: Diagnosis not present

## 2015-03-22 DIAGNOSIS — I10 Essential (primary) hypertension: Secondary | ICD-10-CM | POA: Diagnosis not present

## 2015-03-22 DIAGNOSIS — M545 Low back pain: Secondary | ICD-10-CM | POA: Diagnosis not present

## 2015-03-22 DIAGNOSIS — M5441 Lumbago with sciatica, right side: Secondary | ICD-10-CM

## 2015-03-22 DIAGNOSIS — M549 Dorsalgia, unspecified: Principal | ICD-10-CM

## 2015-03-22 MED ORDER — DICLOFENAC SODIUM 1 % TD GEL: 2.0000 g | Freq: Four times a day (QID) | TRANSDERMAL | Status: AC

## 2015-03-22 NOTE — Assessment & Plan Note (Signed)
She is still on cymbalta and Quetiapine. Consider QTc check next visit.

## 2015-03-22 NOTE — Progress Notes (Signed)
Patient ID: Rebecca Holmes, female   DOB: 05/16/1975, 40 y.o.   MRN: [id number]   Subjective:   Patient ID: Rebecca Holmes female   DOB: 05/10/1975 39 y.o.   MRN: [id number]  HPI: [name] is a 40 y.o. here today to request pain meds for her chronic back pain. Please see problem based charting for assessment and plan.  Past Medical History  Diagnosis Date  . Heart murmur   . Left ventricular dysfunction     Decreased; Low E 20-25% in the setting of severe illness requiring ICU care 06/2008; Cardiolyte 06/13/2009 normal perfusion w/o significant ischemia or scar; LVEF 38%  . Respiratory failure (HCC)     Ventilator Dependent; 2/2 status asthmatics 6/11-18/2010 admit [facility]  . HTN (hypertension)     Try off ACE 06/13/2009 due to pseudoasthma  . Vision disorder   . Anemia, iron deficiency   . Asthma     vs Pseudoasthma from ACE; Trial off ACE > PFT's normal 09/09/09 x DLCO 59 > corrects to 73%  . Depression   . Insomnia    Review of Systems: CONSTITUTIONAL- No Fever, weightloss SKIN- No Rash, colour changes or itching. HEAD- No Headache or dizziness. RESPIRATORY- No Cough or SOB. CARDIAC- No Palpitations, or chest pain. GI- No vomiting, diarrhoea, abd pain. URINARY- No Frequency, or dysuria. NEUROLOGIC- No syncope, or burning.  Objective:  Physical Exam: Filed Vitals:   03/22/15 1539  BP: 126/90  Pulse: 88  Temp: 98.8 F (37.1 C)  TempSrc: Oral  Height: 5\' 7"  (1.702 m)  Weight: 169 lb 6.4 oz (76.839 kg)  SpO2: 100%   GENERAL- alert, co-operative, appears as stated age, not in any distress. HEENT- Atraumatic, normocephalic CARDIAC- RRR, no murmurs, rubs or gallops. RESP- Moving equal volumes of air, and  no wheezes or crackles. ABDOMEN- Soft, nontender,  bowel sounds present. BACK- Normal curvature of the spine,  tenderness along the vertebrae- worse right lower back, tenderness along lower back also midline, positive straight leg raise, reduced sensation to touch- right  LE, strenght- 4/5 RLE, strenght upper extremity reduced bilat, but pt not putting the effort, refuses to flex her hand.  NEURO- Alert and oriented, rapid alternating movement- intact, Gait- Normal. EXTREMITIES- Warm and well perfused, no pedal edema. SKIN- Warm, dry, No rash or lesion. PSYCH- Normal mood and affect, appropriate thought content and speech.  Assessment & Plan:  The patient's case and plan of care was discussed with attending physician, Dr. Heide SparkNarendra.  Please see problem based charting for assessment and plan.

## 2015-03-22 NOTE — Assessment & Plan Note (Signed)
Hx of congestive heart failure. Bp - WNL today. She says she is compliant with Lorsatan- 25mg  daily. No sign of fluid overload on exam.

## 2015-03-22 NOTE — Assessment & Plan Note (Signed)
Pain worse today, like in prior visits. She did not get lumber xrays done. Requesting something stronger for pain. She still smokes cigs and refused to quit. She is endorsing weakness in her right lower extremity and numbness. She no neck pain but says the strenght in both her upper extremities is reduced.  She says flexeril and methocarbamol did not work. She is applying for disability. She says he takes Alieve without relief.   Plan- Will order MRI, if patient really has significant pain, she will get this done, She has so far refused to do the xrays ordered previously. - No narcotic prescription till MRI done and shows objective reason for pain - Voltaren gel for now.

## 2015-03-23 NOTE — Progress Notes (Signed)
[facility] Medicine Clinic Attending  Case discussed with Dr. Emokpae soon after the resident saw the patient.  We reviewed the resident's history and exam and pertinent patient test results.  I agree with the assessment, diagnosis, and plan of care documented in the resident's note. 

## 2015-04-14 ENCOUNTER — Ambulatory Visit (HOSPITAL_COMMUNITY): Admission: RE | Admit: 2015-04-14 | Payer: Medicaid Other | Source: Ambulatory Visit

## 2015-04-20 ENCOUNTER — Ambulatory Visit (HOSPITAL_COMMUNITY)
Admission: RE | Admit: 2015-04-20 | Discharge: 2015-04-20 | Disposition: A | Discharge status: Home / Self Care | Payer: Medicaid Other | Source: Ambulatory Visit | Attending: Internal Medicine | Admitting: Internal Medicine

## 2015-04-20 DIAGNOSIS — M549 Dorsalgia, unspecified: Secondary | ICD-10-CM | POA: Diagnosis present

## 2015-04-20 DIAGNOSIS — M5136 Other intervertebral disc degeneration, lumbar region: Secondary | ICD-10-CM | POA: Diagnosis not present

## 2015-04-20 DIAGNOSIS — G8929 Other chronic pain: Secondary | ICD-10-CM | POA: Diagnosis present

## 2015-04-20 DIAGNOSIS — M5126 Other intervertebral disc displacement, lumbar region: Secondary | ICD-10-CM | POA: Insufficient documentation

## 2015-05-11 ENCOUNTER — Encounter: Payer: Self-pay | Admitting: Internal Medicine

## 2015-05-11 ENCOUNTER — Ambulatory Visit (INDEPENDENT_AMBULATORY_CARE_PROVIDER_SITE_OTHER): Payer: Medicaid Other | Admitting: Internal Medicine

## 2015-05-11 VITALS — BP 130/92 | HR 79 | Ht 67.0 in | Wt 161.0 lb

## 2015-05-11 DIAGNOSIS — Z720 Tobacco use: Secondary | ICD-10-CM | POA: Diagnosis not present

## 2015-05-11 DIAGNOSIS — J4530 Mild persistent asthma, uncomplicated: Secondary | ICD-10-CM

## 2015-05-11 DIAGNOSIS — F17210 Nicotine dependence, cigarettes, uncomplicated: Secondary | ICD-10-CM

## 2015-05-11 LAB — PULMONARY FUNCTION TEST
DL/VA % pred: 81 %
DL/VA: 4.2 ml/min/mmHg/L
DLCO COR: 18.54 ml/min/mmHg
DLCO UNC % PRED: 60 %
DLCO UNC: 17.23 ml/min/mmHg
DLCO cor % pred: 65 %
FEF 25-75 PRE: 1.09 L/s
FEF 25-75 Post: 2.3 L/sec
FEF2575-%CHANGE-POST: 112 %
FEF2575-%PRED-PRE: 35 %
FEF2575-%Pred-Post: 75 %
FEV1-%Change-Post: 19 %
FEV1-%PRED-POST: 92 %
FEV1-%PRED-PRE: 77 %
FEV1-POST: 2.61 L
FEV1-Pre: 2.18 L
FEV1FVC-%CHANGE-POST: 6 %
FEV1FVC-%Pred-Pre: 78 %
FEV6-%CHANGE-POST: 14 %
FEV6-%PRED-PRE: 96 %
FEV6-%Pred-Post: 109 %
FEV6-Post: 3.71 L
FEV6-Pre: 3.25 L
FEV6FVC-%Change-Post: 1 %
FEV6FVC-%PRED-POST: 100 %
FEV6FVC-%Pred-Pre: 99 %
FVC-%Change-Post: 12 %
FVC-%Pred-Post: 109 %
FVC-%Pred-Pre: 97 %
FVC-Post: 3.75 L
FVC-Pre: 3.33 L
POST FEV6/FVC RATIO: 99 %
PRE FEV1/FVC RATIO: 66 %
Post FEV1/FVC ratio: 70 %
Pre FEV6/FVC Ratio: 97 %
RV % pred: 153 %
RV: 2.65 L
TLC % PRED: 109 %
TLC: 6.03 L

## 2015-05-11 NOTE — Assessment & Plan Note (Signed)
Required ET 2010  -  PFT's  02/11/2015   FEV1 2.16 (76 % ) ratio 66  p 37 % improvement from saba with DLCO  52 % corrects to 69 % for alv volume     - 05/11/2015  extensive coaching HFA effectiveness =    90%   - PFT's  05/11/2015  FEV1 2.61 (92 % ) ratio 70  p 19 % improvement from saba p no rx  prior to study with DLCO  60 % corrects to 6581 % for alv volume    Doing very well with completely correctable airflow obst despite smoking (see separate a/p)   I had an extended discussion with the patient reviewing all relevant studies completed to date and  lasting 15 to 20 minutes of a 25 minute visit    Each maintenance medication was reviewed in detail including most importantly the difference between maintenance and prns and under what circumstances the prns are to be triggered using an action plan format that is not reflected in the computer generated alphabetically organized AVS.    Please see instructions for details which were reviewed in writing and the patient given a copy highlighting the part that I personally wrote and discussed at today's ov.

## 2015-05-11 NOTE — Progress Notes (Signed)
Subjective:    Patient ID: Rebecca Holmes, female    DOB: 1975/12/13    MRN: [id number]    Brief patient profile:  61 yobf active smoker with childhood asthma never completely resolved and required ET 2010 last seen in pulmonary clinic 2011 with nl pfts and despite continue smoking since then had completely nl pfts p saba again 05/11/2015     12/31/2014 1st [facility] office visit/ Rebecca Holmes   Chief Complaint  Patient presents with  . Pulmonatry Consult    Former patient. She states that she is here b/c her PCP "won't refill my pumps"- has been out of proair and advair for approx 2 months. She c/o cough with clear sputum, SOB and wheezing since ran out of meds and has to use neb with albuterol 2 x daily on average.   worse x 2 m with freq er trips and daily / nightly saba use including neb sev hours prior to OV   rec Plan A = automatic =  symbicort 160 Take 2 puffs first thing in am and then another 2 puffs about 12 hours later.  Plan B= backup - Only use your albuterol (proair)  Plan C= crisis - only use the nebulizer if you try to proair first and it doesn' work    03/08/2015  f/u ov/Rebecca Holmes re: mild persistent asthma/ still smoking  maint rx with symb 160 2bid/ still smoking   Chief Complaint  Patient presents with  . Follow-up    Pt states her breathing has been better since last visit. She c/o chest and nasal congestion for the past 2 days. She has minimal cough- non prod.   only needed proair 4 x since last ov On prednisone now for almond reaction with eye swelling but no flare of rhinitis or cough/ wheeze/sob rec No change in recommendations Work on inhaler technique:   The key is to stop smoking completely before smoking completely stops you!    05/11/2015  f/u ov/Rebecca Holmes re: mild chronic asthma/ maint rx symb 160 2bid  Chief Complaint  Patient presents with  . Follow-up    PFT done today. Pt states that her breathing is overall doing well. She is using proair 2 x per wk on  average and has not needed neb.   rarely needs saba as long as has symb 160 despite active smoking  No obvious day to day or daytime variability or assoc chronic cough or cp or chest tightness, subjective wheeze or overt sinus or hb symptoms. No unusual exp hx or h/o childhood pna/ asthma or knowledge of premature birth.  Sleeping ok without nocturnal  or early am exacerbation  of respiratory  c/o's or need for noct saba. Also denies any obvious fluctuation of symptoms with weather or environmental changes or other aggravating or alleviating factors except as outlined above   Current Medications, Allergies, Complete Past Medical History, Past Surgical History, Family History, and Social History were reviewed in [employer] record.  ROS  The following are not active complaints unless bolded sore throat, dysphagia, dental problems, itching, sneezing,  nasal congestion or excess/ purulent secretions, ear ache,   fever, chills, sweats, unintended wt loss, classically pleuritic or exertional cp, hemoptysis,  orthopnea pnd or leg swelling, presyncope, palpitations, abdominal pain, anorexia, nausea, vomiting, diarrhea  or change in bowel or bladder habits, change in stools or urine, dysuria,hematuria,  rash, arthralgias, visual complaints, headache, numbness, weakness or ataxia or problems with walking or coordination,  change  in mood/affect or memory.                  Objective:   Physical Exam  amb bf nad   03/08/2015        161  > 05/11/2015   161   12/31/14 155 lb (70.308 kg)  06/08/14 164 lb 4.8 oz (74.526 kg)  10/13/13 172 lb 12.8 oz (78.382 kg)    Vital signs reviewed   HEENT: nl dentition, turbinates, and oropharynx. Nl external ear canals without cough reflex   NECK :  without JVD/Nodes/TM/ nl carotid upstrokes bilaterally   LUNGS: no acc muscle use,  Nl contour chest which is clear to A and P bilaterally without cough on insp or exp maneuvers   CV:  RRR   no s3 or murmur or increase in P2, no edema   ABD:  soft and nontender with nl inspiratory excursion in the supine position. No bruits or organomegaly, bowel sounds nl  MS:  Nl gait/ ext warm without deformities, calf tenderness, cyanosis or clubbing No obvious joint restrictions   SKIN: warm and dry without lesions    NEURO:  alert, approp, nl sensorium with  no motor deficits         Assessment & Plan:

## 2015-05-11 NOTE — Patient Instructions (Signed)
Plan A = Automatic = Symbicort 160 Take 2 puffs first thing in am and then another 2 puffs about 12 hours later.   The key is to stop smoking completely before smoking completely stops you!   Work on inhaler technique:  relax and gently blow all the way out then take a nice smooth deep breath back in, triggering the inhaler at same time you start breathing in.  Hold for up to 5 seconds if you can. Blow out thru nose. Rinse and gargle with water when done       Plan B = Backup Only use your albuterol (Proair) as a rescue medication to be used if you can't catch your breath by resting or doing a relaxed purse lip breathing pattern.  - The less you use it, the better it will work when you need it. - Ok to use the inhaler up to 2 puffs  every 4 hours if you must but call for appointment if use goes up over your usual need - Don't leave home without it !!  (think of it like the spare tire for your car)   Plan C = Crisis - only use your albuterol nebulizer if you first try Plan B and it fails to help > ok to use the nebulizer up to every 4 hours but if start needing it regularly call for immediate appointment   Plan D = Doctor - call me if B and C not adequate  Plan E = ER - go to ER or call 911 if all else fails      If you are satisfied with your treatment plan,  let your doctor know and he/she can either refill your medications or you can return here when your prescription runs out.     If in any way you are not 100% satisfied,  please tell us.  If 100% better, tell your friends!  Pulmonary follow up is as needed

## 2015-05-11 NOTE — Assessment & Plan Note (Signed)

## 2015-05-11 NOTE — Progress Notes (Signed)
PFT done today. 

## 2015-06-13 ENCOUNTER — Ambulatory Visit (INDEPENDENT_AMBULATORY_CARE_PROVIDER_SITE_OTHER): Payer: Medicaid Other | Admitting: *Deleted

## 2015-06-13 DIAGNOSIS — Z308 Encounter for other contraceptive management: Secondary | ICD-10-CM

## 2015-06-13 DIAGNOSIS — Z3042 Encounter for surveillance of injectable contraceptive: Secondary | ICD-10-CM

## 2015-06-13 MED ORDER — MEDROXYPROGESTERONE ACETATE 150 MG/ML IM SUSP
150.0000 mg | Freq: Once | INTRAMUSCULAR | Status: AC
  Administered 2015-06-13: 150 mg via INTRAMUSCULAR

## 2015-09-02 ENCOUNTER — Ambulatory Visit (INDEPENDENT_AMBULATORY_CARE_PROVIDER_SITE_OTHER): Payer: Medicaid Other | Admitting: *Deleted

## 2015-09-02 DIAGNOSIS — Z308 Encounter for other contraceptive management: Secondary | ICD-10-CM

## 2015-09-02 DIAGNOSIS — Z3042 Encounter for surveillance of injectable contraceptive: Secondary | ICD-10-CM

## 2015-09-02 MED ORDER — MEDROXYPROGESTERONE ACETATE 150 MG/ML IM SUSP
150.0000 mg | Freq: Once | INTRAMUSCULAR | Status: AC
  Administered 2015-09-02: 150 mg via INTRAMUSCULAR

## 2015-09-02 NOTE — Progress Notes (Signed)
Next date Nov 16/2017 - given to pt.

## 2016-02-21 ENCOUNTER — Other Ambulatory Visit: Payer: Self-pay | Admitting: Internal Medicine

## 2016-05-10 ENCOUNTER — Other Ambulatory Visit: Payer: Self-pay | Admitting: Internal Medicine

## 2016-05-10 ENCOUNTER — Telehealth: Payer: Self-pay

## 2016-05-10 ENCOUNTER — Other Ambulatory Visit: Payer: Self-pay

## 2016-05-10 MED ORDER — ALBUTEROL SULFATE HFA 108 (90 BASE) MCG/ACT IN AERS: 2.0000 | INHALATION_SPRAY | Freq: Four times a day (QID) | RESPIRATORY_TRACT | 0 refills | Status: DC | PRN

## 2016-05-20 ENCOUNTER — Encounter (HOSPITAL_COMMUNITY): Payer: Self-pay | Admitting: Emergency Medicine

## 2016-05-20 ENCOUNTER — Emergency Department (HOSPITAL_COMMUNITY)
Admission: EM | Admit: 2016-05-20 | Discharge: 2016-05-20 | Disposition: A | Discharge status: Home / Self Care | Payer: Medicaid Other | Attending: Emergency Medicine | Admitting: Emergency Medicine

## 2016-05-20 DIAGNOSIS — I10 Essential (primary) hypertension: Secondary | ICD-10-CM | POA: Insufficient documentation

## 2016-05-20 DIAGNOSIS — J4521 Mild intermittent asthma with (acute) exacerbation: Secondary | ICD-10-CM

## 2016-05-20 DIAGNOSIS — Z91010 Allergy to peanuts: Secondary | ICD-10-CM | POA: Insufficient documentation

## 2016-05-20 DIAGNOSIS — J45901 Unspecified asthma with (acute) exacerbation: Secondary | ICD-10-CM | POA: Insufficient documentation

## 2016-05-20 DIAGNOSIS — F17210 Nicotine dependence, cigarettes, uncomplicated: Secondary | ICD-10-CM | POA: Insufficient documentation

## 2016-05-20 MED ORDER — PREDNISONE 20 MG PO TABS
60.0000 mg | ORAL_TABLET | Freq: Once | ORAL | Status: AC
  Administered 2016-05-20: 60 mg via ORAL
  Filled 2016-05-20: qty 3

## 2016-05-20 MED ORDER — IPRATROPIUM-ALBUTEROL 0.5-2.5 (3) MG/3ML IN SOLN
3.0000 mL | Freq: Once | RESPIRATORY_TRACT | Status: AC
  Administered 2016-05-20: 3 mL via RESPIRATORY_TRACT
  Filled 2016-05-20: qty 3

## 2016-05-20 MED ORDER — IPRATROPIUM BROMIDE 0.02 % IN SOLN
0.5000 mg | Freq: Once | RESPIRATORY_TRACT | Status: AC
  Administered 2016-05-20: 0.5 mg via RESPIRATORY_TRACT
  Filled 2016-05-20: qty 2.5

## 2016-05-20 MED ORDER — ALBUTEROL SULFATE (2.5 MG/3ML) 0.083% IN NEBU
5.0000 mg | INHALATION_SOLUTION | Freq: Once | RESPIRATORY_TRACT | Status: AC
  Administered 2016-05-20: 5 mg via RESPIRATORY_TRACT
  Filled 2016-05-20: qty 6

## 2016-05-20 MED ORDER — PREDNISONE 50 MG PO TABS: 50.0000 mg | ORAL_TABLET | Freq: Every day | ORAL | 0 refills | Status: DC

## 2016-05-20 MED ORDER — ALBUTEROL SULFATE HFA 108 (90 BASE) MCG/ACT IN AERS
2.0000 | INHALATION_SPRAY | Freq: Once | RESPIRATORY_TRACT | Status: AC
  Administered 2016-05-20: 2 via RESPIRATORY_TRACT
  Filled 2016-05-20: qty 6.7

## 2016-05-20 NOTE — ED Notes (Signed)
She tells me she felt "much better" after the first h.h.n. "and I'm actuallly able to lie back now--I couldn't do that before". Fine auditory wheezes persisted after the 1st h.h.n.; I will re-evaluate shortly.

## 2016-05-20 NOTE — Discharge Instructions (Signed)
Return to the ED with any concerns including difficulty breathing despite using albuterol every 4 hours, not drinking fluids, decreased urine output, vomiting and not able to keep down liquids or medications, decreased level of alertness/lethargy, or any other alarming symptoms °

## 2016-05-20 NOTE — ED Provider Notes (Signed)
[facility]-EMERGENCY DEPT Provider Note   CSN: [id number] Arrival date & time: 05/20/16  0636     History   Chief Complaint Chief Complaint  Patient presents with  . Asthma    HPI Rebecca Holmes is a 41 y.o. female.  HPI  Pt with hx of asthma presenting with c/o cough and wheezing.  She first started having symptoms yesterday evening after being outside yesterday.  Last night she was sitting up, breathing was tight, coughing.  She states she has run out of her inhalers. No fever/chills.  No productive cough.  No leg swelling.  No chest pain.  There are no other associated systemic symptoms, there are no other alleviating or modifying factors.   Past Medical History:  Diagnosis Date  . Anemia, iron deficiency   . Asthma    vs Pseudoasthma from ACE; Trial off ACE > PFT's normal 09/09/09 x DLCO 59 > corrects to 73%  . Depression   . Heart murmur   . HTN (hypertension)    Try off ACE 06/13/2009 due to pseudoasthma  . Insomnia   . Left ventricular dysfunction    Decreased; Low E 20-25% in the setting of severe illness requiring ICU care 06/2008; Cardiolyte 06/13/2009 normal perfusion w/o significant ischemia or scar; LVEF 38%  . Respiratory failure (HCC)    Ventilator Dependent; 2/2 status asthmatics 6/11-18/2010 admit [facility]  . Vision disorder     Patient Active Problem List   Diagnosis Date Noted  . Depression 04/21/2013  . Cigarette smoker 12/18/2011  . Preventative health care 07/18/2010  . Family planning, Depo-Provera contraception monitoring/administration 07/18/2010  . Neck pain, acute 07/18/2010  . Backache 10/07/2009  . Esophageal reflux 08/04/2009  . ANEMIA, IRON DEFICIENCY 06/24/2008  . HYPERTENSION, BENIGN 06/24/2008  . Congestive heart failure (HCC) 06/24/2008  . Mild persistent chronic asthma without complication 06/24/2008    Past Surgical History:  Procedure Laterality Date  . DILATION AND CURETTAGE OF UTERUS  1997    OB History    No data available        Home Medications    Prior to Admission medications   Medication Sig Start Date End Date Taking? Authorizing Provider  albuterol (PROAIR HFA) 108 (90 Base) MCG/ACT inhaler Inhale 2 puffs into the lungs every 6 (six) hours as needed for wheezing or shortness of breath. 05/10/16   Earl Lagos, MD  albuterol (PROVENTIL) (2.5 MG/3ML) 0.083% nebulizer solution Take 3 mLs (2.5 mg total) by nebulization every 6 (six) hours as needed for wheezing or shortness of breath. 10/13/13   Emokpae, Ejiroghene E, MD  ARIPiprazole (ABILIFY) 10 MG tablet Take 10 mg by mouth daily.    [provider]  diclofenac sodium (VOLTAREN) 1 % GEL Apply 2 g topically 4 (four) times daily. 03/22/15   Emokpae, Ejiroghene E, MD  diphenhydrAMINE (BENADRYL) 25 MG tablet Take 1 tablet (25 mg total) by mouth every 6 (six) hours. 02/21/15   Marlon Pel, PA-C  DULoxetine (CYMBALTA) 60 MG capsule Take 1 capsule (60 mg total) by mouth every morning. 04/21/13   McLean-Scocuzza, Pasty Spillers, MD  EPIPEN 2-PAK 0.3 MG/0.3ML SOAJ injection Inject 1 Device into the muscle daily as needed. For nut allergy 12/29/14   [provider]  famotidine (PEPCID) 20 MG tablet Take 1 tablet (20 mg total) by mouth daily. 02/21/15   Marlon Pel, PA-C  loratadine (CLARITIN) 10 MG tablet Take 1 tablet (10 mg total) by mouth daily. 02/21/15   Marlon Pel, PA-C  losartan (COZAAR)  25 MG tablet Take 1 tablet (25 mg total) by mouth daily. 10/13/13   Emokpae, Ejiroghene E, MD  predniSONE (DELTASONE) 50 MG tablet Take 1 tablet (50 mg total) by mouth daily. 05/20/16   Jerelyn ScottLinker, Martha, MD  Specialty Vitamins Products ([facility] HAIR/SKIN/NAILS PO) Take 1 tablet by mouth 3 (three) times daily.    [provider]  SYMBICORT 160-4.5 MCG/ACT inhaler INHALE 2 PUFFS FIRST THING IN THE MORNING AND THEN 12 HOURS LATER 02/21/16   Nyoka CowdenWert, Michael B, MD  zolpidem (AMBIEN) 10 MG tablet Take 10 mg by mouth at bedtime. 12/29/14   [provider]     Family History Family History  Problem Relation Age of Onset  . Diabetes Mother 7550  . Cancer Mother        unknown   . Diabetes type II Brother 20  . Asthma Maternal Grandfather   . Asthma Daughter     Social History Social History  Substance Use Topics  . Smoking status: Current Every Day Smoker    Packs/day: 0.50    Years: 25.00    Types: Cigarettes  . Smokeless tobacco: Never Used  . Alcohol use 0.0 oz/week     Comment: occasionally     Allergies   Peanut-containing drug products   Review of Systems Review of Systems  ROS reviewed and all otherwise negative except for mentioned in HPI   Physical Exam Updated Vital Signs BP (!) 143/107 (BP Location: Left Arm)   Pulse 84   Temp 98.1 F (36.7 C) (Oral)   Resp 19   Ht 5\' 7"  (1.702 m)   Wt 150 lb (68 kg)   SpO2 97%   BMI 23.49 kg/m  Vitals reviewed Physical Exam Physical Examination: General appearance - alert, well appearing, and in no distress Mental status - alert, oriented to person, place, and time Eyes - no conjunctival injection no scleral icterus Mouth - mucous membranes moist, pharynx normal without lesions Neck - supple, no significant adenopathy Chest - bilateral wheezing, normal work of breathing, breath sounds symmetric, speaking in full sentences Heart - normal rate, regular rhythm, normal S1, S2, no murmurs, rubs, clicks or gallops Abdomen - soft, nontender, nondistended, no masses or organomegaly Neurological - alert, oriented, normal speech Extremities - peripheral pulses normal, no pedal edema, no clubbing or cyanosis Skin - normal coloration and turgor, no rashes  ED Treatments / Results  Labs (all labs ordered are listed, but only abnormal results are displayed) Labs Reviewed - No data to display  EKG  EKG Interpretation None       Radiology No results found.  Procedures Procedures (including critical care time)  Medications Ordered in ED Medications   ipratropium-albuterol (DUONEB) 0.5-2.5 (3) MG/3ML nebulizer solution 3 mL (3 mLs Nebulization Given 05/20/16 0701)  albuterol (PROVENTIL) (2.5 MG/3ML) 0.083% nebulizer solution 5 mg (5 mg Nebulization Given 05/20/16 0735)  ipratropium (ATROVENT) nebulizer solution 0.5 mg (0.5 mg Nebulization Given 05/20/16 0735)  predniSONE (DELTASONE) tablet 60 mg (60 mg Oral Given 05/20/16 0805)  albuterol (PROVENTIL HFA;VENTOLIN HFA) 108 (90 Base) MCG/ACT inhaler 2 puff (2 puffs Inhalation Given 05/20/16 0840)     Initial Impression / Assessment and Plan / ED Course  I have reviewed the triage vital signs and the nursing notes.  Pertinent labs & imaging results that were available during my care of the patient were reviewed by me and considered in my medical decision making (see chart for details).     Pt presenting with c/o wheezing  and shortness and breath-- pt is feeling better after duoneb.  After 2 duonebs  She is clear- also given course of prednisone.  No signs of fluid overload to suggest CHF or fever to suggest pneumonia.  Discharged with strict return precautions.  Pt agreeable with plan.  Final Clinical Impressions(s) / ED Diagnoses   Final diagnoses:  Exacerbation of intermittent asthma, unspecified asthma severity    New Prescriptions Discharge Medication List as of 05/20/2016  8:33 AM    START taking these medications   Details  predniSONE (DELTASONE) 50 MG tablet Take 1 tablet (50 mg total) by mouth daily., Starting Sun 05/20/2016, Print         Jerelyn Scott, MD 05/20/16 [phone]

## 2016-05-20 NOTE — ED Triage Notes (Signed)
Pt reports having increasing shortness of breath over the last 2 weeks worsening yesterday after being outside. Pt states that she is out of her inhaler and nebulizer at this time. Wheezing noted on assessment

## 2016-05-23 ENCOUNTER — Telehealth: Payer: Self-pay | Admitting: *Deleted

## 2016-05-23 NOTE — Telephone Encounter (Signed)
-----   Message from Nyoka CowdenMichael B Wert, MD sent at 05/21/2016  9:51 AM EDT -----   ----- Message ----- From: Nyoka CowdenWert, Michael B, MD Sent: 05/21/2016   9:44 AM To: Nyoka CowdenMichael B Wert, MD  Pt in ER for asthma and listed me as her asthma doctor in epic but not seen in almost a year > if wants to continue with me need ov in 2 weeks with all active meds in hand (or see Tammy if not avail with me)

## 2016-05-23 NOTE — Telephone Encounter (Signed)
Spoke with the pt and scheduled ov for 5/29 and asked her to please bring all of her meds  She verbalized understanding of this

## 2016-05-25 ENCOUNTER — Ambulatory Visit: Payer: Medicaid Other

## 2016-06-05 ENCOUNTER — Ambulatory Visit: Payer: Medicaid Other | Admitting: Internal Medicine

## 2016-10-25 ENCOUNTER — Encounter (HOSPITAL_COMMUNITY): Payer: Self-pay | Admitting: Family Medicine

## 2016-10-25 ENCOUNTER — Emergency Department (HOSPITAL_COMMUNITY)
Admission: EM | Admit: 2016-10-25 | Discharge: 2016-10-26 | Disposition: A | Discharge status: Home / Self Care | Payer: Self-pay | Attending: Emergency Medicine | Admitting: Emergency Medicine

## 2016-10-25 DIAGNOSIS — Z91010 Allergy to peanuts: Secondary | ICD-10-CM | POA: Insufficient documentation

## 2016-10-25 DIAGNOSIS — K047 Periapical abscess without sinus: Secondary | ICD-10-CM | POA: Insufficient documentation

## 2016-10-25 DIAGNOSIS — Z79899 Other long term (current) drug therapy: Secondary | ICD-10-CM | POA: Insufficient documentation

## 2016-10-25 DIAGNOSIS — J45909 Unspecified asthma, uncomplicated: Secondary | ICD-10-CM | POA: Insufficient documentation

## 2016-10-25 DIAGNOSIS — F17210 Nicotine dependence, cigarettes, uncomplicated: Secondary | ICD-10-CM | POA: Insufficient documentation

## 2016-10-25 DIAGNOSIS — I110 Hypertensive heart disease with heart failure: Secondary | ICD-10-CM | POA: Insufficient documentation

## 2016-10-25 DIAGNOSIS — I509 Heart failure, unspecified: Secondary | ICD-10-CM | POA: Insufficient documentation

## 2016-10-25 LAB — I-STAT CREATININE, ED: CREATININE: 0.9 mg/dL (ref 0.44–1.00)

## 2016-10-25 MED ORDER — SODIUM CHLORIDE 0.9 % IV BOLUS (SEPSIS)
1000.0000 mL | Freq: Once | INTRAVENOUS | Status: AC
  Administered 2016-10-25: 1000 mL via INTRAVENOUS

## 2016-10-25 MED ORDER — MORPHINE SULFATE (PF) 4 MG/ML IV SOLN: 4.0000 mg | Freq: Once | INTRAVENOUS | Status: DC

## 2016-10-25 MED ORDER — CLINDAMYCIN PHOSPHATE 600 MG/50ML IV SOLN
600.0000 mg | Freq: Once | INTRAVENOUS | Status: AC
  Administered 2016-10-25: 600 mg via INTRAVENOUS
  Filled 2016-10-25: qty 50

## 2016-10-25 MED ORDER — ONDANSETRON HCL 4 MG/2ML IJ SOLN
4.0000 mg | Freq: Once | INTRAMUSCULAR | Status: AC
  Administered 2016-10-25: 4 mg via INTRAVENOUS
  Filled 2016-10-25: qty 2

## 2016-10-25 MED ORDER — KETOROLAC TROMETHAMINE 30 MG/ML IJ SOLN
15.0000 mg | Freq: Once | INTRAMUSCULAR | Status: AC
  Administered 2016-10-26: 15 mg via INTRAVENOUS
  Filled 2016-10-25: qty 1

## 2016-10-25 MED ORDER — MORPHINE SULFATE (PF) 2 MG/ML IV SOLN
2.0000 mg | Freq: Once | INTRAVENOUS | Status: AC
  Administered 2016-10-26: 2 mg via INTRAVENOUS
  Filled 2016-10-25: qty 1

## 2016-10-25 MED ORDER — MORPHINE SULFATE (PF) 4 MG/ML IV SOLN
4.0000 mg | Freq: Once | INTRAVENOUS | Status: AC
  Administered 2016-10-25: 4 mg via INTRAVENOUS
  Filled 2016-10-25: qty 1

## 2016-10-25 NOTE — ED Triage Notes (Signed)
Patient is complaining of having dental plan recently with resolving. However, now she is complaining left sided submandibular pain and swelling.

## 2016-10-26 MED ORDER — HYDROCODONE-ACETAMINOPHEN 7.5-325 MG/15ML PO SOLN: 10.0000 mL | Freq: Four times a day (QID) | ORAL | 0 refills | Status: DC | PRN

## 2016-10-26 MED ORDER — CLINDAMYCIN PALMITATE HCL 75 MG/5ML PO SOLR: 300.0000 mg | Freq: Three times a day (TID) | ORAL | 0 refills | Status: DC

## 2016-10-26 MED ORDER — HYDROCODONE-ACETAMINOPHEN 7.5-325 MG/15ML PO SOLN: 5.0000 mL | Freq: Four times a day (QID) | ORAL | 0 refills | Status: DC | PRN

## 2016-10-26 NOTE — ED Notes (Signed)
Patient came back to the nurses station reporting she was going to have one of her children, which is not of driving age, drive patient and her home. Notified Off Duty Officer to patients room to explain that patient can not drive and has to visualize the driver before discharging patient.

## 2016-10-26 NOTE — ED Notes (Signed)
Gave report to Lisa A. RN 

## 2016-10-26 NOTE — ED Notes (Signed)
Patient is attempting to get a ride and have a staff member observe the driver before allowing patient to leave with her two children. Patient has had 6mg  of MORPHINE in 2 hours.

## 2016-10-26 NOTE — ED Notes (Signed)
Patient was provided ice chips earlier. Observed patient chomping on the ice chips.

## 2016-10-26 NOTE — Discharge Instructions (Signed)
Take the antibiotic as prescribed. Have given a short course of liquid pain medicine. This medication make you drowsy so take it at night for pain that is not controlled with Motrin. Would encourage her to take around the clock Motrin or ibuprofen. May use a liquid time. Make sure you are drinking plenty of fluids. Soft diet. Follow up with a dentist. Recheck in 2-3 days with her primary care doctor or in the ED. Return sooner if her symptoms worsen.

## 2016-10-26 NOTE — ED Provider Notes (Signed)
[facility]-EMERGENCY DEPT Provider Note   CSN: [id number] Arrival date & time: 10/25/16  2048     History   Chief Complaint Chief Complaint  Patient presents with  . Lymphadenopathy    HPI Rebecca Holmes is a 41 y.o. female.  HPI 41 year-old [place] American female past medical history significant for hypertension presents to the emergency department today with complaints of left dental pain. The patient states that approximately 2 weeks ago she started having left dental pain after she fractured a tooth. States that this resolved however over the past few days she has had worsening left-sided swelling and difficulty opening her mouth due to the pain and swelling. Denies any associated fevers or chills. States that she is only minimal to eat soup can she cannot open her mouth. She has not tried anything for the pain prior to arrival. She denies any difficulties breathing. States the pain is radiating into her left ear. Pain is worse with moving the jaw and eating. Nothing makes better. Patient does not have a dentist. Past Medical History:  Diagnosis Date  . Anemia, iron deficiency   . Asthma    vs Pseudoasthma from ACE; Trial off ACE > PFT's normal 09/09/09 x DLCO 59 > corrects to 73%  . Depression   . Heart murmur   . HTN (hypertension)    Try off ACE 06/13/2009 due to pseudoasthma  . Insomnia   . Left ventricular dysfunction    Decreased; Low E 20-25% in the setting of severe illness requiring ICU care 06/2008; Cardiolyte 06/13/2009 normal perfusion w/o significant ischemia or scar; LVEF 38%  . Respiratory failure (HCC)    Ventilator Dependent; 2/2 status asthmatics 6/11-18/2010 admit [facility]  . Vision disorder     Patient Active Problem List   Diagnosis Date Noted  . Depression 04/21/2013  . Cigarette smoker 12/18/2011  . Preventative health care 07/18/2010  . Family planning, Depo-Provera contraception monitoring/administration 07/18/2010  . Neck pain,  acute 07/18/2010  . Backache 10/07/2009  . Esophageal reflux 08/04/2009  . ANEMIA, IRON DEFICIENCY 06/24/2008  . HYPERTENSION, BENIGN 06/24/2008  . Congestive heart failure (HCC) 06/24/2008  . Mild persistent chronic asthma without complication 06/24/2008    Past Surgical History:  Procedure Laterality Date  . DILATION AND CURETTAGE OF UTERUS  1997    OB History    No data available       Home Medications    Prior to Admission medications   Medication Sig Start Date End Date Taking? Authorizing Provider  albuterol (PROAIR HFA) 108 (90 Base) MCG/ACT inhaler Inhale 2 puffs into the lungs every 6 (six) hours as needed for wheezing or shortness of breath. 05/10/16   Earl Lagos, MD  albuterol (PROVENTIL) (2.5 MG/3ML) 0.083% nebulizer solution Take 3 mLs (2.5 mg total) by nebulization every 6 (six) hours as needed for wheezing or shortness of breath. 10/13/13   Emokpae, Ejiroghene E, MD  ARIPiprazole (ABILIFY) 10 MG tablet Take 10 mg by mouth daily.    [provider]  clindamycin (CLEOCIN) 75 MG/5ML solution Take 20 mLs (300 mg total) by mouth 3 (three) times daily. 10/26/16   Rise Mu, PA-C  diclofenac sodium (VOLTAREN) 1 % GEL Apply 2 g topically 4 (four) times daily. 03/22/15   Emokpae, Ejiroghene E, MD  diphenhydrAMINE (BENADRYL) 25 MG tablet Take 1 tablet (25 mg total) by mouth every 6 (six) hours. 02/21/15   Marlon Pel, PA-C  DULoxetine (CYMBALTA) 60 MG capsule Take 1 capsule (  60 mg total) by mouth every morning. 04/21/13   McLean-Scocuzza, Pasty Spillersracy N, MD  EPIPEN 2-PAK 0.3 MG/0.3ML SOAJ injection Inject 1 Device into the muscle daily as needed. For nut allergy 12/29/14   [provider]  famotidine (PEPCID) 20 MG tablet Take 1 tablet (20 mg total) by mouth daily. 02/21/15   Marlon PelGreene, Tiffany, PA-C  HYDROcodone-acetaminophen (HYCET) 7.5-325 mg/15 ml solution Take 5 mLs by mouth every 6 (six) hours as needed for moderate pain or severe pain. 10/26/16    Rise MuLeaphart, Kenneth T, PA-C  loratadine (CLARITIN) 10 MG tablet Take 1 tablet (10 mg total) by mouth daily. 02/21/15   Marlon PelGreene, Tiffany, PA-C  losartan (COZAAR) 25 MG tablet Take 1 tablet (25 mg total) by mouth daily. 10/13/13   Emokpae, Ejiroghene E, MD  predniSONE (DELTASONE) 50 MG tablet Take 1 tablet (50 mg total) by mouth daily. 05/20/16   MabeLatanya Maudlin, Martha L, MD  Specialty Vitamins Products ([facility] HAIR/SKIN/NAILS PO) Take 1 tablet by mouth 3 (three) times daily.    [provider]  SYMBICORT 160-4.5 MCG/ACT inhaler INHALE 2 PUFFS FIRST THING IN THE MORNING AND THEN 12 HOURS LATER 02/21/16   Nyoka CowdenWert, Michael B, MD  zolpidem (AMBIEN) 10 MG tablet Take 10 mg by mouth at bedtime. 12/29/14   [provider]    Family History Family History  Problem Relation Age of Onset  . Diabetes Mother 2550  . Cancer Mother        unknown   . Diabetes type II Brother 20  . Asthma Maternal Grandfather   . Asthma Daughter     Social History Social History  Substance Use Topics  . Smoking status: Current Every Day Smoker    Packs/day: 0.50    Years: 25.00    Types: Cigarettes  . Smokeless tobacco: Never Used  . Alcohol use 0.0 oz/week     Comment: Once a month     Allergies   Peanut-containing drug products   Review of Systems Review of Systems  Constitutional: Negative for chills and fever.  HENT: Positive for dental problem and trouble swallowing.   Musculoskeletal: Negative for neck pain and neck stiffness.     Physical Exam Updated Vital Signs BP (!) 132/101 (BP Location: Right Arm)   Pulse 72   Temp 99.5 F (37.5 C) (Oral)   Resp 20   Ht 5\' 8"  (1.727 m)   Wt 76.2 kg (168 lb)   LMP 10/17/2016   SpO2 100%   BMI 25.54 kg/m   Physical Exam  Constitutional: She appears well-developed and well-nourished. No distress.  HENT:  Head: Normocephalic and atraumatic.  Mouth/Throat: Uvula is midline, oropharynx is clear and moist and mucous membranes are normal. There is trismus  in the jaw. No uvula swelling.  Patient with trismus noted. Patient is in extreme pain with opening her mouth. Mild submandibular and left-sided facial swelling noted. No erythema. Did visualize the left rear lower molar that is chipped. Does appear to be draining some pus. No gross abscess noted. Tender to palpation. No appreciable gingival edema or erythema. Do not appreciate any sublingual swelling. Oropharynx appears clear of the visualized portions. Patient is managing her secretions. Tolerating her airway. Able tolerate by mouth fluids.  Eyes: Right eye exhibits no discharge. Left eye exhibits no discharge. No scleral icterus.  Neck: Normal range of motion. Neck supple.  Pulmonary/Chest: Effort normal and breath sounds normal. No respiratory distress.  Musculoskeletal: Normal range of motion.  Lymphadenopathy:    She has  no cervical adenopathy.  Neurological: She is alert.  Skin: Skin is warm and dry. Capillary refill takes less than 2 seconds. No pallor.  Psychiatric: Her behavior is normal. Judgment and thought content normal.  Nursing note and vitals reviewed.    ED Treatments / Results  Labs (all labs ordered are listed, but only abnormal results are displayed) Labs Reviewed  I-STAT CREATININE, ED    EKG  EKG Interpretation None       Radiology No results found.  Procedures Procedures (including critical care time)  Medications Ordered in ED Medications  clindamycin (CLEOCIN) IVPB 600 mg (0 mg Intravenous Stopped 10/26/16 0003)  sodium chloride 0.9 % bolus 1,000 mL (0 mLs Intravenous Stopped 10/26/16 0003)  morphine 4 MG/ML injection 4 mg (4 mg Intravenous Given 10/25/16 2306)  ondansetron (ZOFRAN) injection 4 mg (4 mg Intravenous Given 10/25/16 2305)  ketorolac (TORADOL) 30 MG/ML injection 15 mg (15 mg Intravenous Given 10/26/16 0010)  morphine 2 MG/ML injection 2 mg (2 mg Intravenous Given 10/26/16 0011)     Initial Impression / Assessment and Plan / ED  Course  I have reviewed the triage vital signs and the nursing notes.  Pertinent labs & imaging results that were available during my care of the patient were reviewed by me and considered in my medical decision making (see chart for details).     Patient resents to the ED with complaints of left lower side dental pain. Patient with trismus noted. Patient does have minimal to mild left facial left submandibular swelling. Lymphadenopathy appreciated. Patient is managing her secretions. The visualized portion of the oropharynx appreciate any posterior swelling. Patient is overall well-appearing and nontoxic. Vital signs are reassuring. Patient is afebrile. Does not meet SIRS or Sepsis criteria.  Patient has full range of motion of her neck no appreciable cervical adenopathy. Mild tender to palpation of the left side of the taste.  Patient given IV clindamycin with pain medicine and Toradol in the ED. States the pain is much improved. However she states she is still able to open her mouth. However I do visualize patient chomping on ice in the room and patient has requested several cups of ice and water. She is talking to her children and appears to be in no discomfort. Patient is nontoxic appearing. I have low suspicion for Ludwig's angina given the minimal swelling. Do not feel that imaging is indicated at this time as patient is managing her secretions and tolerating her airway. Did have my attending Dr. Donnald Garre exam patient is agreeable to above plan. Will start patient on oral clindamycin at home. Encourage symptomatically treatment at home. Close follow-up in 24-48 hours back in the ED her primary care office. Have given her dental follow-up.  Patient observed on discharge speaking in open her mouth fully. Chomping on ice in the room without any discomfort.  Pt is hemodynamically stable, in NAD, & able to ambulate in the ED. Evaluation does not show pathology that would require ongoing emergent  intervention or inpatient treatment. I explained the diagnosis to the patient. Pain has been managed & has no complaints prior to dc. Pt is comfortable with above plan and is stable for discharge at this time. All questions were answered prior to disposition. Strict return precautions for f/u to the ED were discussed. Encouraged follow up with PCP.  Pt seen ad eval by Dr. Donnald Garre who is agreeable with the above plan.   Final Clinical Impressions(s) / ED Diagnoses   Final diagnoses:  Dental abscess    New Prescriptions Current Discharge Medication List    START taking these medications   Details  clindamycin (CLEOCIN) 75 MG/5ML solution Take 20 mLs (300 mg total) by mouth 3 (three) times daily. Qty: 420 mL, Refills: 0    HYDROcodone-acetaminophen (HYCET) 7.5-325 mg/15 ml solution Take 5 mLs by mouth every 6 (six) hours as needed for moderate pain or severe pain. Qty: 15 mL, Refills: 0         Rise Mu, PA-C 10/26/16 0127    Arby Barrette, MD 11/03/16 [phone]

## 2017-03-09 ENCOUNTER — Emergency Department (HOSPITAL_COMMUNITY)
Admission: EM | Admit: 2017-03-09 | Discharge: 2017-03-09 | Disposition: A | Discharge status: Home / Self Care | Payer: Medicaid Other | Attending: Emergency Medicine | Admitting: Emergency Medicine

## 2017-03-09 ENCOUNTER — Encounter (HOSPITAL_COMMUNITY): Payer: Self-pay

## 2017-03-09 DIAGNOSIS — J45909 Unspecified asthma, uncomplicated: Secondary | ICD-10-CM | POA: Diagnosis present

## 2017-03-09 DIAGNOSIS — I110 Hypertensive heart disease with heart failure: Secondary | ICD-10-CM | POA: Diagnosis not present

## 2017-03-09 DIAGNOSIS — I509 Heart failure, unspecified: Secondary | ICD-10-CM | POA: Diagnosis not present

## 2017-03-09 DIAGNOSIS — F17210 Nicotine dependence, cigarettes, uncomplicated: Secondary | ICD-10-CM | POA: Diagnosis not present

## 2017-03-09 DIAGNOSIS — Z79899 Other long term (current) drug therapy: Secondary | ICD-10-CM | POA: Insufficient documentation

## 2017-03-09 DIAGNOSIS — J45901 Unspecified asthma with (acute) exacerbation: Secondary | ICD-10-CM | POA: Insufficient documentation

## 2017-03-09 MED ORDER — AZITHROMYCIN 250 MG PO TABS: ORAL_TABLET | ORAL | 0 refills | Status: DC

## 2017-03-09 MED ORDER — IPRATROPIUM-ALBUTEROL 0.5-2.5 (3) MG/3ML IN SOLN
3.0000 mL | Freq: Once | RESPIRATORY_TRACT | Status: AC
  Administered 2017-03-09: 3 mL via RESPIRATORY_TRACT
  Filled 2017-03-09: qty 3

## 2017-03-09 MED ORDER — IPRATROPIUM-ALBUTEROL 0.5-2.5 (3) MG/3ML IN SOLN
3.0000 mL | Freq: Once | RESPIRATORY_TRACT | Status: AC
  Administered 2017-03-09: 3 mL via RESPIRATORY_TRACT

## 2017-03-09 MED ORDER — ALBUTEROL SULFATE (2.5 MG/3ML) 0.083% IN NEBU
5.0000 mg | INHALATION_SOLUTION | Freq: Once | RESPIRATORY_TRACT | Status: AC
  Administered 2017-03-09: 5 mg via RESPIRATORY_TRACT
  Filled 2017-03-09: qty 6

## 2017-03-09 MED ORDER — ALBUTEROL SULFATE HFA 108 (90 BASE) MCG/ACT IN AERS
2.0000 | INHALATION_SPRAY | RESPIRATORY_TRACT | Status: DC | PRN
  Administered 2017-03-09: 2 via RESPIRATORY_TRACT
  Filled 2017-03-09: qty 6.7

## 2017-03-09 MED ORDER — METHYLPREDNISOLONE SODIUM SUCC 125 MG IJ SOLR
125.0000 mg | Freq: Once | INTRAMUSCULAR | Status: AC
  Administered 2017-03-09: 125 mg via INTRAVENOUS
  Filled 2017-03-09: qty 2

## 2017-03-09 MED ORDER — AZITHROMYCIN 250 MG PO TABS
500.0000 mg | ORAL_TABLET | Freq: Once | ORAL | Status: AC
  Administered 2017-03-09: 500 mg via ORAL
  Filled 2017-03-09: qty 2

## 2017-03-09 NOTE — ED Triage Notes (Signed)
Pt is here with her child and suddenly started feeling hot and knew she was about to have an asthma attack

## 2017-03-09 NOTE — ED Provider Notes (Signed)
[facility]-EMERGENCY DEPT Provider Note   CSN: [id number] Arrival date & time: 03/09/17  0410     History   Chief Complaint Chief Complaint  Patient presents with  . Asthma    HPI Rebecca Holmes is a 42 y.o. female.  Patient presents to the ED with a chief complaint of asthma exacerbation.  She reports a hx of chronic bronchitis and asthma.  She uses nightly nebulizers to help with her symptoms.  She states that she developed wheezing and cough tonight.  She denies any fever or productive cough.  She is here with her daughter for another complaint and checked in for treatment.   The history is provided by the patient. No language interpreter was used.    Past Medical History:  Diagnosis Date  . Anemia, iron deficiency   . Asthma    vs Pseudoasthma from ACE; Trial off ACE > PFT's normal 09/09/09 x DLCO 59 > corrects to 73%  . Depression   . Heart murmur   . HTN (hypertension)    Try off ACE 06/13/2009 due to pseudoasthma  . Insomnia   . Left ventricular dysfunction    Decreased; Low E 20-25% in the setting of severe illness requiring ICU care 06/2008; Cardiolyte 06/13/2009 normal perfusion w/o significant ischemia or scar; LVEF 38%  . Respiratory failure (HCC)    Ventilator Dependent; 2/2 status asthmatics 6/11-18/2010 admit [facility]  . Vision disorder     Patient Active Problem List   Diagnosis Date Noted  . Depression 04/21/2013  . Cigarette smoker 12/18/2011  . Preventative health care 07/18/2010  . Family planning, Depo-Provera contraception monitoring/administration 07/18/2010  . Neck pain, acute 07/18/2010  . Backache 10/07/2009  . Esophageal reflux 08/04/2009  . ANEMIA, IRON DEFICIENCY 06/24/2008  . HYPERTENSION, BENIGN 06/24/2008  . Congestive heart failure (HCC) 06/24/2008  . Mild persistent chronic asthma without complication 06/24/2008    Past Surgical History:  Procedure Laterality Date  . DILATION AND CURETTAGE OF UTERUS  1997    OB  History    No data available       Home Medications    Prior to Admission medications   Medication Sig Start Date End Date Taking? Authorizing Provider  albuterol (PROAIR HFA) 108 (90 Base) MCG/ACT inhaler Inhale 2 puffs into the lungs every 6 (six) hours as needed for wheezing or shortness of breath. 05/10/16   Earl Lagos, MD  albuterol (PROVENTIL) (2.5 MG/3ML) 0.083% nebulizer solution Take 3 mLs (2.5 mg total) by nebulization every 6 (six) hours as needed for wheezing or shortness of breath. 10/13/13   Emokpae, Ejiroghene E, MD  ARIPiprazole (ABILIFY) 10 MG tablet Take 10 mg by mouth daily.    [provider]  clindamycin (CLEOCIN) 75 MG/5ML solution Take 20 mLs (300 mg total) by mouth 3 (three) times daily. 10/26/16   Rise Mu, PA-C  diclofenac sodium (VOLTAREN) 1 % GEL Apply 2 g topically 4 (four) times daily. 03/22/15   Emokpae, Ejiroghene E, MD  diphenhydrAMINE (BENADRYL) 25 MG tablet Take 1 tablet (25 mg total) by mouth every 6 (six) hours. 02/21/15   Marlon Pel, PA-C  DULoxetine (CYMBALTA) 60 MG capsule Take 1 capsule (60 mg total) by mouth every morning. 04/21/13   McLean-Scocuzza, Pasty Spillers, MD  EPIPEN 2-PAK 0.3 MG/0.3ML SOAJ injection Inject 1 Device into the muscle daily as needed. For nut allergy 12/29/14   [provider]  famotidine (PEPCID) 20 MG tablet Take 1 tablet (20 mg total) by  mouth daily. 02/21/15   Marlon PelGreene, Tiffany, PA-C  HYDROcodone-acetaminophen (HYCET) 7.5-325 mg/15 ml solution Take 5 mLs by mouth every 6 (six) hours as needed for moderate pain or severe pain. 10/26/16   Rise MuLeaphart, Kenneth T, PA-C  loratadine (CLARITIN) 10 MG tablet Take 1 tablet (10 mg total) by mouth daily. 02/21/15   Marlon PelGreene, Tiffany, PA-C  losartan (COZAAR) 25 MG tablet Take 1 tablet (25 mg total) by mouth daily. 10/13/13   Emokpae, Ejiroghene E, MD  predniSONE (DELTASONE) 50 MG tablet Take 1 tablet (50 mg total) by mouth daily. 05/20/16   MabeLatanya Maudlin, Martha L, MD  Specialty  Vitamins Products ([facility] HAIR/SKIN/NAILS PO) Take 1 tablet by mouth 3 (three) times daily.    [provider]  SYMBICORT 160-4.5 MCG/ACT inhaler INHALE 2 PUFFS FIRST THING IN THE MORNING AND THEN 12 HOURS LATER 02/21/16   Nyoka CowdenWert, Michael B, MD  zolpidem (AMBIEN) 10 MG tablet Take 10 mg by mouth at bedtime. 12/29/14   [provider]    Family History Family History  Problem Relation Age of Onset  . Diabetes Mother 9450  . Cancer Mother        unknown   . Diabetes type II Brother 20  . Asthma Maternal Grandfather   . Asthma Daughter     Social History Social History   Tobacco Use  . Smoking status: Current Every Day Smoker    Packs/day: 0.50    Years: 25.00    Pack years: 12.50    Types: Cigarettes  . Smokeless tobacco: Never Used  Substance Use Topics  . Alcohol use: Yes    Alcohol/week: 0.0 oz    Comment: Once a month  . Drug use: No     Allergies   Peanut-containing drug products   Review of Systems Review of Systems  All other systems reviewed and are negative.    Physical Exam Updated Vital Signs BP (!) 158/111 (BP Location: Left Arm)   Pulse (!) 106   Temp 98.3 F (36.8 C) (Oral)   Resp 18   Ht 5\' 7"  (1.702 m)   Wt 76.2 kg (168 lb)   SpO2 95%   BMI 26.31 kg/m   Physical Exam  Constitutional: She is oriented to person, place, and time. She appears well-developed and well-nourished.  HENT:  Head: Normocephalic and atraumatic.  Eyes: Conjunctivae and EOM are normal. Pupils are equal, round, and reactive to light.  Neck: Normal range of motion. Neck supple.  Cardiovascular: Normal rate and regular rhythm. Exam reveals no gallop and no friction rub.  No murmur heard. Pulmonary/Chest: She has wheezes. She has no rales. She exhibits no tenderness.  Accessory muscle use, mild respiratory distress  Abdominal: Soft. Bowel sounds are normal. She exhibits no distension and no mass. There is no tenderness. There is no rebound and no guarding.    Musculoskeletal: Normal range of motion. She exhibits no edema or tenderness.  Neurological: She is alert and oriented to person, place, and time.  Skin: Skin is warm and dry.  Psychiatric: She has a normal mood and affect. Her behavior is normal. Judgment and thought content normal.  Nursing note and vitals reviewed.    ED Treatments / Results  Labs (all labs ordered are listed, but only abnormal results are displayed) Labs Reviewed - No data to display  EKG  EKG Interpretation None       Radiology No results found.  Procedures Procedures (including critical care time)  Medications Ordered in ED Medications  ipratropium-albuterol (DUONEB) 0.5-2.5 (3) MG/3ML nebulizer solution 3 mL (not administered)  albuterol (PROVENTIL HFA;VENTOLIN HFA) 108 (90 Base) MCG/ACT inhaler 2 puff (not administered)  albuterol (PROVENTIL) (2.5 MG/3ML) 0.083% nebulizer solution 5 mg (5 mg Nebulization Given 03/09/17 0426)  methylPREDNISolone sodium succinate (SOLU-MEDROL) 125 mg/2 mL injection 125 mg (125 mg Intravenous Given 03/09/17 0447)  ipratropium-albuterol (DUONEB) 0.5-2.5 (3) MG/3ML nebulizer solution 3 mL (3 mLs Nebulization Given 03/09/17 0438)  azithromycin (ZITHROMAX) tablet 500 mg (500 mg Oral Given 03/09/17 0507)     Initial Impression / Assessment and Plan / ED Course  I have reviewed the triage vital signs and the nursing notes.  Pertinent labs & imaging results that were available during my care of the patient were reviewed by me and considered in my medical decision making (see chart for details).    Patient with asthma exacerbation.  Will treat with solumedrol and duoneb.  Will reassess.  Upon reassessment, patient feels and looks much better.  Only minimal wheezing in lower lobes.  No more accessory muscle use.    Final Clinical Impressions(s) / ED Diagnoses   Final diagnoses:  Exacerbation of asthma, unspecified asthma severity, unspecified whether persistent    ED  Discharge Orders        Ordered    azithromycin (ZITHROMAX) 250 MG tablet     03/09/17 0529       Roxy Horseman, PA-C 03/09/17 1610    Linwood Dibbles, MD 03/09/17 [phone]

## 2017-03-09 NOTE — ED Notes (Signed)
Bed: WA07 Expected date:  Expected time:  Means of arrival:  Comments: Held for triage 3

## 2017-03-18 ENCOUNTER — Other Ambulatory Visit: Payer: Self-pay

## 2017-03-18 ENCOUNTER — Emergency Department (HOSPITAL_COMMUNITY): Payer: Medicaid Other

## 2017-03-18 ENCOUNTER — Encounter (HOSPITAL_COMMUNITY): Payer: Self-pay | Admitting: *Deleted

## 2017-03-18 ENCOUNTER — Inpatient Hospital Stay (HOSPITAL_COMMUNITY): Payer: Medicaid Other

## 2017-03-18 ENCOUNTER — Inpatient Hospital Stay (HOSPITAL_COMMUNITY)
Admission: EM | Admit: 2017-03-18 | Discharge: 2017-03-21 | DRG: 202 | Disposition: A | Discharge status: Home / Self Care | Payer: Medicaid Other | Attending: Internal Medicine | Admitting: Internal Medicine

## 2017-03-18 DIAGNOSIS — I5022 Chronic systolic (congestive) heart failure: Secondary | ICD-10-CM | POA: Diagnosis present

## 2017-03-18 DIAGNOSIS — Z825 Family history of asthma and other chronic lower respiratory diseases: Secondary | ICD-10-CM

## 2017-03-18 DIAGNOSIS — R0602 Shortness of breath: Secondary | ICD-10-CM | POA: Diagnosis present

## 2017-03-18 DIAGNOSIS — F329 Major depressive disorder, single episode, unspecified: Secondary | ICD-10-CM | POA: Diagnosis present

## 2017-03-18 DIAGNOSIS — I10 Essential (primary) hypertension: Secondary | ICD-10-CM

## 2017-03-18 DIAGNOSIS — Z9119 Patient's noncompliance with other medical treatment and regimen: Secondary | ICD-10-CM

## 2017-03-18 DIAGNOSIS — J45901 Unspecified asthma with (acute) exacerbation: Principal | ICD-10-CM | POA: Diagnosis present

## 2017-03-18 DIAGNOSIS — J4521 Mild intermittent asthma with (acute) exacerbation: Secondary | ICD-10-CM

## 2017-03-18 DIAGNOSIS — F17210 Nicotine dependence, cigarettes, uncomplicated: Secondary | ICD-10-CM | POA: Diagnosis present

## 2017-03-18 DIAGNOSIS — J9621 Acute and chronic respiratory failure with hypoxia: Secondary | ICD-10-CM | POA: Diagnosis present

## 2017-03-18 DIAGNOSIS — F32A Depression, unspecified: Secondary | ICD-10-CM

## 2017-03-18 DIAGNOSIS — J45909 Unspecified asthma, uncomplicated: Secondary | ICD-10-CM

## 2017-03-18 DIAGNOSIS — I110 Hypertensive heart disease with heart failure: Secondary | ICD-10-CM | POA: Diagnosis present

## 2017-03-18 LAB — CBC WITH DIFFERENTIAL/PLATELET
Basophils Absolute: 0 10*3/uL (ref 0.0–0.1)
Basophils Relative: 0 %
Eosinophils Absolute: 0.4 10*3/uL (ref 0.0–0.7)
Eosinophils Relative: 5 %
HCT: 37.2 % (ref 36.0–46.0)
HEMOGLOBIN: 12.2 g/dL (ref 12.0–15.0)
LYMPHS ABS: 2.2 10*3/uL (ref 0.7–4.0)
LYMPHS PCT: 29 %
MCH: 28.2 pg (ref 26.0–34.0)
MCHC: 32.8 g/dL (ref 30.0–36.0)
MCV: 85.9 fL (ref 78.0–100.0)
Monocytes Absolute: 0.6 10*3/uL (ref 0.1–1.0)
Monocytes Relative: 8 %
NEUTROS ABS: 4.6 10*3/uL (ref 1.7–7.7)
NEUTROS PCT: 58 %
PLATELETS: 170 10*3/uL (ref 150–400)
RBC: 4.33 MIL/uL (ref 3.87–5.11)
RDW: 16.2 % — ABNORMAL HIGH (ref 11.5–15.5)
WBC: 7.8 10*3/uL (ref 4.0–10.5)

## 2017-03-18 LAB — COMPREHENSIVE METABOLIC PANEL
ALT: 12 U/L — AB (ref 14–54)
AST: 21 U/L (ref 15–41)
Albumin: 4.2 g/dL (ref 3.5–5.0)
Alkaline Phosphatase: 60 U/L (ref 38–126)
Anion gap: 8 (ref 5–15)
BUN: 7 mg/dL (ref 6–20)
CALCIUM: 9.1 mg/dL (ref 8.9–10.3)
CHLORIDE: 106 mmol/L (ref 101–111)
CO2: 25 mmol/L (ref 22–32)
CREATININE: 0.96 mg/dL (ref 0.44–1.00)
Glucose, Bld: 86 mg/dL (ref 65–99)
Potassium: 3.2 mmol/L — ABNORMAL LOW (ref 3.5–5.1)
SODIUM: 139 mmol/L (ref 135–145)
Total Bilirubin: 0.2 mg/dL — ABNORMAL LOW (ref 0.3–1.2)
Total Protein: 7.6 g/dL (ref 6.5–8.1)

## 2017-03-18 LAB — BLOOD GAS, VENOUS
ACID-BASE DEFICIT: 4.8 mmol/L — AB (ref 0.0–2.0)
Acid-base deficit: 6.3 mmol/L — ABNORMAL HIGH (ref 0.0–2.0)
BICARBONATE: 21.5 mmol/L (ref 20.0–28.0)
Bicarbonate: 20.8 mmol/L (ref 20.0–28.0)
DELIVERY SYSTEMS: POSITIVE
FIO2: 28
MODE: POSITIVE
O2 SAT: 49.8 %
O2 Saturation: 79.7 %
PCO2 VEN: 47 mmHg (ref 44.0–60.0)
PO2 VEN: 33.1 mmHg (ref 32.0–45.0)
PO2 VEN: 54.5 mmHg — AB (ref 32.0–45.0)
Patient temperature: 98.6
Patient temperature: 98.6
RATE: 8 resp/min
pCO2, Ven: 48.4 mmHg (ref 44.0–60.0)
pH, Ven: 7.282 (ref 7.250–7.430)
pH, Ven: 7.256 (ref 7.250–7.430)

## 2017-03-18 LAB — I-STAT BETA HCG BLOOD, ED (MC, WL, AP ONLY)

## 2017-03-18 LAB — BRAIN NATRIURETIC PEPTIDE: B NATRIURETIC PEPTIDE 5: 42.3 pg/mL (ref 0.0–100.0)

## 2017-03-18 LAB — INFLUENZA PANEL BY PCR (TYPE A & B)
Influenza A By PCR: NEGATIVE
Influenza B By PCR: NEGATIVE

## 2017-03-18 LAB — MRSA PCR SCREENING: MRSA BY PCR: NEGATIVE

## 2017-03-18 MED ORDER — ONDANSETRON HCL 4 MG/2ML IJ SOLN: 4.0000 mg | Freq: Four times a day (QID) | INTRAMUSCULAR | Status: DC | PRN

## 2017-03-18 MED ORDER — ALBUTEROL SULFATE (2.5 MG/3ML) 0.083% IN NEBU
5.0000 mg | INHALATION_SOLUTION | Freq: Once | RESPIRATORY_TRACT | Status: AC
  Administered 2017-03-18: 5 mg via RESPIRATORY_TRACT
  Filled 2017-03-18: qty 6

## 2017-03-18 MED ORDER — MAGNESIUM SULFATE 2 GM/50ML IV SOLN
2.0000 g | Freq: Once | INTRAVENOUS | Status: AC
  Administered 2017-03-18: 2 g via INTRAVENOUS
  Filled 2017-03-18: qty 50

## 2017-03-18 MED ORDER — ALBUTEROL SULFATE (2.5 MG/3ML) 0.083% IN NEBU: 2.5000 mg | INHALATION_SOLUTION | Freq: Four times a day (QID) | RESPIRATORY_TRACT | Status: DC

## 2017-03-18 MED ORDER — ENOXAPARIN SODIUM 40 MG/0.4ML ~~LOC~~ SOLN
40.0000 mg | SUBCUTANEOUS | Status: DC
  Administered 2017-03-19 (×2): 40 mg via SUBCUTANEOUS
  Filled 2017-03-18 (×2): qty 0.4

## 2017-03-18 MED ORDER — ALBUTEROL (5 MG/ML) CONTINUOUS INHALATION SOLN
10.0000 mg/h | INHALATION_SOLUTION | RESPIRATORY_TRACT | Status: DC
  Filled 2017-03-18: qty 20

## 2017-03-18 MED ORDER — SODIUM CHLORIDE 0.9 % IV SOLN: 250.0000 mL | INTRAVENOUS | Status: DC | PRN

## 2017-03-18 MED ORDER — FAMOTIDINE 20 MG PO TABS
20.0000 mg | ORAL_TABLET | Freq: Every day | ORAL | Status: DC
  Administered 2017-03-18 – 2017-03-21 (×4): 20 mg via ORAL
  Filled 2017-03-18 (×4): qty 1

## 2017-03-18 MED ORDER — METHYLPREDNISOLONE SODIUM SUCC 125 MG IJ SOLR
60.0000 mg | Freq: Four times a day (QID) | INTRAMUSCULAR | Status: DC
  Administered 2017-03-18 – 2017-03-20 (×7): 60 mg via INTRAVENOUS
  Filled 2017-03-18 (×6): qty 2

## 2017-03-18 MED ORDER — DULOXETINE HCL 60 MG PO CPEP
60.0000 mg | ORAL_CAPSULE | Freq: Every morning | ORAL | Status: DC
  Administered 2017-03-19 – 2017-03-21 (×3): 60 mg via ORAL
  Filled 2017-03-18 (×2): qty 2
  Filled 2017-03-18 (×3): qty 1
  Filled 2017-03-18: qty 2

## 2017-03-18 MED ORDER — ALBUTEROL (5 MG/ML) CONTINUOUS INHALATION SOLN
5.0000 mg/h | INHALATION_SOLUTION | Freq: Once | RESPIRATORY_TRACT | Status: AC
  Administered 2017-03-18: 5 mg/h via RESPIRATORY_TRACT
  Filled 2017-03-18: qty 20

## 2017-03-18 MED ORDER — ONDANSETRON HCL 4 MG PO TABS
4.0000 mg | ORAL_TABLET | Freq: Four times a day (QID) | ORAL | Status: DC | PRN
  Administered 2017-03-19 – 2017-03-20 (×2): 4 mg via ORAL
  Filled 2017-03-18 (×2): qty 1

## 2017-03-18 MED ORDER — ALBUTEROL (5 MG/ML) CONTINUOUS INHALATION SOLN
INHALATION_SOLUTION | RESPIRATORY_TRACT | Status: AC
  Administered 2017-03-18: 15:00:00
  Filled 2017-03-18: qty 20

## 2017-03-18 MED ORDER — ZOLPIDEM TARTRATE 5 MG PO TABS
5.0000 mg | ORAL_TABLET | Freq: Every evening | ORAL | Status: DC | PRN
  Administered 2017-03-19 – 2017-03-20 (×2): 5 mg via ORAL
  Filled 2017-03-18 (×2): qty 1

## 2017-03-18 MED ORDER — SODIUM CHLORIDE 0.9% FLUSH: 3.0000 mL | INTRAVENOUS | Status: DC | PRN

## 2017-03-18 MED ORDER — SODIUM CHLORIDE 0.9 % IV SOLN
500.0000 mg | INTRAVENOUS | Status: DC
Start: 2017-03-18 — End: 2017-03-19
  Administered 2017-03-18: 500 mg via INTRAVENOUS
  Filled 2017-03-18: qty 500

## 2017-03-18 MED ORDER — SODIUM CHLORIDE 0.9 % IV BOLUS (SEPSIS)
1000.0000 mL | Freq: Once | INTRAVENOUS | Status: AC
  Administered 2017-03-18: 1000 mL via INTRAVENOUS

## 2017-03-18 MED ORDER — ALBUTEROL SULFATE (2.5 MG/3ML) 0.083% IN NEBU
2.5000 mg | INHALATION_SOLUTION | RESPIRATORY_TRACT | Status: DC | PRN
  Administered 2017-03-19 – 2017-03-20 (×7): 2.5 mg via RESPIRATORY_TRACT
  Filled 2017-03-18 (×7): qty 3

## 2017-03-18 MED ORDER — IPRATROPIUM-ALBUTEROL 0.5-2.5 (3) MG/3ML IN SOLN
3.0000 mL | Freq: Four times a day (QID) | RESPIRATORY_TRACT | Status: DC
  Administered 2017-03-18 – 2017-03-20 (×8): 3 mL via RESPIRATORY_TRACT
  Filled 2017-03-18 (×9): qty 3

## 2017-03-18 MED ORDER — SODIUM CHLORIDE 0.9% FLUSH
3.0000 mL | Freq: Two times a day (BID) | INTRAVENOUS | Status: DC
  Administered 2017-03-19 – 2017-03-21 (×6): 3 mL via INTRAVENOUS

## 2017-03-18 MED ORDER — EPINEPHRINE 0.3 MG/0.3ML IJ SOAJ
0.3000 mg | Freq: Every day | INTRAMUSCULAR | Status: DC | PRN
  Filled 2017-03-18: qty 0.3

## 2017-03-18 MED ORDER — ACETAMINOPHEN 650 MG RE SUPP: 650.0000 mg | Freq: Four times a day (QID) | RECTAL | Status: DC | PRN

## 2017-03-18 MED ORDER — ACETAMINOPHEN 325 MG PO TABS: 650.0000 mg | ORAL_TABLET | Freq: Four times a day (QID) | ORAL | Status: DC | PRN

## 2017-03-18 MED ORDER — METHYLPREDNISOLONE SODIUM SUCC 125 MG IJ SOLR
125.0000 mg | Freq: Once | INTRAMUSCULAR | Status: AC
  Administered 2017-03-18: 125 mg via INTRAVENOUS
  Filled 2017-03-18: qty 2

## 2017-03-18 MED ORDER — LOSARTAN POTASSIUM 50 MG PO TABS
25.0000 mg | ORAL_TABLET | Freq: Every day | ORAL | Status: DC
  Administered 2017-03-19 – 2017-03-21 (×3): 25 mg via ORAL
  Filled 2017-03-18 (×3): qty 1

## 2017-03-18 MED ORDER — IPRATROPIUM BROMIDE 0.02 % IN SOLN: 0.5000 mg | Freq: Four times a day (QID) | RESPIRATORY_TRACT | Status: DC

## 2017-03-18 MED ORDER — HYDRALAZINE HCL 20 MG/ML IJ SOLN: 5.0000 mg | Freq: Four times a day (QID) | INTRAMUSCULAR | Status: DC | PRN

## 2017-03-18 MED ORDER — HYDROCODONE-ACETAMINOPHEN 5-325 MG PO TABS
1.0000 | ORAL_TABLET | ORAL | Status: DC | PRN
  Administered 2017-03-18: 1 via ORAL
  Administered 2017-03-19 (×2): 2 via ORAL
  Filled 2017-03-18 (×2): qty 2
  Filled 2017-03-18: qty 1

## 2017-03-18 NOTE — ED Notes (Signed)
Report given to ICU RN

## 2017-03-18 NOTE — ED Provider Notes (Signed)
[facility]-EMERGENCY DEPT Provider Note   CSN: [id number] Arrival date & time: 03/18/17  0855     History   Chief Complaint Chief Complaint  Patient presents with  . Shortness of Breath    HPI Rebecca Holmes is a 42 y.o. female.  HPI   42 year old female presenting with shortness of breath.  Patient has history of asthma.  Has required 1 intubation the last 10 years.  Patient is here with acute shortness breath unable to take detailed history given the acuteness of the shortness of breath.  Past Medical History:  Diagnosis Date  . Anemia, iron deficiency   . Asthma    vs Pseudoasthma from ACE; Trial off ACE > PFT's normal 09/09/09 x DLCO 59 > corrects to 73%  . Depression   . Heart murmur   . HTN (hypertension)    Try off ACE 06/13/2009 due to pseudoasthma  . Insomnia   . Left ventricular dysfunction    Decreased; Low E 20-25% in the setting of severe illness requiring ICU care 06/2008; Cardiolyte 06/13/2009 normal perfusion w/o significant ischemia or scar; LVEF 38%  . Respiratory failure (HCC)    Ventilator Dependent; 2/2 status asthmatics 6/11-18/2010 admit [facility]  . Vision disorder     Patient Active Problem List   Diagnosis Date Noted  . Depression 04/21/2013  . Cigarette smoker 12/18/2011  . Preventative health care 07/18/2010  . Family planning, Depo-Provera contraception monitoring/administration 07/18/2010  . Neck pain, acute 07/18/2010  . Backache 10/07/2009  . Esophageal reflux 08/04/2009  . ANEMIA, IRON DEFICIENCY 06/24/2008  . HYPERTENSION, BENIGN 06/24/2008  . Congestive heart failure (HCC) 06/24/2008  . Mild persistent chronic asthma without complication 06/24/2008    Past Surgical History:  Procedure Laterality Date  . DILATION AND CURETTAGE OF UTERUS  1997    OB History    No data available       Home Medications    Prior to Admission medications   Medication Sig Start Date End Date Taking? Authorizing Provider    albuterol (PROAIR HFA) 108 (90 Base) MCG/ACT inhaler Inhale 2 puffs into the lungs every 6 (six) hours as needed for wheezing or shortness of breath. 05/10/16   Earl Lagos, MD  albuterol (PROVENTIL) (2.5 MG/3ML) 0.083% nebulizer solution Take 3 mLs (2.5 mg total) by nebulization every 6 (six) hours as needed for wheezing or shortness of breath. 10/13/13   Emokpae, Ejiroghene E, MD  ARIPiprazole (ABILIFY) 10 MG tablet Take 10 mg by mouth daily.    [provider]  azithromycin (ZITHROMAX) 250 MG tablet Take 1 tablet every day until finished starting tomorrow. 03/09/17   Roxy Horseman, PA-C  clindamycin (CLEOCIN) 75 MG/5ML solution Take 20 mLs (300 mg total) by mouth 3 (three) times daily. 10/26/16   Rise Mu, PA-C  diclofenac sodium (VOLTAREN) 1 % GEL Apply 2 g topically 4 (four) times daily. 03/22/15   Emokpae, Ejiroghene E, MD  diphenhydrAMINE (BENADRYL) 25 MG tablet Take 1 tablet (25 mg total) by mouth every 6 (six) hours. 02/21/15   Marlon Pel, PA-C  DULoxetine (CYMBALTA) 60 MG capsule Take 1 capsule (60 mg total) by mouth every morning. 04/21/13   McLean-Scocuzza, Pasty Spillers, MD  EPIPEN 2-PAK 0.3 MG/0.3ML SOAJ injection Inject 1 Device into the muscle daily as needed. For nut allergy 12/29/14   [provider]  famotidine (PEPCID) 20 MG tablet Take 1 tablet (20 mg total) by mouth daily. 02/21/15   Marlon Pel, PA-C  HYDROcodone-acetaminophen (HYCET)  7.5-325 mg/15 ml solution Take 5 mLs by mouth every 6 (six) hours as needed for moderate pain or severe pain. 10/26/16   Rise MuLeaphart, Kenneth T, PA-C  loratadine (CLARITIN) 10 MG tablet Take 1 tablet (10 mg total) by mouth daily. 02/21/15   Marlon PelGreene, Tiffany, PA-C  losartan (COZAAR) 25 MG tablet Take 1 tablet (25 mg total) by mouth daily. 10/13/13   Emokpae, Ejiroghene E, MD  predniSONE (DELTASONE) 50 MG tablet Take 1 tablet (50 mg total) by mouth daily. 05/20/16   MabeLatanya Maudlin, Martha L, MD  Specialty Vitamins Products ([facility]  HAIR/SKIN/NAILS PO) Take 1 tablet by mouth 3 (three) times daily.    [provider]  SYMBICORT 160-4.5 MCG/ACT inhaler INHALE 2 PUFFS FIRST THING IN THE MORNING AND THEN 12 HOURS LATER 02/21/16   Nyoka CowdenWert, Michael B, MD  zolpidem (AMBIEN) 10 MG tablet Take 10 mg by mouth at bedtime. 12/29/14   [provider]    Family History Family History  Problem Relation Age of Onset  . Diabetes Mother 5150  . Cancer Mother        unknown   . Diabetes type II Brother 20  . Asthma Maternal Grandfather   . Asthma Daughter     Social History Social History   Tobacco Use  . Smoking status: Current Every Day Smoker    Packs/day: 0.50    Years: 25.00    Pack years: 12.50    Types: Cigarettes  . Smokeless tobacco: Never Used  Substance Use Topics  . Alcohol use: Yes    Alcohol/week: 0.0 oz    Comment: Once a month  . Drug use: No     Allergies   Peanut-containing drug products   Review of Systems Review of Systems  Unable to perform ROS: Acuity of condition     Physical Exam Updated Vital Signs BP (!) 161/94 (BP Location: Left Arm)   Pulse (!) 111   Temp 98.4 F (36.9 C) (Oral)   Resp (!) 24   LMP 01/18/2017 (Approximate) Comment: shielded  SpO2 (!) 89%   Physical Exam  Constitutional: She is oriented to person, place, and time. She appears well-developed and well-nourished.  HENT:  Head: Normocephalic and atraumatic.  Eyes: Right eye exhibits no discharge.  Cardiovascular:  tachycardia  Pulmonary/Chest: Accessory muscle usage present. Tachypnea noted. She is in respiratory distress. She has decreased breath sounds. She has wheezes. She has rhonchi. She has rales.  Abdominal: Soft. There is no tenderness.  Neurological: She is oriented to person, place, and time.  Skin: Skin is warm and dry. She is not diaphoretic.  Psychiatric: She has a normal mood and affect.  Nursing note and vitals reviewed.    ED Treatments / Results  Labs (all labs ordered are  listed, but only abnormal results are displayed) Labs Reviewed  CBC WITH DIFFERENTIAL/PLATELET - Abnormal; Notable for the following components:      Result Value   RDW 16.2 (*)    All other components within normal limits  COMPREHENSIVE METABOLIC PANEL    EKG  EKG Interpretation  Date/Time:  Monday March 18 2017 09:12:54 EDT Ventricular Rate:  106 PR Interval:    QRS Duration: 110 QT Interval:  347 QTC Calculation: 461 R Axis:   -68 Text Interpretation:  Sinus tachycardia Multiple ventricular premature complexes LAD, consider left anterior fascicular block Borderline repolarization abnormality Baseline wander in lead(s) II III aVF V5 V6 tachycardia noted.  Confirmed by Corlis LeakMackuen, Aarib Pulido ([id number]) on 03/18/2017 9:59:52 AM  Radiology No results found.  Procedures Procedures (including critical care time)  CRITICAL CARE Performed by: Arlana Hove Total critical care time: 90 minutes Critical care time was exclusive of separately billable procedures and treating other patients. Critical care was necessary to treat or prevent imminent or life-threatening deterioration. Critical care was time spent personally by me on the following activities: development of treatment plan with patient and/or surrogate as well as nursing, discussions with consultants, evaluation of patient's response to treatment, examination of patient, obtaining history from patient or surrogate, ordering and performing treatments and interventions, ordering and review of laboratory studies, ordering and review of radiographic studies, pulse oximetry and re-evaluation of patient's condition.   Medications Ordered in ED Medications  albuterol (PROVENTIL) (2.5 MG/3ML) 0.083% nebulizer solution 5 mg (5 mg Nebulization Given 03/18/17 0913)  methylPREDNISolone sodium succinate (SOLU-MEDROL) 125 mg/2 mL injection 125 mg (125 mg Intravenous Given 03/18/17 0939)  sodium chloride 0.9 % bolus 1,000 mL (1,000 mLs  Intravenous New Bag/Given 03/18/17 0938)  magnesium sulfate IVPB 2 g 50 mL (2 g Intravenous New Bag/Given 03/18/17 0939)  albuterol (PROVENTIL,VENTOLIN) solution continuous neb (5 mg/hr Nebulization Given 03/18/17 0956)     Initial Impression / Assessment and Plan / ED Course  I have reviewed the triage vital signs and the nursing notes.  Pertinent labs & imaging results that were available during my care of the patient were reviewed by me and considered in my medical decision making (see chart for details).     42 year old female presenting with shortness of breath.  Patient has history of asthma.  Has required 1 intubation the last 10 years.  Patient is here with acute shortness breath unable to take detailed history given the acuteness of the shortness of breath.  10:00 AM Will start on continuous.  Consider BiPAP.  Give magnesium Solu-Medrol.  11:26 AM Patient improved slightly on BiPAP.     1:30 pm  Then patient became acutely discomfort uncomfortable again.  Stat chest x-ray done, it appeared baseline.  Patient got back on BiPAP and is much more comfortable.  We will still continue to admit to stepdown.  Final Clinical Impressions(s) / ED Diagnoses   Final diagnoses:  None    ED Discharge Orders    None       Abelino Derrick, MD 03/19/17 1615

## 2017-03-18 NOTE — ED Triage Notes (Signed)
Pt reports waking up this morning at 3AM with shortness of breath, pain in her chest, and dizziness. Pt tried her home nebulizer and albuterol inhaler.

## 2017-03-18 NOTE — Progress Notes (Signed)
Pt stable and comfortable on 2Lpm nasal cannula.  Pt WOB within normal limits.  No BiPAP indicated at this time.

## 2017-03-18 NOTE — Progress Notes (Signed)
RT assisted with [facility] while on BiPAP. Upon arrival to ICU 1226 PT asked to be removed from BiPAP- granted. PT has been placed on 2 lpm [place]. RN at bedside at this time.

## 2017-03-18 NOTE — ED Notes (Signed)
resp called for venous gas and to help take pt to ICU

## 2017-03-18 NOTE — H&P (Signed)
[facility]                                                                                    Patient Demographics  Rebecca Holmes, is a 42 y.o. female  CSN: [id number]  MRN: [id number]  DOB - 07/09/1975  Admit Date - 03/18/2017  Outpatient Primary MD for the patient is Patient, No Pcp Per   With History of -  Past Medical History:  Diagnosis Date  . Anemia, iron deficiency   . Asthma    vs Pseudoasthma from ACE; Trial off ACE > PFT's normal 09/09/09 x DLCO 59 > corrects to 73%  . Depression   . Heart murmur   . HTN (hypertension)    Try off ACE 06/13/2009 due to pseudoasthma  . Insomnia   . Left ventricular dysfunction    Decreased; Low E 20-25% in the setting of severe illness requiring ICU care 06/2008; Cardiolyte 06/13/2009 normal perfusion w/o significant ischemia or scar; LVEF 38%  . Respiratory failure (HCC)    Ventilator Dependent; 2/2 status asthmatics 6/11-18/2010 admit [facility]  . Vision disorder       Past Surgical History:  Procedure Laterality Date  . DILATION AND CURETTAGE OF UTERUS  1997    in for   Chief Complaint  Patient presents with  . Shortness of Breath     HPI  Rebecca Holmes  is a 42 y.o. female, with past medical history significant for asthma presenting with 1 day history of increasing shortness of breath with wheezing, fever.  Patient has nonproductive cough.  No nausea or vomiting, reports pleuritic chest discomfort but no frank chest pain.  No history of abdominal pain or diarrhea.  In the emergency room the patient was noted to be in severe respiratory distress and using accessory muscles significantly so she was placed on BiPAP.    Review of Systems    In addition to the HPI above,   No Headache, No changes with Vision or hearing, No problems swallowing food or Liquids, No Abdominal pain, No Nausea or Vommitting, Bowel movements are regular, No Blood in stool or Urine, No dysuria, No new skin rashes or bruises, No new joints  pains-aches,  No new weakness, tingling, numbness in any extremity, No recent weight gain or loss, No polyuria, polydypsia or polyphagia, No significant Mental Stressors.  A full 10 point Review of Systems was done, except as stated above, all other Review of Systems were negative.   Social History Social History   Tobacco Use  . Smoking status: Current Every Day Smoker    Packs/day: 0.50    Years: 25.00    Pack years: 12.50    Types: Cigarettes  . Smokeless tobacco: Never Used  Substance Use Topics  . Alcohol use: Yes    Alcohol/week: 0.0 oz    Comment: Once a month     Family History Family History  Problem Relation Age of Onset  . Diabetes Mother 3350  . Cancer Mother        unknown   . Diabetes type II Brother 20  . Asthma Maternal Grandfather   . Asthma Daughter  Prior to Admission medications   Medication Sig Start Date End Date Taking? Authorizing Provider  albuterol (PROAIR HFA) 108 (90 Base) MCG/ACT inhaler Inhale 2 puffs into the lungs every 6 (six) hours as needed for wheezing or shortness of breath. 05/10/16  Yes Earl Lagos, MD  albuterol (PROVENTIL) (2.5 MG/3ML) 0.083% nebulizer solution Take 3 mLs (2.5 mg total) by nebulization every 6 (six) hours as needed for wheezing or shortness of breath. 10/13/13  Yes Emokpae, Ejiroghene E, MD  azithromycin (ZITHROMAX) 250 MG tablet Take 1 tablet every day until finished starting tomorrow. 03/09/17  Yes Roxy Horseman, PA-C  famotidine (PEPCID) 20 MG tablet Take 1 tablet (20 mg total) by mouth daily. 02/21/15  Yes Neva Seat, Tiffany, PA-C  losartan (COZAAR) 25 MG tablet Take 1 tablet (25 mg total) by mouth daily. 10/13/13  Yes Emokpae, Ejiroghene E, MD  clindamycin (CLEOCIN) 75 MG/5ML solution Take 20 mLs (300 mg total) by mouth 3 (three) times daily. Patient not taking: Reported on 03/18/2017 10/26/16   Demetrios Loll T, PA-C  diclofenac sodium (VOLTAREN) 1 % GEL Apply 2 g topically 4 (four) times daily. Patient  not taking: Reported on 03/18/2017 03/22/15   Onnie Boer, MD  diphenhydrAMINE (BENADRYL) 25 MG tablet Take 1 tablet (25 mg total) by mouth every 6 (six) hours. Patient not taking: Reported on 03/18/2017 02/21/15   Marlon Pel, PA-C  DULoxetine (CYMBALTA) 60 MG capsule Take 1 capsule (60 mg total) by mouth every morning. Patient not taking: Reported on 03/18/2017 04/21/13   McLean-Scocuzza, Pasty Spillers, MD  EPIPEN 2-PAK 0.3 MG/0.3ML SOAJ injection Inject 1 Device into the muscle daily as needed. For nut allergy 12/29/14   [provider]  HYDROcodone-acetaminophen (HYCET) 7.5-325 mg/15 ml solution Take 5 mLs by mouth every 6 (six) hours as needed for moderate pain or severe pain. Patient not taking: Reported on 03/18/2017 10/26/16   Demetrios Loll T, PA-C  loratadine (CLARITIN) 10 MG tablet Take 1 tablet (10 mg total) by mouth daily. Patient not taking: Reported on 03/18/2017 02/21/15   Marlon Pel, PA-C  predniSONE (DELTASONE) 50 MG tablet Take 1 tablet (50 mg total) by mouth daily. Patient not taking: Reported on 03/18/2017 05/20/16   Mabe, Latanya Maudlin, MD  SYMBICORT 160-4.5 MCG/ACT inhaler INHALE 2 PUFFS FIRST THING IN THE MORNING AND THEN 12 HOURS LATER Patient not taking: Reported on 03/18/2017 02/21/16   Nyoka Cowden, MD    Allergies  Allergen Reactions  . Peanut-Containing Drug Products Swelling    REACTION: swell up    Physical Exam  Vitals  Blood pressure (!) 161/94, pulse (!) 115, temperature 98.4 F (36.9 C), temperature source Oral, resp. rate 18, last menstrual period 01/18/2017, SpO2 97 %.   1. General Young female in significant respiratory distress  2. Normal affect and insight, Not Suicidal or Homicidal, Awake Alert, Oriented X 3.  3. No F.N deficits, grossly, patient will not extremities.  4. Ears and Eyes appear Normal, Conjunctivae clear, PERRLA. Moist Oral Mucosa.  5. Supple Neck, No JVD, No cervical lymphadenopathy appriciated, No Carotid  Bruits.  6. Symmetrical Chest wall movement, bilateral wheezing with intercostal retractions.  7. RRR, No Gallops, Rubs or Murmurs, No Parasternal Heave.  8. Positive Bowel Sounds, Abdomen Soft, Non tender, No organomegaly appriciated,No rebound -guarding or rigidity.  9.  No Cyanosis, Normal Skin Turgor, No Skin Rash or Bruise.  10. Good muscle tone,  joints appear normal , no effusions, Normal ROM.    Data Review  CBC  Recent Labs  Lab 03/18/17 0923  WBC 7.8  HGB 12.2  HCT 37.2  PLT 170  MCV 85.9  MCH 28.2  MCHC 32.8  RDW 16.2*  LYMPHSABS 2.2  MONOABS 0.6  EOSABS 0.4  BASOSABS 0.0   ------------------------------------------------------------------------------------------------------------------  Chemistries  Recent Labs  Lab 03/18/17 0923  NA 139  K 3.2*  CL 106  CO2 25  GLUCOSE 86  BUN 7  CREATININE 0.96  CALCIUM 9.1  AST 21  ALT 12*  ALKPHOS 60  BILITOT 0.2*   ------------------------------------------------------------------------------------------------------------------ estimated creatinine clearance is 82.1 mL/min (by C-G formula based on SCr of 0.96 mg/dL). ------------------------------------------------------------------------------------------------------------------ No results for input(s): TSH, T4TOTAL, T3FREE, THYROIDAB in the last 72 hours.  Invalid input(s): FREET3   Coagulation profile No results for input(s): INR, PROTIME in the last 168 hours. ------------------------------------------------------------------------------------------------------------------- No results for input(s): DDIMER in the last 72 hours. -------------------------------------------------------------------------------------------------------------------  Cardiac Enzymes No results for input(s): CKMB, TROPONINI, MYOGLOBIN in the last 168 hours.  Invalid input(s):  CK ------------------------------------------------------------------------------------------------------------------ Invalid input(s): POCBNP   ---------------------------------------------------------------------------------------------------------------  Urinalysis    Component Value Date/Time   COLORURINE YELLOW 12/18/2011 0845   APPEARANCEUR CLEAR 12/18/2011 0845   LABSPEC 1.029 12/18/2011 0845   PHURINE 5.5 12/18/2011 0845   GLUCOSEU NEG 12/18/2011 0845   HGBUR SMALL (A) 12/18/2011 0845   BILIRUBINUR NEG 12/18/2011 0845   KETONESUR NEG 12/18/2011 0845   PROTEINUR NEG 12/18/2011 0845   UROBILINOGEN 0.2 12/18/2011 0845   NITRITE POS (A) 12/18/2011 0845   LEUKOCYTESUR NEG 12/18/2011 0845    ----------------------------------------------------------------------------------------------------------------   Imaging results:   Dg Chest 2 View  Result Date: 03/18/2017 CLINICAL DATA:  Shortness of breath. Cough for 3 days. Current smoker. EXAM: CHEST - 2 VIEW COMPARISON:  10/08/2014 FINDINGS: The cardiomediastinal silhouette is within normal limits. There is mild peribronchial thickening which is similar to the prior study. No confluent airspace opacity, edema, pleural effusion, pneumothorax is identified. No acute osseous abnormality is seen. IMPRESSION: Mild chronic bronchitic changes without evidence of acute abnormality. Electronically Signed   By: Sebastian Ache M.D.   On: 03/18/2017 10:01      Assessment & Plan  1.  Severe asthma attack 2.  History of nicotine abuse /history of iron deficiency anemia  Plan  Place in stepdown BiPAP IV steroids Nebulizer treatments P.o. Zithromax    DVT Prophylaxis Lovenox  AM Labs Ordered, also please review Full Orders    Code Status full  Disposition Plan: Home  Time spent in minutes : 46 minutes  Condition GUARDED   @SIGNATURE @

## 2017-03-19 DIAGNOSIS — J9601 Acute respiratory failure with hypoxia: Secondary | ICD-10-CM

## 2017-03-19 DIAGNOSIS — I10 Essential (primary) hypertension: Secondary | ICD-10-CM

## 2017-03-19 LAB — BASIC METABOLIC PANEL
ANION GAP: 11 (ref 5–15)
BUN: 6 mg/dL (ref 6–20)
CALCIUM: 9.5 mg/dL (ref 8.9–10.3)
CO2: 18 mmol/L — AB (ref 22–32)
CREATININE: 0.77 mg/dL (ref 0.44–1.00)
Chloride: 107 mmol/L (ref 101–111)
GFR calc Af Amer: >60 mL/min (ref >60)
GFR calc non Af Amer: >60 mL/min (ref >60)
GLUCOSE: 114 mg/dL — AB (ref 65–99)
Potassium: 3.6 mmol/L (ref 3.5–5.1)
Sodium: 136 mmol/L (ref 135–145)

## 2017-03-19 LAB — HIV ANTIBODY (ROUTINE TESTING W REFLEX): HIV Screen 4th Generation wRfx: NONREACTIVE

## 2017-03-19 MED ORDER — AZITHROMYCIN 250 MG PO TABS
250.0000 mg | ORAL_TABLET | Freq: Every day | ORAL | Status: DC
  Administered 2017-03-19 – 2017-03-21 (×3): 250 mg via ORAL
  Filled 2017-03-19 (×3): qty 1

## 2017-03-19 MED ORDER — GUAIFENESIN ER 600 MG PO TB12
600.0000 mg | ORAL_TABLET | Freq: Two times a day (BID) | ORAL | Status: DC
  Administered 2017-03-19 – 2017-03-21 (×5): 600 mg via ORAL
  Filled 2017-03-19 (×5): qty 1

## 2017-03-19 MED ORDER — MAGNESIUM SULFATE 2 GM/50ML IV SOLN
2.0000 g | Freq: Once | INTRAVENOUS | Status: AC
  Administered 2017-03-19: 2 g via INTRAVENOUS
  Filled 2017-03-19: qty 50

## 2017-03-19 NOTE — Progress Notes (Addendum)
Consult-"No Psychologist, clinicalnsurance" Financial counseling aware of patient and will complete evaluation.  Case Management following as well for PCP.   Vivi BarrackNicole Davarion Cuffee, Theresia MajorsLCSWA, MSW Clinical Social Worker  [phone] 03/19/2017  10:27 AM

## 2017-03-19 NOTE — Progress Notes (Signed)
PROGRESS NOTE  Rebecca Holmes JYN:[id number] DOB: 11/12/1975 DOA: 03/18/2017 PCP: Patient, No Pcp Per  HPI/Recap of past 24 hours:  Feeling better, less wheezing, less cough, no fever,  Remain tachycardic, significant dyspnea with minimal activity , currently on 2liter oxygen No edema, denies chest pain  Family at bedside  Assessment/Plan: Active Problems:   Asthma attack  Acute hypoxic respiratory failure, asthma exacerbation -No fever, flu swab negative, mrsa screening negative, chest x-ray no acute findings, no leukocytosis -Reviewed outpatient pulmonary records. per Dr. Elesa MassedWard, patient had normal lung function test in the past, patient continues to smoke cigarettes.  She had a history of asthma exacerbation requiring intubation 10 years ago -Patient reports lost her insurance several months ago, has not follow up with pmd or pulmonology -sHe was put on BiPAP and admitted to ICU due to significant tachypneic sinus tachycardia -Improving on IV Solu-Medrol, DuoNeb and Zithromax, on exam she has less wheezing, remains  significant tachypnea on minimal exertion, keep in stepdown today, will give iv mag    HTN Continue Cozaar  H/o systolic chf in 2010, has resolved since 2011. euvolemic on exam.  Depression Stable on Cymbalta Very pleasant  during the encounter today  Cigarette smoking Smoking cessation education provided  Code Status: full  Family Communication: patient and family at bedside with patient's permission  Disposition Plan: wean oxygen, likely able to transfer out of icu tomorrow, and home in 1-2 days   Consultants:  Case manager  Procedures:  bipap  Antibiotics:  zithromax   Objective: BP (!) 141/105   Pulse 98   Temp 98.2 F (36.8 C) (Axillary)   Resp (!) 22   Ht 5\' 7"  (1.702 m)   Wt 63.6 kg (140 lb 3.4 oz)   LMP 01/18/2017 (Approximate) Comment: shielded  SpO2 97%   BMI 21.96 kg/m   Intake/Output Summary (Last 24 hours) at 03/19/2017  1336 Last data filed at 03/19/2017 0900 Gross per 24 hour  Intake 730 ml  Output 500 ml  Net 230 ml   Filed Weights   03/18/17 1706  Weight: 63.6 kg (140 lb 3.4 oz)    Exam: Patient is examined daily including today on 03/19/2017, exams remain the same as of yesterday except that has changed    General:  NAD  Cardiovascular: less sinus tachycardia  Respiratory: diminished with bilateral lower lobe wheezing  Abdomen: Soft/ND/NT, positive BS  Musculoskeletal: No Edema  Neuro: alert, oriented   Data Reviewed: Basic Metabolic Panel: Recent Labs  Lab 03/18/17 0923 03/19/17 0258  NA 139 136  K 3.2* 3.6  CL 106 107  CO2 25 18*  GLUCOSE 86 114*  BUN 7 6  CREATININE 0.96 0.77  CALCIUM 9.1 9.5   Liver Function Tests: Recent Labs  Lab 03/18/17 0923  AST 21  ALT 12*  ALKPHOS 60  BILITOT 0.2*  PROT 7.6  ALBUMIN 4.2   No results for input(s): LIPASE, AMYLASE in the last 168 hours. No results for input(s): AMMONIA in the last 168 hours. CBC: Recent Labs  Lab 03/18/17 0923  WBC 7.8  NEUTROABS 4.6  HGB 12.2  HCT 37.2  MCV 85.9  PLT 170   Cardiac Enzymes:   No results for input(s): CKTOTAL, CKMB, CKMBINDEX, TROPONINI in the last 168 hours. BNP (last 3 results) Recent Labs    03/18/17 0923  BNP 42.3    ProBNP (last 3 results) No results for input(s): PROBNP in the last 8760 hours.  CBG: No results for input(s):  GLUCAP in the last 168 hours.  Recent Results (from the past 240 hour(s))  MRSA PCR Screening     Status: None   Collection Time: 03/18/17  5:16 PM  Result Value Ref Range Status   MRSA by PCR NEGATIVE NEGATIVE Final    Comment:        The GeneXpert MRSA Assay (FDA approved for NASAL specimens only), is one component of a comprehensive MRSA colonization surveillance program. It is not intended to diagnose MRSA infection nor to guide or monitor treatment for MRSA infections. Performed at [facility], [address], [place], [place] [address]      Studies: Dg Chest Portable 1 View  Result Date: 03/18/2017 CLINICAL DATA:  Cough and dyspnea EXAM: PORTABLE CHEST 1 VIEW COMPARISON:  10/08/2014 FINDINGS: The heart size and mediastinal contours are within normal limits. Both lungs are clear. The visualized skeletal structures are unremarkable. IMPRESSION: No active disease. Electronically Signed   By: Tollie Eth M.D.   On: 03/18/2017 14:40    Scheduled Meds: . DULoxetine  60 mg Oral q morning - 10a  . enoxaparin (LOVENOX) injection  40 mg Subcutaneous Q24H  . famotidine  20 mg Oral Daily  . guaiFENesin  600 mg Oral BID  . ipratropium-albuterol  3 mL Nebulization Q6H  . losartan  25 mg Oral Daily  . methylPREDNISolone (SOLU-MEDROL) injection  60 mg Intravenous Q6H  . sodium chloride flush  3 mL Intravenous Q12H    Continuous Infusions: . sodium chloride    . albuterol Stopped (03/18/17 1450)  . azithromycin Stopped (03/18/17 1900)  . magnesium sulfate 1 - 4 g bolus IVPB       Time spent: I have personally reviewed and interpreted on  03/19/2017 daily labs, tele strips, imagings as discussed above under date review session and assessment and plans.  I reviewed all nursing notes,  vitals, pertinent old records  I have discussed plan of care as described above with RN , patient and family on 03/19/2017   Albertine Grates MD, PhD  [facility] Pager [phone]. If 7PM-7AM, please contact night-coverage at [email or web address], password [facility] 03/19/2017, 1:36 PM  LOS: 1 day

## 2017-03-19 NOTE — Care Management Note (Signed)
Case Management Note  Patient Details  Name: Rebecca Holmes MRN: [id number] Date of Birth: 04/08/1975  Subjective/Objective:  CM referral for med asst,& pcp-spoke to patient-provided w/pcp listing-[facility] chosen-patient will call on own to set up pcp appt. Will need med asst-MATCH program to be used-will need manual script for meds. Informed patient of 1x use/12 calendar months/$3 co pay(can afford). If home 02 needed can arrange w/qualifying 02 sats documented, & home 02 order w/liter flow,duration,frequency.                  Action/Plan:d/c plan home.   Expected Discharge Date:  (unknown)               Expected Discharge Plan:  Home/Self Care  In-House Referral:     Discharge planning Services  CM Consult, [facility]igent [facility]  Post Acute Care Choice:    Choice offered to:     DME Arranged:    DME Agency:     HH Arranged:    HH Agency:     Status of Service:  In process, will continue to follow  If discussed at Long Length of Stay Meetings, dates discussed:    Additional Comments:  Lanier ClamMahabir, Elan Mcelvain, RN 03/19/2017, 3:53 PM

## 2017-03-20 DIAGNOSIS — J9621 Acute and chronic respiratory failure with hypoxia: Secondary | ICD-10-CM

## 2017-03-20 LAB — BASIC METABOLIC PANEL
Anion gap: 9 (ref 5–15)
BUN: 17 mg/dL (ref 6–20)
CHLORIDE: 105 mmol/L (ref 101–111)
CO2: 23 mmol/L (ref 22–32)
Calcium: 9.8 mg/dL (ref 8.9–10.3)
Creatinine, Ser: 1.05 mg/dL — ABNORMAL HIGH (ref 0.44–1.00)
GFR calc non Af Amer: >60 mL/min (ref >60)
Glucose, Bld: 113 mg/dL — ABNORMAL HIGH (ref 65–99)
POTASSIUM: 3.9 mmol/L (ref 3.5–5.1)
Sodium: 137 mmol/L (ref 135–145)

## 2017-03-20 MED ORDER — METHYLPREDNISOLONE SODIUM SUCC 125 MG IJ SOLR
60.0000 mg | Freq: Two times a day (BID) | INTRAMUSCULAR | Status: DC
  Administered 2017-03-20 – 2017-03-21 (×2): 60 mg via INTRAVENOUS
  Filled 2017-03-20 (×3): qty 2

## 2017-03-20 MED ORDER — IPRATROPIUM-ALBUTEROL 0.5-2.5 (3) MG/3ML IN SOLN
3.0000 mL | Freq: Two times a day (BID) | RESPIRATORY_TRACT | Status: DC
  Administered 2017-03-21: 3 mL via RESPIRATORY_TRACT
  Filled 2017-03-20: qty 3

## 2017-03-20 NOTE — Plan of Care (Signed)
  Nutrition: Adequate nutrition will be maintained 03/20/2017 1205 - Progressing by William Daltonarpenter, Antoinetta Berrones L, RN   Activity: Risk for activity intolerance will decrease 03/20/2017 1205 - Progressing by William Daltonarpenter, Jackie Russman L, RN   Safety: Ability to remain free from injury will improve 03/20/2017 1205 - Progressing by William Daltonarpenter, Letecia Arps L, RN

## 2017-03-20 NOTE — Progress Notes (Signed)
03/20/17 1203  Paged MD to notify of patient's 5 beat wide QRS called to nurse by tele monitor. Waiting for MD response, will continue to monitor.

## 2017-03-20 NOTE — Progress Notes (Signed)
[facility] PROGRESS NOTE    Progress Note  Rebecca Boresnnajo O Prasad  [id number] DOB: 08/21/1975 DOA: 03/18/2017 PCP: Patient, No Pcp Per     Brief Narrative:   Rebecca Holmes is an 42 y.o. female past medical history asthma with a history of intubation in 2010, heart failure with an EF of 55-60% in 2015, comes into the hospital for shortness of breath, fever and nonproductive cough  Assessment/Plan:   Acute on chronic respiratory failure with hypoxia due to Asthma attack: On admission was admitted to the ICU and placed on BiPAP Afebrile with a negative flu, chest x-ray showed no acute findings. Likely due to noncompliance, agree with IV steroids inhalers and antibiotics.  Decrease steroids to twice a day.  Transferred to regular floor.  Essential hypertension Continue losartan blood pressure is stable.  Depression, controlled Continue current home regimen no changes were made.  DVT prophylaxis: ambulation Family Communication:none Disposition Plan/Barrier to D/C: home in am Code Status:     Code Status Orders  (From admission, onward)        Start     Ordered   03/18/17 1706  Full code  Continuous     03/18/17 1705    Code Status History    Date Active Date Inactive Code Status Order ID Comments User Context   01/13/2013 16:45 01/15/2013 15:47 Full Code [id number]  Renae FickleShort, Mackenzie, MD ED        IV Access:    Peripheral IV   Procedures and diagnostic studies:   Dg Chest 2 View  Result Date: 03/18/2017 CLINICAL DATA:  Shortness of breath. Cough for 3 days. Current smoker. EXAM: CHEST - 2 VIEW COMPARISON:  10/08/2014 FINDINGS: The cardiomediastinal silhouette is within normal limits. There is mild peribronchial thickening which is similar to the prior study. No confluent airspace opacity, edema, pleural effusion, pneumothorax is identified. No acute osseous abnormality is seen. IMPRESSION: Mild chronic bronchitic changes without evidence of acute abnormality.  Electronically Signed   By: Sebastian AcheAllen  Grady M.D.   On: 03/18/2017 10:01   Dg Chest Portable 1 View  Result Date: 03/18/2017 CLINICAL DATA:  Cough and dyspnea EXAM: PORTABLE CHEST 1 VIEW COMPARISON:  10/08/2014 FINDINGS: The heart size and mediastinal contours are within normal limits. Both lungs are clear. The visualized skeletal structures are unremarkable. IMPRESSION: No active disease. Electronically Signed   By: Tollie Ethavid  Kwon M.D.   On: 03/18/2017 14:40     Medical Consultants:    None.  Anti-Infectives:   Oral azithromycin  Subjective:    [name] she relates her breathing is improved but not back to baseline.  Objective:    Vitals:   03/20/17 0000 03/20/17 0009 03/20/17 0431 03/20/17 0500  BP: 125/80   (!) 129/93  Pulse: 81   96  Resp: 16   17  Temp:  98 F (36.7 C) 98.1 F (36.7 C)   TempSrc:  Oral Oral   SpO2: 96%   (!) 88%  Weight:   65.3 kg (143 lb 15.4 oz)   Height:        Intake/Output Summary (Last 24 hours) at 03/20/2017 0753 Last data filed at 03/19/2017 1800 Gross per 24 hour  Intake 1080 ml  Output -  Net 1080 ml   Filed Weights   03/18/17 1706 03/20/17 0431  Weight: 63.6 kg (140 lb 3.4 oz) 65.3 kg (143 lb 15.4 oz)    Exam: General exam: In no acute distress. Respiratory system: Good air movement with wheezing  bilaterally and rhonchi Cardiovascular system: Mildly tachycardic with positive S1-S2 but regular, no appreciated JVD Gastrointestinal system: Abdomen is nondistended, soft and nontender.  Central nervous system: Weak alert and oriented x3 nonfocal Extremities: Lower extremity edema Skin: No rashes, lesions or ulcers Psychiatry: Judgement and insight appear normal. Mood & affect appropriate.    Data Reviewed:    Labs: Basic Metabolic Panel: Recent Labs  Lab 03/18/17 0923 03/19/17 0258 03/20/17 0337  NA 139 136 137  K 3.2* 3.6 3.9  CL 106 107 105  CO2 25 18* 23  GLUCOSE 86 114* 113*  BUN 7 6 17   CREATININE 0.96 0.77  1.05*  CALCIUM 9.1 9.5 9.8   GFR Estimated Creatinine Clearance: 68.6 mL/min (A) (by C-G formula based on SCr of 1.05 mg/dL (H)). Liver Function Tests: Recent Labs  Lab 03/18/17 0923  AST 21  ALT 12*  ALKPHOS 60  BILITOT 0.2*  PROT 7.6  ALBUMIN 4.2   No results for input(s): LIPASE, AMYLASE in the last 168 hours. No results for input(s): AMMONIA in the last 168 hours. Coagulation profile No results for input(s): INR, PROTIME in the last 168 hours.  CBC: Recent Labs  Lab 03/18/17 0923  WBC 7.8  NEUTROABS 4.6  HGB 12.2  HCT 37.2  MCV 85.9  PLT 170   Cardiac Enzymes: No results for input(s): CKTOTAL, CKMB, CKMBINDEX, TROPONINI in the last 168 hours. BNP (last 3 results) No results for input(s): PROBNP in the last 8760 hours. CBG: No results for input(s): GLUCAP in the last 168 hours. D-Dimer: No results for input(s): DDIMER in the last 72 hours. Hgb A1c: No results for input(s): HGBA1C in the last 72 hours. Lipid Profile: No results for input(s): CHOL, HDL, LDLCALC, TRIG, CHOLHDL, LDLDIRECT in the last 72 hours. Thyroid function studies: No results for input(s): TSH, T4TOTAL, T3FREE, THYROIDAB in the last 72 hours.  Invalid input(s): FREET3 Anemia work up: No results for input(s): VITAMINB12, FOLATE, FERRITIN, TIBC, IRON, RETICCTPCT in the last 72 hours. Sepsis Labs: Recent Labs  Lab 03/18/17 0923  WBC 7.8   Microbiology Recent Results (from the past 240 hour(s))  MRSA PCR Screening     Status: None   Collection Time: 03/18/17  5:16 PM  Result Value Ref Range Status   MRSA by PCR NEGATIVE NEGATIVE Final    Comment:        The GeneXpert MRSA Assay (FDA approved for NASAL specimens only), is one component of a comprehensive MRSA colonization surveillance program. It is not intended to diagnose MRSA infection nor to guide or monitor treatment for MRSA infections. Performed at [facility], [address] [place], [place]  [address]      Medications:   . azithromycin  250 mg Oral Daily  . DULoxetine  60 mg Oral q morning - 10a  . enoxaparin (LOVENOX) injection  40 mg Subcutaneous Q24H  . famotidine  20 mg Oral Daily  . guaiFENesin  600 mg Oral BID  . ipratropium-albuterol  3 mL Nebulization Q6H  . losartan  25 mg Oral Daily  . methylPREDNISolone (SOLU-MEDROL) injection  60 mg Intravenous Q6H  . sodium chloride flush  3 mL Intravenous Q12H   Continuous Infusions: . sodium chloride    . albuterol Stopped (03/18/17 1450)      LOS: 2 days   Marinda Elk  [facility] Pager [phone]  *Please refer to [email or web address], password [email or web address] to get updated schedule on who will round on this patient, as hospitalists  switch teams weekly. If 7PM-7AM, please contact night-coverage at [email or web address], password [email or web address] for any overnight needs.  03/20/2017, 7:53 AM

## 2017-03-20 NOTE — Progress Notes (Signed)
Pt walked entire hall twice, no sob, weakness noted. Pt voices no complaints at this time.

## 2017-03-21 MED ORDER — LOSARTAN POTASSIUM 25 MG PO TABS: 25.0000 mg | ORAL_TABLET | Freq: Every day | ORAL | 3 refills | Status: DC

## 2017-03-21 MED ORDER — SODIUM CHLORIDE 0.9 % IV SOLN
INTRAVENOUS | Status: DC
  Administered 2017-03-21: 11:00:00 via INTRAVENOUS

## 2017-03-21 MED ORDER — DULOXETINE HCL 60 MG PO CPEP: 60.0000 mg | ORAL_CAPSULE | Freq: Every morning | ORAL | 1 refills | Status: DC

## 2017-03-21 MED ORDER — FAMOTIDINE 20 MG PO TABS: 20.0000 mg | ORAL_TABLET | Freq: Every day | ORAL | 0 refills | Status: AC

## 2017-03-21 MED ORDER — PREDNISONE 10 MG PO TABS: ORAL_TABLET | ORAL | 0 refills | Status: DC

## 2017-03-21 MED ORDER — SODIUM CHLORIDE 0.9 % IV BOLUS (SEPSIS)
500.0000 mL | Freq: Once | INTRAVENOUS | Status: AC
  Administered 2017-03-21: 500 mL via INTRAVENOUS

## 2017-03-21 MED ORDER — BUDESONIDE-FORMOTEROL FUMARATE 160-4.5 MCG/ACT IN AERO: INHALATION_SPRAY | RESPIRATORY_TRACT | 0 refills | Status: DC

## 2017-03-21 MED ORDER — ALBUTEROL SULFATE HFA 108 (90 BASE) MCG/ACT IN AERS: 2.0000 | INHALATION_SPRAY | Freq: Four times a day (QID) | RESPIRATORY_TRACT | 2 refills | Status: DC | PRN

## 2017-03-21 MED FILL — SYMBICORT 160-4.5 MCG INH: 160-4.5 | 30 days supply | Qty: 10 | Fill #0

## 2017-03-21 MED FILL — DULoxetine HCL 60 MG CPEP: 60 | 34 days supply | Qty: 34 | Fill #0

## 2017-03-21 MED FILL — VENTOLIN HFA 90 MCG INHALER: 108 (90 BAS | 25 days supply | Qty: 18 | Fill #0

## 2017-03-21 MED FILL — predniSONE 10 MG TABS: 10 | 5 days supply | Qty: 15 | Fill #0

## 2017-03-21 MED FILL — FAMOTIDINE 20 MG TABLET: 20 | 14 days supply | Qty: 14 | Fill #0

## 2017-03-21 MED FILL — LOSARTAN POTASSIUM 25 MG TA: 25 | 30 days supply | Qty: 30 | Fill #0

## 2017-03-21 NOTE — Progress Notes (Signed)
This CM provided pt with a MATCH letter for discharge prescriptions. Sandford Crazeora Iriana Artley RN,BSN,NCM [phone]

## 2017-03-21 NOTE — Progress Notes (Signed)
Patient has discharged to home on 03/21/17. Discharge instructions including medications and appointments were given to patient. Patient has no question at this time.

## 2017-03-21 NOTE — Discharge Summary (Addendum)
Physician Discharge Summary  Rebecca Holmes ZOX:[id number] DOB: [birth year] DOA: 03/18/2017  PCP: Patient, No Pcp Per  Admit date: 03/18/2017 Discharge date: 03/21/2017  Admitted From: Home Disposition:  Home  Recommendations for Outpatient Follow-up:  1. Follow up with PCP in 1-2 weeks   Home Health:No Equipment/Devices:none  Discharge Condition:stable CODE STATUS:full Diet recommendation: Heart Healthy   Brief/Interim Summary: 42 y.o. female past medical history asthma with a history of intubation in 2010, heart failure with an EF of 55-60% in 2015, comes into the hospital for shortness of breath, fever and nonproductive cough  Discharge Diagnoses:  Active Problems:   Essential hypertension   Acute on chronic respiratory failure with hypoxia (HCC)   Depression, controlled   Asthma attack  Acute respiratory failure with hypoxia due to asthma attack: On admission she was placed on the stepdown on BiPAP she was started on IV steroids and inhalers. Chest x-ray shows no infiltrates she remained afebrile with no leukocytosis she would continue steroid tapers and inhalers at home.  Essential hypertension: No changes were made to her medication.  Depression, controlled: No changes were made to her medication.   Discharge Instructions  Discharge Instructions    Diet - low sodium heart healthy   Complete by:  As directed    Increase activity slowly   Complete by:  As directed      Allergies as of 03/21/2017      Reactions   Peanut-containing Drug Products Swelling   REACTION: swell up      Medication List    STOP taking these medications   azithromycin 250 MG tablet Commonly known as:  ZITHROMAX   clindamycin 75 MG/5ML solution Commonly known as:  CLEOCIN     TAKE these medications   albuterol (2.5 MG/3ML) 0.083% nebulizer solution Commonly known as:  PROVENTIL Take 3 mLs (2.5 mg total) by nebulization every 6 (six) hours as needed for wheezing or shortness of  breath.   albuterol 108 (90 Base) MCG/ACT inhaler Commonly known as:  PROAIR HFA Inhale 2 puffs into the lungs every 6 (six) hours as needed for wheezing or shortness of breath.   budesonide-formoterol 160-4.5 MCG/ACT inhaler Commonly known as:  SYMBICORT INHALE 2 PUFFS FIRST THING IN THE MORNING AND THEN 12 HOURS LATER   diclofenac sodium 1 % Gel Commonly known as:  VOLTAREN Apply 2 g topically 4 (four) times daily.   diphenhydrAMINE 25 MG tablet Commonly known as:  BENADRYL Take 1 tablet (25 mg total) by mouth every 6 (six) hours.   DULoxetine 60 MG capsule Commonly known as:  CYMBALTA Take 1 capsule (60 mg total) by mouth every morning.   EPIPEN 2-PAK 0.3 mg/0.3 mL Soaj injection Generic drug:  EPINEPHrine Inject 1 Device into the muscle daily as needed. For nut allergy   famotidine 20 MG tablet Commonly known as:  PEPCID Take 1 tablet (20 mg total) by mouth daily.   HYDROcodone-acetaminophen 7.5-325 mg/15 ml solution Commonly known as:  HYCET Take 5 mLs by mouth every 6 (six) hours as needed for moderate pain or severe pain.   loratadine 10 MG tablet Commonly known as:  CLARITIN Take 1 tablet (10 mg total) by mouth daily.   losartan 25 MG tablet Commonly known as:  COZAAR Take 1 tablet (25 mg total) by mouth daily.   predniSONE 10 MG tablet Commonly known as:  DELTASONE Takes 5 tablets for 1 days, then 4 tablets for 1 days, then 3 tablets for 1 days, then 2 tabs  for 1 days, then 1 tab for 1 days, and then stop. What changed:    medication strength  how much to take  how to take this  when to take this  additional instructions      Follow-up Information    [facility] Follow up.   Specialty:  Internal Medicine Why:  You can choose this office to become your doctor without insurance. Contact information: [address] [place] [id number] [phone]       [facility] Follow up.    Why:  You can choose this office to become your doctor without insurance.  Once you make an appointment at 1 of these 2 clinics, you can use this location for your PHARMACY needs and for FINANCIAL COUNSELING. Contact information: [address] [place] [phone]         Allergies  Allergen Reactions  . Peanut-Containing Drug Products Swelling    REACTION: swell up    Consultations: None  Procedures/Studies: Dg Chest 2 View  Result Date: 03/18/2017 CLINICAL DATA:  Shortness of breath. Cough for 3 days. Current smoker. EXAM: CHEST - 2 VIEW COMPARISON:  10/08/2014 FINDINGS: The cardiomediastinal silhouette is within normal limits. There is mild peribronchial thickening which is similar to the prior study. No confluent airspace opacity, edema, pleural effusion, pneumothorax is identified. No acute osseous abnormality is seen. IMPRESSION: Mild chronic bronchitic changes without evidence of acute abnormality. Electronically Signed   By: Sebastian AcheAllen  Grady M.D.   On: 03/18/2017 10:01   Dg Chest Portable 1 View  Result Date: 03/18/2017 CLINICAL DATA:  Cough and dyspnea EXAM: PORTABLE CHEST 1 VIEW COMPARISON:  10/08/2014 FINDINGS: The heart size and mediastinal contours are within normal limits. Both lungs are clear. The visualized skeletal structures are unremarkable. IMPRESSION: No active disease. Electronically Signed   By: Tollie Ethavid  Kwon M.D.   On: 03/18/2017 14:40    Subjective: She is off oxygen she relates her breathing is significantly improved compared to the prior day.  Discharge Exam: Vitals:   03/21/17 0422 03/21/17 0827  BP: (!) 126/96   Pulse: 88   Resp: 18   Temp: 98.5 F (36.9 C)   SpO2: 95% 98%   Vitals:   03/20/17 2322 03/21/17 0005 03/21/17 0422 03/21/17 0827  BP: (!) 130/100 128/90 (!) 126/96   Pulse: 96  88   Resp: 18  18   Temp: 98.3 F (36.8 C)  98.5 F (36.9 C)   TempSrc: Oral  Oral   SpO2: 98%  95% 98%  Weight:       Height:        General: Pt is alert, awake, not in acute distress Cardiovascular: RRR, S1/S2 +, no rubs, no gallops Respiratory: CTA bilaterally, no wheezing, no rhonchi Abdominal: Soft, NT, ND, bowel sounds + Extremities: no edema, no cyanosis    The results of significant diagnostics from this hospitalization (including imaging, microbiology, ancillary and laboratory) are listed below for reference.     Microbiology: Recent Results (from the past 240 hour(s))  MRSA PCR Screening     Status: None   Collection Time: 03/18/17  5:16 PM  Result Value Ref Range Status   MRSA by PCR NEGATIVE NEGATIVE Final    Comment:        The GeneXpert MRSA Assay (FDA approved for NASAL specimens only), is one component of a comprehensive MRSA colonization surveillance program. It is not intended to diagnose  MRSA infection nor to guide or monitor treatment for MRSA infections. Performed at [facility], [address] [place], [place] [address]      Labs: BNP (last 3 results) Recent Labs    03/18/17 0923  BNP 42.3   Basic Metabolic Panel: Recent Labs  Lab 03/18/17 0923 03/19/17 0258 03/20/17 0337  NA 139 136 137  K 3.2* 3.6 3.9  CL 106 107 105  CO2 25 18* 23  GLUCOSE 86 114* 113*  BUN 7 6 17   CREATININE 0.96 0.77 1.05*  CALCIUM 9.1 9.5 9.8   Liver Function Tests: Recent Labs  Lab 03/18/17 0923  AST 21  ALT 12*  ALKPHOS 60  BILITOT 0.2*  PROT 7.6  ALBUMIN 4.2   No results for input(s): LIPASE, AMYLASE in the last 168 hours. No results for input(s): AMMONIA in the last 168 hours. CBC: Recent Labs  Lab 03/18/17 0923  WBC 7.8  NEUTROABS 4.6  HGB 12.2  HCT 37.2  MCV 85.9  PLT 170   Cardiac Enzymes: No results for input(s): CKTOTAL, CKMB, CKMBINDEX, TROPONINI in the last 168 hours. BNP: Invalid input(s): POCBNP CBG: No results for input(s): GLUCAP in the last 168 hours. D-Dimer No results for input(s): DDIMER in the last 72  hours. Hgb A1c No results for input(s): HGBA1C in the last 72 hours. Lipid Profile No results for input(s): CHOL, HDL, LDLCALC, TRIG, CHOLHDL, LDLDIRECT in the last 72 hours. Thyroid function studies No results for input(s): TSH, T4TOTAL, T3FREE, THYROIDAB in the last 72 hours.  Invalid input(s): FREET3 Anemia work up No results for input(s): VITAMINB12, FOLATE, FERRITIN, TIBC, IRON, RETICCTPCT in the last 72 hours. Urinalysis    Component Value Date/Time   COLORURINE YELLOW 12/18/2011 0845   APPEARANCEUR CLEAR 12/18/2011 0845   LABSPEC 1.029 12/18/2011 0845   PHURINE 5.5 12/18/2011 0845   GLUCOSEU NEG 12/18/2011 0845   HGBUR SMALL (A) 12/18/2011 0845   BILIRUBINUR NEG 12/18/2011 0845   KETONESUR NEG 12/18/2011 0845   PROTEINUR NEG 12/18/2011 0845   UROBILINOGEN 0.2 12/18/2011 0845   NITRITE POS (A) 12/18/2011 0845   LEUKOCYTESUR NEG 12/18/2011 0845   Sepsis Labs Invalid input(s): PROCALCITONIN,  WBC,  LACTICIDVEN Microbiology Recent Results (from the past 240 hour(s))  MRSA PCR Screening     Status: None   Collection Time: 03/18/17  5:16 PM  Result Value Ref Range Status   MRSA by PCR NEGATIVE NEGATIVE Final    Comment:        The GeneXpert MRSA Assay (FDA approved for NASAL specimens only), is one component of a comprehensive MRSA colonization surveillance program. It is not intended to diagnose MRSA infection nor to guide or monitor treatment for MRSA infections. Performed at [facility], [address], [place], [place] [address]      Time coordinating discharge: Over 30 minutes  SIGNED:   Marinda Elk, MD  [facility] 03/21/2017, 10:37 AM Pager   If 7PM-7AM, please contact night-coverage [email or web address] Password [email or web address]

## 2017-05-06 ENCOUNTER — Encounter: Payer: Self-pay | Admitting: Internal Medicine

## 2017-05-06 ENCOUNTER — Ambulatory Visit (INDEPENDENT_AMBULATORY_CARE_PROVIDER_SITE_OTHER): Payer: Medicaid Other | Admitting: Internal Medicine

## 2017-05-06 VITALS — BP 138/80 | HR 92 | Ht 67.0 in | Wt 153.0 lb

## 2017-05-06 DIAGNOSIS — J4530 Mild persistent asthma, uncomplicated: Secondary | ICD-10-CM

## 2017-05-06 DIAGNOSIS — F17210 Nicotine dependence, cigarettes, uncomplicated: Secondary | ICD-10-CM | POA: Diagnosis not present

## 2017-05-06 NOTE — Patient Instructions (Addendum)
Plan A = Automatic = Symbicort 160 Take 2 puffs first thing in am and then another 2 puffs about 12 hours later.   Work on inhaler technique:  relax and gently blow all the way out then take a nice smooth deep breath back in, triggering the inhaler at same time you start breathing in.  Hold for up to 5 seconds if you can. Blow out thru nose. Rinse and gargle with water when done      Plan B = Backup Only use your albuterol as a rescue medication to be used if you can't catch your breath by resting or doing a relaxed purse lip breathing pattern.  - The less you use it, the better it will work when you need it. - Ok to use the inhaler up to 2 puffs  every 4 hours if you must but call for appointment if use goes up over your usual need - Don't leave home without it !!  (think of it like the spare tire for your car)    Plan C = Crisis - only use your albuterol nebulizer if you first try Plan B and it fails to help > ok to use the nebulizer up to every 4 hours but if start needing it regularly call for immediate appointment   Please schedule a follow up office visit in 4 weeks, sooner if needed  with all medications /inhalers/ solutions in hand so we can verify exactly what you are taking. This includes all medications from all doctors and over the counters  -with PFTs on return

## 2017-05-06 NOTE — Progress Notes (Signed)
Subjective:    Patient ID: Rebecca Holmes, female    DOB: [birth year]    MRN: [id number]    Brief patient profile:  42 yobf active smoker with childhood asthma never completely resolved and required ET 2010 last seen in pulmonary clinic 2011 with nl pfts and despite continue smoking since then had completely nl pfts p saba again 05/11/2015       05/11/2015  f/u ov/Rebecca Holmes re: mild chronic asthma/ maint rx symb 160 2bidobf active smoker with childhood asthma never completely resolved and required ET 2010 last seen in pulmonary clinic 2011 with nl pfts and despite continue smoking since then had completely nl pfts p saba again 05/11/2015       05/11/2015  f/u ov/Rebecca Holmes re: mild chronic asthma/ maint rx symb 160 2bid  Chief Complaint  Patient presents with  . Follow-up    PFT done today. Pt states that her breathing is overall doing well. She is using proair 2 x per wk on average and has not needed neb.   rarely needs saba as long as has symb 160 despite active smoking rec Plan A = Automatic = Symbicort 160 Take 2 puffs first thing in am and then another 2 puffs about 12 hours later.  The key is to stop smoking completely before smoking completely stops you!  Work on inhaler technique:  relax and gently blow all the way out then take a nice smooth deep breath back in, triggering the inhaler at same time you start breathing in.  Hold for up to 5 seconds if you can. Blow out thru nose. Rinse and gargle with water when done Plan B = Backup Only use your albuterol (Proair) as a rescue medication Plan C = Crisis - only use your albuterol nebulizer if you first try Plan B and it fails to help > ok to use the nebulizer up to every 4 hours but if start needing it regularly call for immediate appointment    Admit date: 03/18/2017 Discharge date: 03/21/2017    Brief/Interim Summary: 42 y.o.femalepast medical history asthma with a history of intubation in 2010, heart failure with an EF of 55-60% in 2015, comes into the hospital for shortness of breath, fever and nonproductive coughpast medical history asthma with a history of intubation in 2010, heart failure with an EF of 55-60% in 2015, comes into the hospital for shortness of breath, fever and nonproductive cough  Discharge Diagnoses:  Active Problems:   Essential hypertension   Acute on chronic respiratory failure with hypoxia (HCC)   Depression, controlled   Asthma attack  Acute respiratory failure with hypoxia due  to asthma attack: On admission she was placed on the stepdown on BiPAP she was started on IV steroids and inhalers. Chest x-ray shows no infiltrates she remained afebrile with no leukocytosis she would continue steroid tapers and inhalers at home.  Essential hypertension: No changes were made to her medication.  Depression, controlled: No changes were made to her medication.   Discharge Instructions      Discharge Instructions    Diet - low sodium heart healthy   Complete by:  As directed    Increase activity slowly   Complete by:  As directed          Allergies as of 03/21/2017      Reactions   Peanut-containing Drug Products Swelling   REACTION: swell up              Medication List     STOP taking these medications   azithromycin 250 MG tablet Commonly known as:  ZITHROMAX   clindamycin 75 MG/5ML solution Commonly known as:  CLEOCIN     TAKE these medications   albuterol (2.5 MG/3ML) 0.083% nebulizer solution Commonly known as:  PROVENTIL Take 3 mLs (2.5 mg total) by nebulization every 6 (six) hours as needed for wheezing or shortness of breath.   albuterol 108 (90 Base) MCG/ACT inhaler Commonly known as:  PROAIR HFA Inhale 2 puffs  into the lungs every 6 (six) hours as needed for wheezing or shortness of breath.   budesonide-formoterol 160-4.5 MCG/ACT inhaler Commonly known as:  SYMBICORT INHALE 2 PUFFS FIRST THING IN THE MORNING AND THEN 12 HOURS LATER   diclofenac sodium 1 % Gel Commonly known as:  VOLTAREN Apply 2 g topically 4 (four) times daily.   diphenhydrAMINE 25 MG tablet Commonly known as:  BENADRYL Take 1 tablet (25 mg total) by mouth every 6 (six) hours.   DULoxetine 60 MG capsule Commonly known as:  CYMBALTA Take 1 capsule (60 mg total) by mouth every morning.   EPIPEN 2-PAK 0.3 mg/0.3 mL Soaj injection Generic drug:  EPINEPHrine Inject 1 Device into the muscle daily as needed. For nut allergy   famotidine  20 MG tablet Commonly known as:  PEPCID Take 1 tablet (20 mg total) by mouth daily.   HYDROcodone-acetaminophen 7.5-325 mg/15 ml solution Commonly known as:  HYCET Take 5 mLs by mouth every 6 (six) hours as needed for moderate pain or severe pain.   loratadine 10 MG tablet Commonly known as:  CLARITIN Take 1 tablet (10 mg total) by mouth daily.   losartan 25 MG tablet Commonly known as:  COZAAR Take 1 tablet (25 mg total) by mouth daily.   predniSONE 10 MG tablet Commonly known as:  DELTASONE Takes 5 tablets for 1 days, then 4 tablets for 1 days, then 3 tablets for 1 days, then 2 tabs for 1 days, then 1 tab for 1 days, and then stop. What changed:    medication strength  how much to take  how to take this  when to take this  additional instructions         05/06/2017  f/u ov/Rebecca Holmes re:  Chronic asthma/ 1 cig q 4 days Chief Complaint  Patient presents with  . Hospitalization Follow-up    admitted to hospital with asthma flare 03/18/17 to 03/21/17. She states her breathing seems okay today. She does get worse with warmer weather. She is not using her albuterol inhaler, but is using neb every day at least 2 x daily and occ wakes up in the night and has to use.   doe =  MMRC3 = can't walk 100 yards even at a slow pace at a flat grade s stopping due to sob / legs hurting  Cough better now Sleeping trouble lying flat due to sob p several min  Misunderstood how to use maint vs prns and still overusing saba  Admits breahting was going downhill x 3 weeks prior to req admit and did not contact this office as instructed when saba use increased.  No obvious patterns in day to day or daytime variability or assoc excess/ purulent sputum or mucus plugs or hemoptysis or cp or chest tightness, subjective wheeze or overt sinus or hb symptoms. No unusual exposure hx or h/o childhood pna  or knowledge of premature birth.   Also denies any obvious fluctuation of symptoms with weather or  environmental changes or other aggravating or alleviating factors except as outlined above   Current Allergies, Complete Past Medical History, Past Surgical History, Family History, and Social History were reviewed in [employer] record.  ROS  The following are not active complaints unless bolded Hoarseness, sore throat, dysphagia, dental problems, itching, sneezing,  nasal congestion or discharge of excess mucus or purulent secretions, ear ache,   fever, chills, sweats, unintended wt loss or wt gain, classically pleuritic or exertional cp,  orthopnea pnd  or arm/hand swelling  or leg swelling, presyncope, palpitations, abdominal pain, anorexia, nausea, vomiting, diarrhea  or change in bowel habits or change in bladder habits, change in stools or change in urine, dysuria, hematuria,  rash, arthralgias, visual complaints, headache, numbness, weakness or ataxia or problems with walking or coordination,  change in mood or  memory.        Current Meds - not able to verify taking meds correctly   Medication Sig  . albuterol (PROVENTIL) (2.5 MG/3ML) 0.083% nebulizer solution Take 3 mLs (2.5 mg total) by nebulization every 6 (six) hours as needed for wheezing or shortness of breath.  Marland Kitchen albuterol (VENTOLIN HFA) 108 (90 Base) MCG/ACT inhaler Inhale 2 puffs into the lungs every 6 (six) hours as needed for wheezing or shortness of breath.  . budesonide-formoterol (SYMBICORT) 160-4.5 MCG/ACT inhaler INHALE 2 PUFFS FIRST THING IN THE MORNING AND THEN 12 HOURS LATER  . diclofenac sodium (VOLTAREN) 1 % GEL Apply 2 g topically 4 (four) times daily.  . diphenhydrAMINE (BENADRYL) 25 MG tablet Take 1 tablet (25 mg total) by mouth every 6 (six) hours.  . DULoxetine (CYMBALTA) 60 MG capsule Take 1 capsule (60 mg total) by mouth every morning.  Marland Kitchen EPIPEN 2-PAK 0.3 MG/0.3ML SOAJ injection Inject 1 Device into the muscle daily as needed. For nut allergy  . famotidine (PEPCID) 20 MG tablet Take 1 tablet  (20 mg total) by mouth daily.  Marland Kitchen loratadine (CLARITIN) 10 MG tablet Take 1 tablet (10 mg total) by mouth daily.  Marland Kitchen losartan (COZAAR) 25 MG tablet Take 1 tablet (25 mg total) by mouth daily.                   Objective:   Physical Exam  amb bf nad    05/06/2017        153  03/08/2015        161     12/31/14 155 lb (70.308 kg)  06/08/14 164 lb 4.8 oz (74.526 kg)  10/13/13 172 lb 12.8 oz (78.382 kg)     Vital signs reviewed - Note on arrival 02 sats  92% on RA   HEENT: nl dentition, turbinates bilaterally, and oropharynx. Nl external ear canals without cough reflex   NECK :  without JVD/Nodes/TM/ nl carotid upstrokes bilaterally   LUNGS: no acc muscle use,  Nl contour chest which is clear to A and P bilaterally without cough on insp or exp maneuvers   CV:  RRR  no s3 or murmur or increase in P2, and no edema   ABD:  soft and nontender with nl inspiratory excursion in the supine position. No bruits or organomegaly appreciated, bowel sounds nl  MS:  Nl gait/ ext warm without deformities, calf tenderness, cyanosis or clubbing No obvious joint restrictions   SKIN: warm and dry without lesions    NEURO:  alert, approp, nl sensorium with  no motor or cerebellar deficits apparent.           I personally reviewed images and agree with radiology impression as follows:  CXR:   03/18/17 Mild chronic bronchitic changes without evidence of acute abnormality      Assessment & Plan:

## 2017-05-07 ENCOUNTER — Encounter: Payer: Self-pay | Admitting: Internal Medicine

## 2017-05-07 NOTE — Assessment & Plan Note (Addendum)
Required ET 2010  -  PFT's  02/11/2015   FEV1 2.16 (76 % ) ratio 66  p 37 % improvement from saba with DLCO  52 % corrects to 69 % for alv volume   - 05/11/2015  extensive coaching HFA effectiveness =    90%   - PFT's  05/11/2015  FEV1 2.61 (92 % ) ratio 70  p 19 % improvement from saba p no rx  prior to study with DLCO  60 % corrects to 6581 % for alv volume   - 05/06/2017  After extensive coaching inhaler device  effectiveness =    75% from a baseline of 50%   Recurrent avoidable admit represents   difficult airways management  With ddx:  almost all start with A and  include Adherence, Ace Inhibitors, Acid Reflux, Active Sinus Disease, Alpha 1 Antitripsin deficiency, Anxiety masquerading as Airways dz,  ABPA,  Allergy(esp in young), Aspiration (esp in elderly), Adverse effects of meds,  Active smokers, A bunch of PE's (a small clot burden can't cause this syndrome unless there is already severe underlying pulm or vascular dz with poor reserve) plus two Bs  = Bronchiectasis and Beta blocker use..and one C= CHF     Adherence is always the initial "prime suspect" and is a multilayered concern that requires a "trust but verify" approach in every patient - starting with knowing how to use medications, especially inhalers, correctly, keeping up with refills and understanding the fundamental difference between maintenance and prns vs those medications only taken for a very short course and then stopped and not refilled.  - see hfa teaching - return with all meds in hand using a trust but verify approach to confirm accurate Medication  Reconciliation The principal here is that until we are certain that the  patients are doing what we've asked, it makes no sense to ask them to do more.    Active smoking (see separate a/p)    ? Acid (or non-acid) GERD > always difficult to exclude as up to 75% of pts in some series report no assoc GI/ Heartburn symptoms>  Continue pepcid for now, change to ppi ac and h2 hs if not  improving or having freq flare of cough/ sore throat or "wheezing"   ? Allergy > high dose ics should cover this fine esp if stops all smoking  ? Anxiety > usually at the bottom of this list of usual suspects but should be   higher on this pt's based on H and P and note already on psychotropics and may interfere with adherence and also interpretation of response or lack thereof to symptom management which can be quite subjective.    I had an extended discussion with the patient reviewing all relevant studies completed to date and  lasting 25 minutes of a 40  minute transition of care office visit following avoidable admit  re  severe non-specific but potentially very serious refractory respiratory symptoms of uncertain and potentially multiple  etiologies.   Each maintenance medication was reviewed in detail including most importantly the difference between maintenance and prns and under what circumstances the prns are to be triggered using an action plan format that is not reflected in the computer generated alphabetically organized AVS.    Please see AVS for specific instructions unique to this office visit that I personally wrote and verbalized to the the pt in detail and then reviewed with pt  by my nurse highlighting any changes in therapy/plan of care  recommended at today's visit.

## 2017-05-07 NOTE — Assessment & Plan Note (Signed)
>   3 min Discussed the risks and costs (both direct and indirect)  of smoking relative to the benefits of quitting but patient unwilling to commit at this point to a specific quit date.      Advised if  Truly only smoking one cig every 4 days this is hardly a habit or nicotene addiction she can't break  - says has to smoke to control nerves from taking care of kids > already on cymbalta > Follow up per Primary Care planned

## 2017-05-30 ENCOUNTER — Other Ambulatory Visit: Payer: Self-pay

## 2017-05-30 ENCOUNTER — Ambulatory Visit (HOSPITAL_COMMUNITY)
Admission: RE | Admit: 2017-05-30 | Discharge: 2017-05-30 | Disposition: A | Discharge status: Home / Self Care | Payer: Medicaid Other | Source: Ambulatory Visit | Attending: Family Medicine | Admitting: Family Medicine

## 2017-05-30 ENCOUNTER — Ambulatory Visit (INDEPENDENT_AMBULATORY_CARE_PROVIDER_SITE_OTHER): Payer: Medicaid Other | Admitting: Internal Medicine

## 2017-05-30 VITALS — BP 119/88 | HR 88 | Temp 98.1°F | Ht 67.0 in | Wt 147.9 lb

## 2017-05-30 DIAGNOSIS — I4581 Long QT syndrome: Secondary | ICD-10-CM | POA: Diagnosis not present

## 2017-05-30 DIAGNOSIS — I509 Heart failure, unspecified: Secondary | ICD-10-CM

## 2017-05-30 DIAGNOSIS — F329 Major depressive disorder, single episode, unspecified: Secondary | ICD-10-CM

## 2017-05-30 DIAGNOSIS — I4510 Unspecified right bundle-branch block: Secondary | ICD-10-CM | POA: Insufficient documentation

## 2017-05-30 DIAGNOSIS — R009 Unspecified abnormalities of heart beat: Secondary | ICD-10-CM | POA: Insufficient documentation

## 2017-05-30 DIAGNOSIS — N921 Excessive and frequent menstruation with irregular cycle: Secondary | ICD-10-CM | POA: Diagnosis not present

## 2017-05-30 DIAGNOSIS — R008 Other abnormalities of heart beat: Secondary | ICD-10-CM | POA: Diagnosis not present

## 2017-05-30 DIAGNOSIS — I444 Left anterior fascicular block: Secondary | ICD-10-CM | POA: Insufficient documentation

## 2017-05-30 DIAGNOSIS — Z79899 Other long term (current) drug therapy: Secondary | ICD-10-CM

## 2017-05-30 DIAGNOSIS — I499 Cardiac arrhythmia, unspecified: Secondary | ICD-10-CM | POA: Insufficient documentation

## 2017-05-30 DIAGNOSIS — F32A Depression, unspecified: Secondary | ICD-10-CM

## 2017-05-30 MED ORDER — DULOXETINE HCL 30 MG PO CPEP: 30.0000 mg | ORAL_CAPSULE | Freq: Every day | ORAL | 1 refills | Status: AC

## 2017-05-30 MED ORDER — LOSARTAN POTASSIUM 25 MG PO TABS: 25.0000 mg | ORAL_TABLET | Freq: Every day | ORAL | 3 refills | Status: DC

## 2017-05-30 MED ORDER — LOSARTAN POTASSIUM 25 MG PO TABS: 25.0000 mg | ORAL_TABLET | Freq: Every day | ORAL | 3 refills | Status: AC

## 2017-05-30 NOTE — Progress Notes (Signed)
CC: Vaginal bleeding  HPI:  [name] is a 42 y.o. female with PMHx detailed below presenting with 2 months of markedly increased vaginal bleeding. Her last 3 periods have been prolonged for about 2 weeks with increased bleeding using up to 1/2 a pack of pads daily. She has a long history of menorrhagia that was previously treated with depo-provera shots but she has not been on chemical contraception for many months at this time. There is no significant pain during the bleeding. She has had some floating spots in her vision but no lightheadedness or weakness.  See problem based assessment and plan below for additional details.  Menorrhagia with irregular cycle He is a history of previous menorrhagia that was controlled in the past with Depo-Provera shots.  She is currently on no chemical contraceptive but has been having increasing volume and irregularity of vaginal bleeding for the past 3 months.  She has not had any gynecology follow-up for years.  She has some mild to moderate lower abdominal pain associated with this.  She is up-to-date for Pap smear with last in 07/2013 with negative HPV co-testing. Plan: We will check CBC and ferritin today Will order for transvaginal ultrasound Plan refer to gynecology for follow-up if symptoms not improving once we get ultrasound results  Irregular heart rate Her rate was abnormal but a regular rate on exam today.  She denies any significant palpitations chest pain or shortness of breath.  She does not recall ever having known cardiac arrhythmias. An EKG was obtained in clinic that showed a trigeminy pattern with every third beat appearing to be a premature beat without variation or dropped P waves across the strip.  There is no evidence of atrial fibrillation or heart block.  Congestive heart failure She was documented to have previously severely reduced EF in 2010 but subsequently back to normal in 2015.  The abnormal study was during a  severe exacerbation with endotracheal intubation required for status asthmaticus.  She currently has no significant dyspnea on exertion, weight gain, or lower extremity swelling.  Depression Her mood is depressed today with mild to moderate interference and daily activity and no suicidal ideation.  This is a worsened due to current life stressors.  She is not taking the Cymbalta due to excessive drowsiness on a 60 mg dose.  She had previously been on a regimen of Cymbalta and Seroquel in the past. Plan: Restart Cymbalta at 30 mg daily Follow-up within 4 weeks   Past Medical History:  Diagnosis Date  . Anemia, iron deficiency   . Asthma    vs Pseudoasthma from ACE; Trial off ACE > PFT's normal 09/09/09 x DLCO 59 > corrects to 73%  . Depression   . Heart murmur   . HTN (hypertension)    Try off ACE 06/13/2009 due to pseudoasthma  . Insomnia   . Left ventricular dysfunction    Decreased; Low E 20-25% in the setting of severe illness requiring ICU care 06/2008; Cardiolyte 06/13/2009 normal perfusion w/o significant ischemia or scar; LVEF 38%  . Respiratory failure (HCC)    Ventilator Dependent; 2/2 status asthmatics 6/11-18/2010 admit [facility]  . Vision disorder     Review of Systems: Review of Systems  Constitutional: Negative for chills and fever.  Eyes: Negative for blurred vision.  Respiratory: Positive for cough. Negative for sputum production.   Cardiovascular: Negative for chest pain and leg swelling.  Gastrointestinal: Negative for blood in stool.  Genitourinary: Negative for hematuria.  Heavy menstrual bleeding  Musculoskeletal: Positive for neck pain.  Skin: Negative for rash.  Neurological: Negative for sensory change.  Psychiatric/Behavioral: Positive for depression. Negative for suicidal ideas.     Physical Exam: Vitals:   05/30/17 1501  BP: 119/88  Pulse: 88  Temp: 98.1 F (36.7 C)  TempSrc: Oral  SpO2: 97%  Weight: 147 lb 14.4 oz (67.1 kg)  Height:  (1.702  m)   GENERAL- alert, co-operative, NAD HEENT- Atraumatic, PERRL, oral mucosa appears moist, no cervical LN enlargement. CARDIAC- Regular rate with irregular rhythm, no murmur RESP- CTAB, no wheezes or crackles. ABDOMEN- Mild tenderness in suprapubic area right of midline BACK- Normal curvature, no paraspinal tenderness, no CVA tenderness. NEURO- Lower extremity strength 5/5 bilaterally, normal symmetric reflexes, Sensation intact globally EXTREMITIES- symmetric, no pedal edema. SKIN- Warm, dry, No rash or lesion. PSYCH- Normal mood and affect, appropriate thought content and speech.   Assessment & Plan:   See encounters tab for problem based medical decision making.   Patient discussed with Dr. Heide Spark

## 2017-05-30 NOTE — Patient Instructions (Signed)
We will check some blood work today to make sure you are not developing anemia from this increased bleeding or iron deficiency.  We will also check a pregnancy test to make sure.  I think we need to get a ultrasound of your uterus and pelvis to make sure there is not an abnormality causing this bleeding.  I will order this today and will plan on getting it when you can follow-up with a gynecologist either to manage the findings or discuss other possibilities.  Let us try decreasing your Cymbalta to a lower dose so that you can take it without feeling to drowsy.  Your heart rate is a bit irregular today but not in any way that requires treatment.

## 2017-05-31 LAB — BMP8+ANION GAP
ANION GAP: 13 mmol/L (ref 10.0–18.0)
BUN/Creatinine Ratio: 8 — ABNORMAL LOW (ref 9–23)
BUN: 8 mg/dL (ref 6–24)
CALCIUM: 9 mg/dL (ref 8.7–10.2)
CHLORIDE: 105 mmol/L (ref 96–106)
CO2: 24 mmol/L (ref 20–29)
Creatinine, Ser: 0.96 mg/dL (ref 0.57–1.00)
GFR, EST AFRICAN AMERICAN: 85 mL/min/{1.73_m2} (ref >59)
GFR, EST NON AFRICAN AMERICAN: 74 mL/min/{1.73_m2} (ref >59)
GLUCOSE: 77 mg/dL (ref 65–99)
Potassium: 3.8 mmol/L (ref 3.5–5.2)
Sodium: 142 mmol/L (ref 134–144)

## 2017-05-31 LAB — CBC
Hematocrit: 35.6 % (ref 34.0–46.6)
Hemoglobin: 11.7 g/dL (ref 11.1–15.9)
MCH: 27.3 pg (ref 26.6–33.0)
MCHC: 32.9 g/dL (ref 31.5–35.7)
MCV: 83 fL (ref 79–97)
Platelets: 148 10*3/uL — ABNORMAL LOW (ref 150–450)
RBC: 4.28 x10E6/uL (ref 3.77–5.28)
RDW: 17.4 % — AB (ref 12.3–15.4)
WBC: 5.7 10*3/uL (ref 3.4–10.8)

## 2017-05-31 LAB — FERRITIN: Ferritin: 12 ng/mL — ABNORMAL LOW (ref 15–150)

## 2017-05-31 LAB — HCG, SERUM, QUALITATIVE: hCG,Beta Subunit,Qual,Serum: NEGATIVE m[IU]/mL (ref <6)

## 2017-06-06 NOTE — Assessment & Plan Note (Signed)
Her mood is depressed today with mild to moderate interference and daily activity and no suicidal ideation.  This is a worsened due to current life stressors.  She is not taking the Cymbalta due to excessive drowsiness on a 60 mg dose.  She had previously been on a regimen of Cymbalta and Seroquel in the past. Plan: Restart Cymbalta at 30 mg daily Follow-up within 4 weeks

## 2017-06-06 NOTE — Assessment & Plan Note (Signed)
Her rate was abnormal but a regular rate on exam today.  She denies any significant palpitations chest pain or shortness of breath.  She does not recall ever having known cardiac arrhythmias. An EKG was obtained in clinic that showed a trigeminy pattern with every third beat appearing to be a premature beat without variation or dropped P waves across the strip.  There is no evidence of atrial fibrillation or heart block.

## 2017-06-06 NOTE — Assessment & Plan Note (Signed)
She was documented to have previously severely reduced EF in 2010 but subsequently back to normal in 2015.  The abnormal study was during a severe exacerbation with endotracheal intubation required for status asthmaticus.  She currently has no significant dyspnea on exertion, weight gain, or lower extremity swelling.

## 2017-06-06 NOTE — Assessment & Plan Note (Signed)
He is a history of previous menorrhagia that was controlled in the past with Depo-Provera shots.  She is currently on no chemical contraceptive but has been having increasing volume and irregularity of vaginal bleeding for the past 3 months.  She has not had any gynecology follow-up for years.  She has some mild to moderate lower abdominal pain associated with this.  She is up-to-date for Pap smear with last in 07/2013 with negative HPV co-testing. Plan: We will check CBC and ferritin today Will order for transvaginal ultrasound Plan refer to gynecology for follow-up if symptoms not improving once we get ultrasound results

## 2017-06-07 ENCOUNTER — Ambulatory Visit: Payer: Medicaid Other | Admitting: Internal Medicine

## 2017-06-07 ENCOUNTER — Other Ambulatory Visit: Payer: Self-pay | Admitting: Internal Medicine

## 2017-06-07 NOTE — Progress Notes (Signed)
Internal Medicine Clinic Attending  Case discussed with Dr. Rice at the time of the visit.  We reviewed the resident's history and exam and pertinent patient test results.  I agree with the assessment, diagnosis, and plan of care documented in the resident's note.  

## 2017-07-08 ENCOUNTER — Ambulatory Visit: Payer: Medicaid Other | Admitting: Internal Medicine

## 2017-08-25 IMAGING — CR DG CHEST 1V PORT
1 series · 1 of 1 positions shown · non-contrast
Comparison: One-view chest x-ray 01/13/2013.

CLINICAL DATA: Cough.  Wheezing.  Shortness of breath.

EXAM:
PORTABLE CHEST - 1 VIEW

[AP]
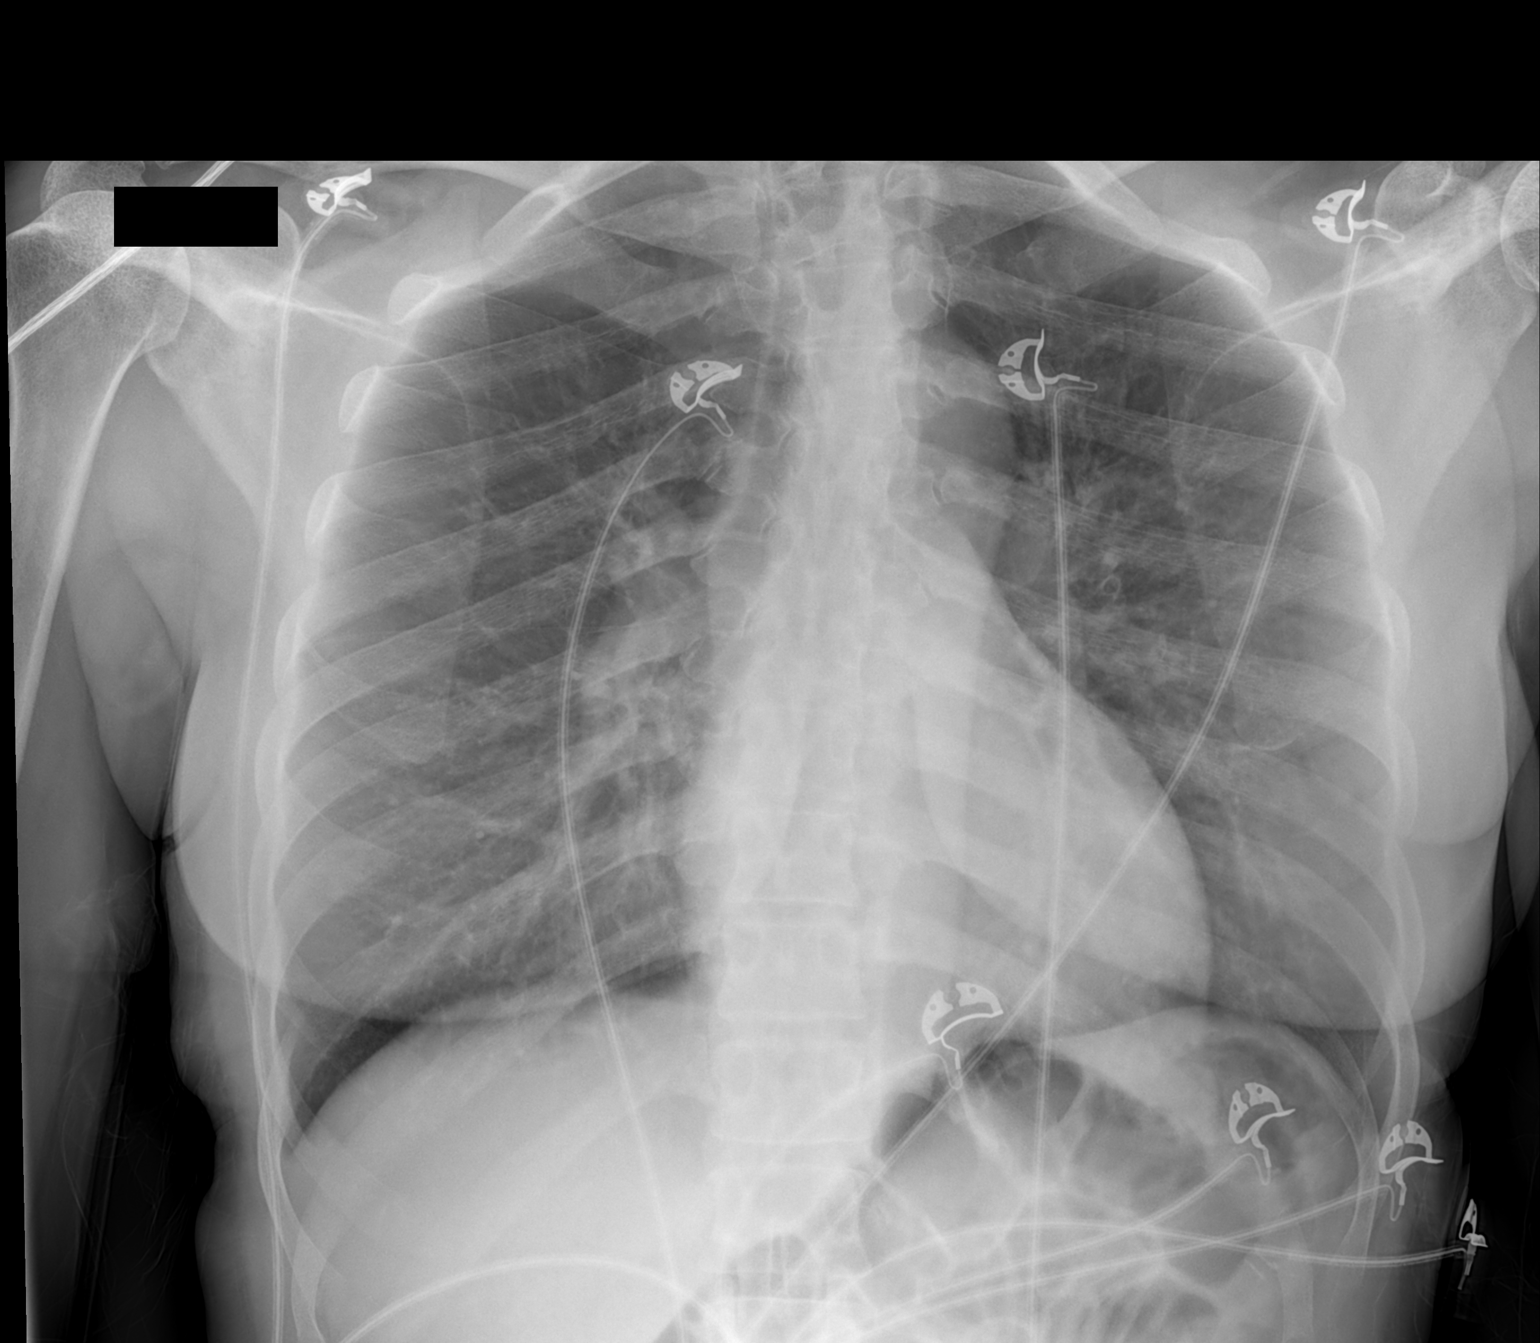

[1 of 1 positions shown; findings below may reference images not displayed]

FINDINGS: The heart size is normal. Mild interstitial prominence is new. Mild
perihilar prominence is evident bilaterally. No focal airspace
consolidation is evident. There is no edema or effusion. The
visualized soft tissues and bony thorax are unremarkable.
IMPRESSION: 1. Mild perihilar and interstitial prominence compatible with
reactive airways disease.
2. No focal airspace disease.

## 2017-11-30 ENCOUNTER — Emergency Department (HOSPITAL_COMMUNITY)
Admission: EM | Admit: 2017-11-30 | Discharge: 2017-11-30 | Disposition: A | Discharge status: Home / Self Care | Payer: Medicaid Other | Attending: Emergency Medicine | Admitting: Emergency Medicine

## 2017-11-30 ENCOUNTER — Other Ambulatory Visit: Payer: Self-pay

## 2017-11-30 ENCOUNTER — Encounter (HOSPITAL_COMMUNITY): Payer: Self-pay | Admitting: Obstetrics and Gynecology

## 2017-11-30 DIAGNOSIS — R0981 Nasal congestion: Secondary | ICD-10-CM | POA: Insufficient documentation

## 2017-11-30 DIAGNOSIS — Z5321 Procedure and treatment not carried out due to patient leaving prior to being seen by health care provider: Secondary | ICD-10-CM | POA: Insufficient documentation

## 2017-11-30 DIAGNOSIS — R509 Fever, unspecified: Secondary | ICD-10-CM | POA: Diagnosis not present

## 2017-11-30 DIAGNOSIS — J45909 Unspecified asthma, uncomplicated: Secondary | ICD-10-CM | POA: Diagnosis not present

## 2017-11-30 NOTE — ED Notes (Signed)
No answer second call 

## 2017-11-30 NOTE — ED Notes (Signed)
No call when answered for room

## 2017-11-30 NOTE — ED Triage Notes (Signed)
Pt reports she has asthma and has been around her grandson who is sick. Pt reports she has ben blowing out yellow mucus. Pt reports sore throat, but denies earache. Pt denies emesis.

## 2017-11-30 NOTE — ED Notes (Signed)
No answer x1

## 2017-11-30 NOTE — ED Notes (Signed)
No answer 3rd call

## 2019-10-09 ENCOUNTER — Encounter (HOSPITAL_COMMUNITY): Payer: Self-pay

## 2019-10-09 ENCOUNTER — Other Ambulatory Visit: Payer: Self-pay

## 2019-10-09 ENCOUNTER — Emergency Department (HOSPITAL_COMMUNITY): Payer: Medicaid Other

## 2019-10-09 ENCOUNTER — Emergency Department (HOSPITAL_COMMUNITY)
Admission: EM | Admit: 2019-10-09 | Discharge: 2019-10-09 | Disposition: A | Discharge status: Home / Self Care | Payer: Medicaid Other | Attending: Emergency Medicine | Admitting: Emergency Medicine

## 2019-10-09 DIAGNOSIS — Z91010 Allergy to peanuts: Secondary | ICD-10-CM | POA: Diagnosis not present

## 2019-10-09 DIAGNOSIS — Z20822 Contact with and (suspected) exposure to covid-19: Secondary | ICD-10-CM | POA: Diagnosis not present

## 2019-10-09 DIAGNOSIS — Z7951 Long term (current) use of inhaled steroids: Secondary | ICD-10-CM | POA: Diagnosis not present

## 2019-10-09 DIAGNOSIS — I509 Heart failure, unspecified: Secondary | ICD-10-CM | POA: Diagnosis not present

## 2019-10-09 DIAGNOSIS — J45909 Unspecified asthma, uncomplicated: Secondary | ICD-10-CM | POA: Diagnosis present

## 2019-10-09 DIAGNOSIS — F17210 Nicotine dependence, cigarettes, uncomplicated: Secondary | ICD-10-CM | POA: Insufficient documentation

## 2019-10-09 DIAGNOSIS — I110 Hypertensive heart disease with heart failure: Secondary | ICD-10-CM | POA: Diagnosis not present

## 2019-10-09 DIAGNOSIS — Z79899 Other long term (current) drug therapy: Secondary | ICD-10-CM | POA: Insufficient documentation

## 2019-10-09 DIAGNOSIS — J4521 Mild intermittent asthma with (acute) exacerbation: Secondary | ICD-10-CM | POA: Diagnosis not present

## 2019-10-09 LAB — RESPIRATORY PANEL BY RT PCR (FLU A&B, COVID)
Influenza A by PCR: NEGATIVE
Influenza B by PCR: NEGATIVE
SARS Coronavirus 2 by RT PCR: NEGATIVE

## 2019-10-09 MED ORDER — PREDNISONE 10 MG (21) PO TBPK: ORAL_TABLET | ORAL | 0 refills | Status: DC

## 2019-10-09 MED ORDER — MONTELUKAST SODIUM 10 MG PO TABS: 10.0000 mg | ORAL_TABLET | Freq: Every day | ORAL | 0 refills | Status: DC

## 2019-10-09 MED ORDER — ALBUTEROL SULFATE HFA 108 (90 BASE) MCG/ACT IN AERS: 2.0000 | INHALATION_SPRAY | RESPIRATORY_TRACT | 0 refills | Status: DC | PRN

## 2019-10-09 MED ORDER — ALBUTEROL SULFATE (2.5 MG/3ML) 0.083% IN NEBU: 2.5000 mg | INHALATION_SOLUTION | Freq: Four times a day (QID) | RESPIRATORY_TRACT | 12 refills | Status: DC | PRN

## 2019-10-09 MED ORDER — ALBUTEROL SULFATE HFA 108 (90 BASE) MCG/ACT IN AERS
2.0000 | INHALATION_SPRAY | Freq: Once | RESPIRATORY_TRACT | Status: AC
  Administered 2019-10-09: 13:00:00 2 via RESPIRATORY_TRACT
  Filled 2019-10-09: qty 6.7

## 2019-10-09 MED ORDER — AEROCHAMBER Z-STAT PLUS/MEDIUM MISC
1.0000 | Freq: Once | Status: AC
  Administered 2019-10-09: 13:00:00 1
  Filled 2019-10-09: qty 1

## 2019-10-09 MED ORDER — PREDNISONE 20 MG PO TABS
60.0000 mg | ORAL_TABLET | Freq: Once | ORAL | Status: AC
  Administered 2019-10-09: 13:00:00 60 mg via ORAL
  Filled 2019-10-09: qty 3

## 2019-10-09 NOTE — Discharge Instructions (Addendum)
  Prednisone: Take the prednisone, as prescribed, until finished. If you are a diabetic, please know prednisone can raise your blood sugar temporarily.  Albuterol: Use the albuterol, as needed for wheezing or shortness of breath.  Follow-up: Follow-up with the pulmonologist, as planned.

## 2019-10-09 NOTE — ED Triage Notes (Signed)
Patient c/o asthma. Patient states she ran out of her inhalers and neb solution. Patient has a productive cough with clear sputum.

## 2019-10-09 NOTE — ED Provider Notes (Signed)
[facility]-EMERGENCY DEPT Provider Note   CSN: [id number] Arrival date & time: 10/09/19  9622     History Chief Complaint  Patient presents with  . Asthma    Rebecca Holmes is a 44 y.o. female.  HPI      Rebecca Holmes is a 44 y.o. female, with a history of asthma, HTN, presenting to the ED with wheezing over the past week. Intermittent shortness of breath.  Nonproductive cough. She ran out of her albuterol inhaler as well as her nebulizer solution. She is working on setting up an appointment with her pulmonologist, Dr. Sherene Sires.  Denies fever/chills, N/V/D, chest pain, abdominal pain, dizziness, syncope, palpitations, lower extremity edema/pain, or any other complaints.  Past Medical History:  Diagnosis Date  . Anemia, iron deficiency   . Asthma    vs Pseudoasthma from ACE; Trial off ACE > PFT's normal 09/09/09 x DLCO 59 > corrects to 73%  . Depression   . Heart murmur   . HTN (hypertension)    Try off ACE 06/13/2009 due to pseudoasthma  . Insomnia   . Left ventricular dysfunction    Decreased; Low E 20-25% in the setting of severe illness requiring ICU care 06/2008; Cardiolyte 06/13/2009 normal perfusion w/o significant ischemia or scar; LVEF 38%  . Respiratory failure (HCC)    Ventilator Dependent; 2/2 status asthmatics 6/11-18/2010 admit [facility]  . Vision disorder     Patient Active Problem List   Diagnosis Date Noted  . Irregular heart rate 05/30/2017  . Menorrhagia with irregular cycle 05/30/2017  . Depression 04/21/2013  . Cigarette smoker 12/18/2011  . Preventative health care 07/18/2010  . Family planning, Depo-Provera contraception monitoring/administration 07/18/2010  . Neck pain, acute 07/18/2010  . Backache 10/07/2009  . Esophageal reflux 08/04/2009  . ANEMIA, IRON DEFICIENCY 06/24/2008  . Essential hypertension 06/24/2008  . Congestive heart failure (HCC) 06/24/2008  . Mild persistent chronic asthma without complication 06/24/2008  .  Acute on chronic respiratory failure with hypoxia (HCC) 06/24/2008    Past Surgical History:  Procedure Laterality Date  . DILATION AND CURETTAGE OF UTERUS  1997     OB History    Gravida      Para      Term      Preterm      AB      Living  5     SAB      TAB      Ectopic      Multiple      Live Births              Family History  Problem Relation Age of Onset  . Diabetes Mother 58  . Cancer Mother        unknown   . Diabetes type II Brother 20  . Asthma Maternal Grandfather   . Asthma Daughter     Social History   Tobacco Use  . Smoking status: Current Every Day Smoker    Packs/day: 0.50    Years: 25.00    Pack years: 12.50    Types: Cigarettes  . Smokeless tobacco: Never Used  Vaping Use  . Vaping Use: Never used  Substance Use Topics  . Alcohol use: Yes    Alcohol/week: 0.0 standard drinks    Comment: Once a month  . Drug use: Yes    Types: Marijuana    Home Medications Prior to Admission medications   Medication Sig Start Date End Date Taking? Authorizing Provider  albuterol (PROVENTIL) (2.5 MG/3ML) 0.083% nebulizer solution Take 3 mLs (2.5 mg total) by nebulization every 6 (six) hours as needed for wheezing or shortness of breath. 10/13/13   Emokpae, Ejiroghene E, MD  albuterol (PROVENTIL) (2.5 MG/3ML) 0.083% nebulizer solution Take 3 mLs (2.5 mg total) by nebulization every 6 (six) hours as needed for wheezing or shortness of breath. 10/09/19   Tahtiana Rozier C, PA-C  albuterol (VENTOLIN HFA) 108 (90 Base) MCG/ACT inhaler Inhale 2 puffs into the lungs every 6 (six) hours as needed for wheezing or shortness of breath.    [provider]  albuterol (VENTOLIN HFA) 108 (90 Base) MCG/ACT inhaler Inhale 2 puffs into the lungs every 4 (four) hours as needed for wheezing or shortness of breath. 10/09/19   Cathlyn Tersigni C, PA-C  budesonide-formoterol (SYMBICORT) 160-4.5 MCG/ACT inhaler INHALE 2 PUFFS FIRST THING IN THE MORNING AND THEN 12 HOURS  LATER 03/21/17   Marinda Elk, MD  diclofenac sodium (VOLTAREN) 1 % GEL Apply 2 g topically 4 (four) times daily. 03/22/15   Emokpae, Ejiroghene E, MD  diphenhydrAMINE (BENADRYL) 25 MG tablet Take 1 tablet (25 mg total) by mouth every 6 (six) hours. 02/21/15   Marlon Pel, PA-C  DULoxetine (CYMBALTA) 30 MG capsule Take 1 capsule (30 mg total) by mouth daily. 05/30/17   Rice, Jamesetta Orleans, MD  EPIPEN 2-PAK 0.3 MG/0.3ML SOAJ injection Inject 1 Device into the muscle daily as needed. For nut allergy 12/29/14   [provider]  famotidine (PEPCID) 20 MG tablet Take 1 tablet (20 mg total) by mouth daily. 03/21/17   Marinda Elk, MD  loratadine (CLARITIN) 10 MG tablet Take 1 tablet (10 mg total) by mouth daily. 02/21/15   Marlon Pel, PA-C  losartan (COZAAR) 25 MG tablet Take 1 tablet (25 mg total) by mouth daily. 05/30/17   Rice, Jamesetta Orleans, MD  montelukast (SINGULAIR) 10 MG tablet Take 1 tablet (10 mg total) by mouth at bedtime. 10/09/19   Cyenna Rebello C, PA-C  predniSONE (STERAPRED UNI-PAK 21 TAB) 10 MG (21) TBPK tablet Take 6 tabs (60mg ) day 1, 5 tabs (50mg ) day 2, 4 tabs (40mg ) day 3, 3 tabs (30mg ) day 4, 2 tabs (20mg ) day 5, and 1 tab (10mg ) day 6. 10/09/19   Ajahni Nay C, PA-C    Allergies    Peanut-containing drug products  Review of Systems   Review of Systems  Constitutional: Negative for chills, diaphoresis and fever.  Respiratory: Positive for cough and shortness of breath.   Cardiovascular: Negative for chest pain and leg swelling.  Gastrointestinal: Negative for abdominal pain, diarrhea, nausea and vomiting.  Musculoskeletal: Negative for back pain.  Neurological: Negative for dizziness, syncope and weakness.  All other systems reviewed and are negative.   Physical Exam Updated Vital Signs BP (!) 149/97 (BP Location: Right Arm)   Pulse 96   Temp 98.2 F (36.8 C) (Oral)   Resp 20   Ht 5\' 7"  (1.702 m)   Wt 73.5 kg   LMP 09/09/2019 (Approximate)    SpO2 97%   BMI 25.37 kg/m   Physical Exam Vitals and nursing note reviewed.  Constitutional:      General: She is not in acute distress.    Appearance: She is well-developed. She is not diaphoretic.  HENT:     Head: Normocephalic and atraumatic.     Mouth/Throat:     Mouth: Mucous membranes are moist.     Pharynx: Oropharynx is clear.  Eyes:  Conjunctiva/sclera: Conjunctivae normal.  Cardiovascular:     Rate and Rhythm: Normal rate and regular rhythm.     Pulses: Normal pulses.          Radial pulses are 2+ on the right side and 2+ on the left side.       Posterior tibial pulses are 2+ on the right side and 2+ on the left side.     Heart sounds: Normal heart sounds.     Comments: Tactile temperature in the extremities appropriate and equal bilaterally. Pulmonary:     Effort: Pulmonary effort is normal. No respiratory distress.     Breath sounds: Wheezing present.     Comments: Mild, global expiratory wheezes.  No increased work of breathing.  Speaks in full sentences without difficulty. Abdominal:     Palpations: Abdomen is soft.     Tenderness: There is no abdominal tenderness. There is no guarding.  Musculoskeletal:     Cervical back: Neck supple.     Right lower leg: No edema.     Left lower leg: No edema.  Lymphadenopathy:     Cervical: No cervical adenopathy.  Skin:    General: Skin is warm and dry.  Neurological:     Mental Status: She is alert.  Psychiatric:        Mood and Affect: Mood and affect normal.        Speech: Speech normal.        Behavior: Behavior normal.     ED Results / Procedures / Treatments   Labs (all labs ordered are listed, but only abnormal results are displayed) Labs Reviewed  RESPIRATORY PANEL BY RT PCR (FLU A&B, COVID)    EKG None  Radiology DG Chest Portable 1 View  Result Date: 10/09/2019 CLINICAL DATA:  Cough EXAM: PORTABLE CHEST 1 VIEW COMPARISON:  03/18/2017 FINDINGS: The heart size and mediastinal contours are within  normal limits. Both lungs are clear. The visualized skeletal structures are unremarkable. IMPRESSION: No active disease. Electronically Signed   By: Elige Ko   On: 10/09/2019 12:37    Procedures Procedures (including critical care time)  Medications Ordered in ED Medications  albuterol (VENTOLIN HFA) 108 (90 Base) MCG/ACT inhaler 2 puff (2 puffs Inhalation Given 10/09/19 1230)  aerochamber Z-Stat Plus/medium 1 each (1 each Other Given 10/09/19 1230)  predniSONE (DELTASONE) tablet 60 mg (60 mg Oral Given 10/09/19 1230)    ED Course  I have reviewed the triage vital signs and the nursing notes.  Pertinent labs & imaging results that were available during my care of the patient were reviewed by me and considered in my medical decision making (see chart for details).    MDM Rules/Calculators/A&P                          Patient presents with wheezing over the past week. Patient is nontoxic appearing, afebrile, not tachycardic, not tachypneic, not hypotensive, maintains excellent SPO2 on room air, and is in no apparent distress.   I have reviewed the patient's chart to obtain more information.   I reviewed and interpreted the patient's labs and radiological studies. Covid negative.  No acute abnormality on chest x-ray.  The patient was given instructions for home care as well as return precautions. Patient voices understanding of these instructions, accepts the plan, and is comfortable with discharge.     Final Clinical Impression(s) / ED Diagnoses Final diagnoses:  Mild intermittent asthma with exacerbation  Rx / DC Orders ED Discharge Orders         Ordered    montelukast (SINGULAIR) 10 MG tablet  Daily at bedtime        10/09/19 1343    albuterol (VENTOLIN HFA) 108 (90 Base) MCG/ACT inhaler  Every 4 hours PRN        10/09/19 1343    predniSONE (STERAPRED UNI-PAK 21 TAB) 10 MG (21) TBPK tablet        10/09/19 1343    albuterol (PROVENTIL) (2.5 MG/3ML) 0.083% nebulizer  solution  Every 6 hours PRN        10/09/19 1343           Anselm PancoastJoy, Bradan Congrove C, PA-C 10/09/19 1427    Tilden Fossaees, Elizabeth, MD 10/09/19 [phone]

## 2020-03-25 ENCOUNTER — Ambulatory Visit (INDEPENDENT_AMBULATORY_CARE_PROVIDER_SITE_OTHER): Payer: 59 | Admitting: Internal Medicine

## 2020-03-25 ENCOUNTER — Encounter: Payer: Self-pay | Admitting: Internal Medicine

## 2020-03-25 ENCOUNTER — Other Ambulatory Visit: Payer: Self-pay

## 2020-03-25 DIAGNOSIS — F17210 Nicotine dependence, cigarettes, uncomplicated: Secondary | ICD-10-CM

## 2020-03-25 DIAGNOSIS — J4530 Mild persistent asthma, uncomplicated: Secondary | ICD-10-CM

## 2020-03-25 MED ORDER — ALBUTEROL SULFATE HFA 108 (90 BASE) MCG/ACT IN AERS: 2.0000 | INHALATION_SPRAY | RESPIRATORY_TRACT | 0 refills | Status: AC | PRN

## 2020-03-25 MED ORDER — MONTELUKAST SODIUM 10 MG PO TABS: 10.0000 mg | ORAL_TABLET | Freq: Every day | ORAL | 11 refills | Status: AC

## 2020-03-25 MED ORDER — BUDESONIDE-FORMOTEROL FUMARATE 160-4.5 MCG/ACT IN AERO
INHALATION_SPRAY | RESPIRATORY_TRACT | 11 refills | Status: DC
Start: 2020-03-25 — End: 2021-03-31

## 2020-03-25 MED ORDER — ALBUTEROL SULFATE (2.5 MG/3ML) 0.083% IN NEBU: 2.5000 mg | INHALATION_SOLUTION | RESPIRATORY_TRACT | 0 refills | Status: AC | PRN

## 2020-03-25 NOTE — Patient Instructions (Addendum)
rec Go ahead and take the 3rd pfizer 6 months after last shot  Plan A = Automatic = Always=    symbicort 160 Take 2 puffs first thing in am and then another 2 puffs about 12 hours later.     Work on inhaler technique:  relax and gently blow all the way out then take a nice smooth deep breath back in, triggering the inhaler at same time you start breathing in.  Hold for up to 5 seconds if you can. Blow out thru nose. Rinse and gargle with water when done      Plan B = Backup (to supplement plan A, not to replace it) Only use your albuterol inhaler as a rescue medication to be used if you can't catch your breath by resting or doing a relaxed purse lip breathing pattern.  - The less you use it, the better it will work when you need it. - Ok to use the inhaler up to 2 puffs  every 4 hours if you must but call for appointment if use goes up over your usual need - Don't leave home without it !!  (think of it like the spare tire for your car)   Plan C = Crisis (instead of Plan B but only if Plan B stops working) - only use your albuterol nebulizer if you first try Plan B and it fails to help > ok to use the nebulizer up to every 4 hours but if start needing it regularly call for immediate appointment   The key is to stop smoking completely before smoking completely stops you!     Please schedule a follow up visit in 3 months but call sooner if needed with pfts - also bring inhalers

## 2020-03-25 NOTE — Progress Notes (Signed)
Subjective:    Patient ID: Rebecca Holmes, female    DOB: 03/03/1975    MRN: [id number]    Brief patient profile:  [age] yobf active smoker with childhood asthma never completely resolved and required ET 2010 last seen in pulmonary clinic 2011 with nl pfts and despite continue smoking since then had completely nl pfts p saba again 05/11/2015       05/11/2015  f/u ov/Rebecca Holmes re: mild chronic asthma/ maint rx symb 160 2bid  Chief Complaint  Patient presents with  . Follow-up    PFT done today. Pt states that her breathing is overall doing well. She is using proair 2 x per wk on average and has not needed neb.   rarely needs saba as long as has symb 160 despite active smoking rec Plan A = Automatic = Symbicort 160 Take 2 puffs first thing in am and then another 2 puffs about 12 hours later.  The key is to stop smoking completely before smoking completely stops you!  Work on inhaler technique:  relax and gently blow all the way out then take a nice smooth deep breath back in, triggering the inhaler at same time you start breathing in.  Hold for up to 5 seconds if you can. Blow out thru nose. Rinse and gargle with water when done Plan B = Backup Only use your albuterol (Proair) as a rescue medication Plan C = Crisis - only use your albuterol nebulizer if you first try Plan B and it fails to help > ok to use the nebulizer up to every 4 hours but if start needing it regularly call for immediate appointment    Admit date: 03/18/2017 Discharge date: 03/21/2017    Brief/Interim Summary: 45 y.o.femalepast medical history asthma with a history of intubation in 2010, heart failure with an EF of 55-60% in 2015, comes into the hospital for shortness of breath, fever and nonproductive cough  Discharge Diagnoses:  Active Problems:   Essential hypertension   Acute on chronic respiratory failure with hypoxia (HCC)   Depression, controlled   Asthma attack  Acute respiratory failure with hypoxia due  to asthma attack: On admission she was placed on the stepdown on BiPAP she was started on IV steroids and inhalers. Chest x-ray shows no infiltrates she remained afebrile with no leukocytosis she would continue steroid tapers and inhalers at home.  Essential hypertension: No changes were made to her medication.  Depression, controlled: No changes were made to her medication.   Discharge Instructions      Discharge Instructions    Diet - low sodium heart healthy   Complete by:  As directed    Increase activity slowly   Complete by:  As directed          Allergies as of 03/21/2017      Reactions   Peanut-containing Drug Products Swelling   REACTION: swell up              Medication List     STOP taking these medications   azithromycin 250 MG tablet Commonly known as:  ZITHROMAX   clindamycin 75 MG/5ML solution Commonly known as:  CLEOCIN     TAKE these medications   albuterol (2.5 MG/3ML) 0.083% nebulizer solution Commonly known as:  PROVENTIL Take 3 mLs (2.5 mg total) by nebulization every 6 (six) hours as needed for wheezing or shortness of breath.   albuterol 108 (90 Base) MCG/ACT inhaler Commonly known as:  PROAIR HFA Inhale 2 puffs  into the lungs every 6 (six) hours as needed for wheezing or shortness of breath.   budesonide-formoterol 160-4.5 MCG/ACT inhaler Commonly known as:  SYMBICORT INHALE 2 PUFFS FIRST THING IN THE MORNING AND THEN 12 HOURS LATER   diclofenac sodium 1 % Gel Commonly known as:  VOLTAREN Apply 2 g topically 4 (four) times daily.   diphenhydrAMINE 25 MG tablet Commonly known as:  BENADRYL Take 1 tablet (25 mg total) by mouth every 6 (six) hours.   DULoxetine 60 MG capsule Commonly known as:  CYMBALTA Take 1 capsule (60 mg total) by mouth every morning.   EPIPEN 2-PAK 0.3 mg/0.3 mL Soaj injection Generic drug:  EPINEPHrine Inject 1 Device into the muscle daily as needed. For nut allergy   famotidine  20 MG tablet Commonly known as:  PEPCID Take 1 tablet (20 mg total) by mouth daily.   HYDROcodone-acetaminophen 7.5-325 mg/15 ml solution Commonly known as:  HYCET Take 5 mLs by mouth every 6 (six) hours as needed for moderate pain or severe pain.   loratadine 10 MG tablet Commonly known as:  CLARITIN Take 1 tablet (10 mg total) by mouth daily.   losartan 25 MG tablet Commonly known as:  COZAAR Take 1 tablet (25 mg total) by mouth daily.   predniSONE 10 MG tablet Commonly known as:  DELTASONE Takes 5 tablets for 1 days, then 4 tablets for 1 days, then 3 tablets for 1 days, then 2 tabs for 1 days, then 1 tab for 1 days, and then stop. What changed:    medication strength  how much to take  how to take this  when to take this  additional instructions         05/06/2017  f/u ov/Rebecca Holmes re:  Chronic asthma/ 1 cig q 4 days Chief Complaint  Patient presents with  . Hospitalization Follow-up    admitted to hospital with asthma flare 03/18/17 to 03/21/17. She states her breathing seems okay today. She does get worse with warmer weather. She is not using her albuterol inhaler, but is using neb every day at least 2 x daily and occ wakes up in the night and has to use.   doe =  MMRC3 = can't walk 100 yards even at a slow pace at a flat grade s stopping due to sob / legs hurting  Cough better now Sleeping trouble lying flat due to sob p several min  Misunderstood how to use maint vs prns and still overusing saba  Admits breathing was going downhill x 3 weeks prior to req admit and did not contact this office as instructed when saba use increased. rec Plan A = Automatic = Symbicort 160 Take 2 puffs first thing in am and then another 2 puffs about 12 hours later.  Work on inhaler technique:   Plan B = Backup Only use your albuterol as a rescue medication to be used if you can't catch your breath Plan C = Crisis - only use your albuterol nebulizer if you first try Plan B and it  fails to help > ok to use the nebulizer up to every 4 hours but if start needing it regularly call for immediate appointment Please schedule a follow up office visit in 4 weeks, sooner if needed  with all medications /inhalers/ solutions in hand so we can verify exactly what you are taking. This includes all medications from all doctors and over the counters  -with PFTs on return      03/25/2020  f/u ov/Rebecca Holmes re: asthma/ smoking  Chief Complaint  Patient presents with  . Follow-up    Breathing is "okay"- she is needing refills on her symbicort, albuterol inhaler/neb and montelukast. She has been out of inhaled meds for 3 days. Using saline neb sol about 2 x per wk.   Dyspnea:  Much better while on symbicort and when on it only uses  hfa twice a week  Cough: none  Sleeping: on side flat bed no pillows  SABA use: as above  02: none  Covid status:   Vac x 2  Hb symptoms better prevacid    No obvious day to day or daytime variability or assoc excess/ purulent sputum or mucus plugs or hemoptysis or cp or chest tightness, subjective wheeze or overt sinus or hb symptoms.   Sleeping  without nocturnal  or early am exacerbation  of respiratory  c/o's or need for noct saba. Also denies any obvious fluctuation of symptoms with weather or environmental changes or other aggravating or alleviating factors except as outlined above   No unusual exposure hx or h/o childhood pna/ asthma or knowledge of premature birth.  Current Allergies, Complete Past Medical History, Past Surgical History, Family History, and Social History were reviewed in [employer] record.  ROS  The following are not active complaints unless bolded Hoarseness, sore throat, dysphagia, dental problems, itching, sneezing,  nasal congestion or discharge of excess mucus or purulent secretions, ear ache,   fever, chills, sweats, unintended wt loss or wt gain, classically pleuritic or exertional cp,  orthopnea pnd or  arm/hand swelling  or leg swelling, presyncope, palpitations, abdominal pain, anorexia, nausea, vomiting, diarrhea  or change in bowel habits or change in bladder habits, change in stools or change in urine, dysuria, hematuria,  rash, arthralgias, visual complaints, headache, numbness, weakness or ataxia or problems with walking or coordination,  change in mood or  memory.        Current Meds  Medication Sig  . albuterol (VENTOLIN HFA) 108 (90 Base) MCG/ACT inhaler Inhale 2 puffs into the lungs every 4 (four) hours as needed for wheezing or shortness of breath.  . diclofenac sodium (VOLTAREN) 1 % GEL Apply 2 g topically 4 (four) times daily.  . diphenhydrAMINE (BENADRYL) 25 MG tablet Take 1 tablet (25 mg total) by mouth every 6 (six) hours.  . DULoxetine (CYMBALTA) 30 MG capsule Take 1 capsule (30 mg total) by mouth daily.  Marland Kitchen EPIPEN 2-PAK 0.3 MG/0.3ML SOAJ injection Inject 1 Device into the muscle daily as needed. For nut allergy  . famotidine (PEPCID) 20 MG tablet Take 1 tablet (20 mg total) by mouth daily.  Marland Kitchen loratadine (CLARITIN) 10 MG tablet Take 1 tablet (10 mg total) by mouth daily.  Marland Kitchen losartan (COZAAR) 25 MG tablet Take 1 tablet (25 mg total) by mouth daily.                       Objective:   Physical Exam  amb bf nad   03/25/2020        142 05/06/2017        153  03/08/2015        161     12/31/14 155 lb (70.308 kg)  06/08/14 164 lb 4.8 oz (74.526 kg)  10/13/13 172 lb 12.8 oz (78.382 kg)    Vital signs reviewed  03/25/2020  - Note at rest 02 sats  100% on RA   General appearance:  amb bf nad    HEENT : pt wearing mask not removed for exam due to covid -19 concerns.    NECK :  without JVD/Nodes/TM/ nl carotid upstrokes bilaterally   LUNGS: no acc muscle use,  Nl contour chest which is clear to A and P bilaterally without cough on insp or exp maneuvers   CV:  RRR  no s3 or murmur or increase in P2, and no edema   ABD:  soft and nontender with nl inspiratory  excursion in the supine position. No bruits or organomegaly appreciated, bowel sounds nl  MS:  Nl gait/ ext warm without deformities, calf tenderness, cyanosis or clubbing No obvious joint restrictions   SKIN: warm and dry without lesions    NEURO:  alert, approp, nl sensorium with  no motor or cerebellar deficits apparent.         Assessment & Plan:

## 2020-03-26 ENCOUNTER — Encounter: Payer: Self-pay | Admitting: Internal Medicine

## 2020-03-26 NOTE — Assessment & Plan Note (Signed)
Counseled re importance of smoking cessation but did not meet time criteria for separate billing            Each maintenance medication was reviewed in detail including emphasizing most importantly the difference between maintenance and prns and under what circumstances the prns are to be triggered using an action plan format where appropriate.  Total time for H and P, chart review, counseling, reviewing hfa  device(s) and generating customized AVS unique to this office visit / same day charting = 20 min       

## 2020-03-26 NOTE — Assessment & Plan Note (Addendum)
Required ET 2010  -  PFT's  02/11/2015   FEV1 2.16 (76 % ) ratio 66  p 37 % improvement from saba with DLCO  52 % corrects to 69 % for alv volume   - 05/11/2015  extensive coaching HFA effectiveness =    90%   - PFT's  05/11/2015  FEV1 2.61 (92 % ) ratio 70  p 19 % improvement from saba p no rx  prior to study with DLCO  60 % corrects to 6581 % for alv volume   - 05/06/2017  After extensive coaching inhaler device  effectiveness =    75% from a baseline of 50% - 03/25/2020  The proper method of use, as well as anticipated side effects, of a metered-dose inhaler were discussed and demonstrated to the patient using teach back method.    Despite smoking, when maintains singulair and does have access to symbicort 160 and uses it >>> All goals of chronic asthma control met including optimal function and elimination of symptoms with minimal need for rescue therapy.  Contingencies discussed in full including contacting this office immediately if not controlling the symptoms using the rule of two's.

## 2020-06-24 ENCOUNTER — Other Ambulatory Visit (HOSPITAL_COMMUNITY): Payer: 59

## 2020-06-27 ENCOUNTER — Ambulatory Visit: Payer: 59 | Admitting: Acute Care

## 2020-06-27 ENCOUNTER — Ambulatory Visit: Payer: 59 | Admitting: Internal Medicine

## 2020-10-21 ENCOUNTER — Emergency Department (HOSPITAL_COMMUNITY)
Admission: EM | Admit: 2020-10-21 | Discharge: 2020-10-21 | Disposition: A | Discharge status: Home / Self Care | Payer: Medicaid Other | Attending: Emergency Medicine | Admitting: Emergency Medicine

## 2020-10-21 ENCOUNTER — Encounter (HOSPITAL_COMMUNITY): Payer: Self-pay

## 2020-10-21 ENCOUNTER — Other Ambulatory Visit: Payer: Self-pay

## 2020-10-21 DIAGNOSIS — J4520 Mild intermittent asthma, uncomplicated: Secondary | ICD-10-CM | POA: Insufficient documentation

## 2020-10-21 DIAGNOSIS — I509 Heart failure, unspecified: Secondary | ICD-10-CM | POA: Insufficient documentation

## 2020-10-21 DIAGNOSIS — D649 Anemia, unspecified: Secondary | ICD-10-CM | POA: Insufficient documentation

## 2020-10-21 DIAGNOSIS — I110 Hypertensive heart disease with heart failure: Secondary | ICD-10-CM | POA: Insufficient documentation

## 2020-10-21 DIAGNOSIS — Z91010 Allergy to peanuts: Secondary | ICD-10-CM | POA: Insufficient documentation

## 2020-10-21 DIAGNOSIS — F17210 Nicotine dependence, cigarettes, uncomplicated: Secondary | ICD-10-CM | POA: Diagnosis not present

## 2020-10-21 DIAGNOSIS — L02214 Cutaneous abscess of groin: Secondary | ICD-10-CM | POA: Insufficient documentation

## 2020-10-21 DIAGNOSIS — L0291 Cutaneous abscess, unspecified: Secondary | ICD-10-CM

## 2020-10-21 DIAGNOSIS — Z79899 Other long term (current) drug therapy: Secondary | ICD-10-CM | POA: Diagnosis not present

## 2020-10-21 LAB — CBC WITH DIFFERENTIAL/PLATELET
Abs Immature Granulocytes: 0.02 10*3/uL (ref 0.00–0.07)
Basophils Absolute: 0 10*3/uL (ref 0.0–0.1)
Basophils Relative: 0 %
Eosinophils Absolute: 0.1 10*3/uL (ref 0.0–0.5)
Eosinophils Relative: 2 %
HCT: 27.8 % — ABNORMAL LOW (ref 36.0–46.0)
Hemoglobin: 7.9 g/dL — ABNORMAL LOW (ref 12.0–15.0)
Immature Granulocytes: 0 %
Lymphocytes Relative: 22 %
Lymphs Abs: 1.8 10*3/uL (ref 0.7–4.0)
MCH: 21 pg — ABNORMAL LOW (ref 26.0–34.0)
MCHC: 28.4 g/dL — ABNORMAL LOW (ref 30.0–36.0)
MCV: 73.7 fL — ABNORMAL LOW (ref 80.0–100.0)
Monocytes Absolute: 0.5 10*3/uL (ref 0.1–1.0)
Monocytes Relative: 7 %
Neutro Abs: 5.5 10*3/uL (ref 1.7–7.7)
Neutrophils Relative %: 69 %
Platelets: 299 10*3/uL (ref 150–400)
RBC: 3.77 MIL/uL — ABNORMAL LOW (ref 3.87–5.11)
RDW: 20.2 % — ABNORMAL HIGH (ref 11.5–15.5)
WBC: 8 10*3/uL (ref 4.0–10.5)
nRBC: 0 % (ref 0.0–0.2)

## 2020-10-21 MED ORDER — DOXYCYCLINE HYCLATE 100 MG PO CAPS: 100.0000 mg | ORAL_CAPSULE | Freq: Two times a day (BID) | ORAL | 0 refills | Status: AC

## 2020-10-21 MED ORDER — DOXYCYCLINE HYCLATE 100 MG PO CAPS: 100.0000 mg | ORAL_CAPSULE | Freq: Two times a day (BID) | ORAL | 0 refills | Status: DC

## 2020-10-21 MED ORDER — LIDOCAINE-EPINEPHRINE (PF) 2 %-1:200000 IJ SOLN
10.0000 mL | Freq: Once | INTRAMUSCULAR | Status: AC
  Administered 2020-10-21: 10 mL
  Filled 2020-10-21: qty 20

## 2020-10-21 MED ORDER — FERROUS SULFATE 325 (65 FE) MG PO TABS: 325.0000 mg | ORAL_TABLET | Freq: Every day | ORAL | 0 refills | Status: AC

## 2020-10-21 NOTE — ED Provider Notes (Signed)
[facility]-EMERGENCY DEPT Provider Note   CSN: [id number] Arrival date & time: 10/21/20  1254     History Chief Complaint  Patient presents with  . Abscess    Rebecca Holmes is a 45 y.o. female.  Rebecca Holmes is a 45 y.o. female with history of asthma, anemia, hypertension, who presents to the emergency department for evaluation of abscess to the left side of her groin.  She reports its been present for about 4 days, increasing in size and becoming increasingly painful.  She has not noticed any drainage from this area.  She reports today she noticed a larger area of redness around it.  She has never had these before.  No fevers or chills.  No other aggravating or alleviating factors.  The history is provided by the patient.      Past Medical History:  Diagnosis Date  . Anemia, iron deficiency   . Asthma    vs Pseudoasthma from ACE; Trial off ACE > PFT's normal 09/09/09 x DLCO 59 > corrects to 73%  . Depression   . Heart murmur   . HTN (hypertension)    Try off ACE 06/13/2009 due to pseudoasthma  . Insomnia   . Left ventricular dysfunction    Decreased; Low E 20-25% in the setting of severe illness requiring ICU care 06/2008; Cardiolyte 06/13/2009 normal perfusion w/o significant ischemia or scar; LVEF 38%  . Respiratory failure (HCC)    Ventilator Dependent; 2/2 status asthmatics 6/11-18/2010 admit [facility]  . Vision disorder     Patient Active Problem List   Diagnosis Date Noted  . Irregular heart rate 05/30/2017  . Menorrhagia with irregular cycle 05/30/2017  . Depression 04/21/2013  . Cigarette smoker 12/18/2011  . Preventative health care 07/18/2010  . Family planning, Depo-Provera contraception monitoring/administration 07/18/2010  . Neck pain, acute 07/18/2010  . Backache 10/07/2009  . Esophageal reflux 08/04/2009  . ANEMIA, IRON DEFICIENCY 06/24/2008  . Essential hypertension 06/24/2008  . Congestive heart failure (HCC) 06/24/2008  . Mild  persistent chronic asthma without complication 06/24/2008  . Acute on chronic respiratory failure with hypoxia (HCC) 06/24/2008    Past Surgical History:  Procedure Laterality Date  . DILATION AND CURETTAGE OF UTERUS  1997     OB History     Gravida      Para      Term      Preterm      AB      Living  5      SAB      IAB      Ectopic      Multiple      Live Births              Family History  Problem Relation Age of Onset  . Diabetes Mother 33  . Cancer Mother        unknown   . Diabetes type II Brother 20  . Asthma Maternal Grandfather   . Asthma Daughter     Social History   Tobacco Use  . Smoking status: Every Day    Packs/day: 0.50    Years: 25.00    Pack years: 12.50    Types: Cigarettes  . Smokeless tobacco: Never  Vaping Use  . Vaping Use: Never used  Substance Use Topics  . Alcohol use: Yes    Alcohol/week: 0.0 standard drinks    Comment: Once a month  . Drug use: Yes    Types: Marijuana  Home Medications Prior to Admission medications   Medication Sig Start Date End Date Taking? Authorizing Provider  ferrous sulfate 325 (65 FE) MG tablet Take 1 tablet (325 mg total) by mouth daily. 10/21/20  Yes Dartha Lodge, PA-C  albuterol (PROVENTIL) (2.5 MG/3ML) 0.083% nebulizer solution Take 3 mLs (2.5 mg total) by nebulization every 4 (four) hours as needed for wheezing or shortness of breath. 03/25/20   Nyoka Cowden, MD  albuterol (VENTOLIN HFA) 108 (90 Base) MCG/ACT inhaler Inhale 2 puffs into the lungs every 4 (four) hours as needed for wheezing or shortness of breath. 03/25/20   Nyoka Cowden, MD  budesonide-formoterol (SYMBICORT) 160-4.5 MCG/ACT inhaler INHALE 2 PUFFS FIRST THING IN THE MORNING AND THEN 12 HOURS LATER 03/25/20   Nyoka Cowden, MD  diclofenac sodium (VOLTAREN) 1 % GEL Apply 2 g topically 4 (four) times daily. 03/22/15   Emokpae, Ejiroghene E, MD  diphenhydrAMINE (BENADRYL) 25 MG tablet Take 1 tablet (25 mg total)  by mouth every 6 (six) hours. 02/21/15   Marlon Pel, PA-C  doxycycline (VIBRAMYCIN) 100 MG capsule Take 1 capsule (100 mg total) by mouth 2 (two) times daily. One po bid x 7 days 10/21/20   Dartha Lodge, PA-C  DULoxetine (CYMBALTA) 30 MG capsule Take 1 capsule (30 mg total) by mouth daily. 05/30/17   Rice, Jamesetta Orleans, MD  EPIPEN 2-PAK 0.3 MG/0.3ML SOAJ injection Inject 1 Device into the muscle daily as needed. For nut allergy 12/29/14   [provider]  famotidine (PEPCID) 20 MG tablet Take 1 tablet (20 mg total) by mouth daily. 03/21/17   Marinda Elk, MD  loratadine (CLARITIN) 10 MG tablet Take 1 tablet (10 mg total) by mouth daily. 02/21/15   Marlon Pel, PA-C  losartan (COZAAR) 25 MG tablet Take 1 tablet (25 mg total) by mouth daily. 05/30/17   Rice, Jamesetta Orleans, MD  montelukast (SINGULAIR) 10 MG tablet Take 1 tablet (10 mg total) by mouth at bedtime. 03/25/20   Nyoka Cowden, MD    Allergies    Peanut-containing drug products  Review of Systems   Review of Systems  Constitutional:  Negative for chills, fatigue and fever.  Respiratory:  Negative for shortness of breath.   Cardiovascular:  Negative for chest pain.  Gastrointestinal:  Negative for blood in stool, nausea and vomiting.  Musculoskeletal:  Negative for arthralgias and myalgias.  Skin:  Positive for color change.       Abscess  All other systems reviewed and are negative.  Physical Exam Updated Vital Signs BP (!) 142/99 (BP Location: Left Arm)   Pulse (!) 105   Temp 98 F (36.7 C) (Oral)   Resp 16   Ht 5\' 7"  (1.702 m)   Wt 64.4 kg   LMP 10/03/2020   SpO2 92%   BMI 22.24 kg/m   Physical Exam Vitals and nursing note reviewed.  Constitutional:      General: She is not in acute distress.    Appearance: Normal appearance. She is well-developed. She is not ill-appearing or diaphoretic.     Comments: Well-appearing and in no distress   HENT:     Head: Normocephalic and atraumatic.   Eyes:     General:        Right eye: No discharge.        Left eye: No discharge.  Cardiovascular:     Rate and Rhythm: Normal rate.     Pulses: Normal pulses.  Pulmonary:  Effort: Pulmonary effort is normal. No respiratory distress.  Abdominal:     Palpations: Abdomen is soft.  Musculoskeletal:        General: No deformity.     Cervical back: Neck supple.  Skin:    General: Skin is warm and dry.     Capillary Refill: Capillary refill takes less than 2 seconds.     Comments: Left groin with 3 x 3 cm palpable area of fluctuance with surrounding erythema, no expressible drainage, no surrounding induration or crepitus. Does not extend to the labia  Neurological:     Mental Status: She is alert and oriented to person, place, and time.     Coordination: Coordination normal.     Comments: Speech is clear, able to follow commands Moves extremities without ataxia, coordination intact  Psychiatric:        Mood and Affect: Mood normal.        Behavior: Behavior normal.    ED Results / Procedures / Treatments   Labs (all labs ordered are listed, but only abnormal results are displayed) Labs Reviewed  CBC WITH DIFFERENTIAL/PLATELET - Abnormal; Notable for the following components:      Result Value   RBC 3.77 (*)    Hemoglobin 7.9 (*)    HCT 27.8 (*)    MCV 73.7 (*)    MCH 21.0 (*)    MCHC 28.4 (*)    RDW 20.2 (*)    All other components within normal limits    EKG None  Radiology No results found.  Procedures .Marland KitchenIncision and Drainage  Date/Time: 10/23/2020 6:49 AM Performed by: Dartha Lodge, PA-C Authorized by: Dartha Lodge, PA-C   Consent:    Consent obtained:  Verbal   Consent given by:  Patient   Risks, benefits, and alternatives were discussed: yes     Risks discussed:  Bleeding, incomplete drainage, pain and damage to other organs   Alternatives discussed:  No treatment Universal protocol:    Procedure explained and questions answered to patient or  proxy's satisfaction: yes     Patient identity confirmed:  Verbally with patient Location:    Type:  Abscess   Location: Left groin. Pre-procedure details:    Skin preparation:  Chlorhexidine with alcohol Sedation:    Sedation type:  None Anesthesia:    Anesthesia method:  Local infiltration   Local anesthetic:  Lidocaine 2% WITH epi Procedure type:    Complexity:  Simple Procedure details:    Incision types:  Single straight   Incision depth:  Dermal   Wound management:  Irrigated with saline and probed and deloculated   Drainage:  Purulent   Drainage amount:  Copious   Packing materials:  1/4 in iodoform gauze Post-procedure details:    Procedure completion:  Tolerated well, no immediate complications   Medications Ordered in ED Medications  lidocaine-EPINEPHrine (XYLOCAINE W/EPI) 2 %-1:200000 (PF) injection 10 mL (10 mLs Infiltration Given by Other 10/21/20 1434)    ED Course  I have reviewed the triage vital signs and the nursing notes.  Pertinent labs & imaging results that were available during my care of the patient were reviewed by me and considered in my medical decision making (see chart for details).    MDM Rules/Calculators/A&P                           Patient presents to the ED with abscess amenable to I&D based on exam. Procedure per  note above. Abscess cavity did not seem large enough to warrant packing/drain placement. Given mild surrounding cellulitis will start patient on Doxycycline. Recommended application of warm compresses/soaks/flushing. Will have patient return for wound recheck in 2 days. I discussed treatment plan, need for follow-up, and return precautions with the patient. Provided opportunity for questions, patient confirmed understanding and is in agreement with plan.   Patient had a CBC ordered in triage with incidental finding of anemia, with hemoglobin of 7.9, most recent comparison from 2019 which was 11.7.  Patient has not had any dark  black stools or blood in her stool, reports she has had a history of heavy vaginal bleeding and has known history of anemia, is not having any fatigue, lightheadedness, near syncope, chest pain or shortness of breath.  We will have her follow-up with her PCP for further evaluation of this anemia, and will start the patient on daily iron supplements.  She expresses understanding and agreement.   Final Clinical Impression(s) / ED Diagnoses Final diagnoses:  Abscess  Anemia, unspecified type    Rx / DC Orders ED Discharge Orders          Ordered    doxycycline (VIBRAMYCIN) 100 MG capsule  2 times daily,   Status:  Discontinued        10/21/20 1537    ferrous sulfate 325 (65 FE) MG tablet  Daily        10/21/20 1537    doxycycline (VIBRAMYCIN) 100 MG capsule  2 times daily        10/21/20 1548             Jodi Geralds [place], [place] 10/23/20 2952    Derwood Kaplan, MD 10/23/20 [phone]

## 2020-10-21 NOTE — ED Provider Notes (Signed)
Emergency Medicine Provider Triage Evaluation Note  Rebecca Holmes , a 45 y.o. female  was evaluated in triage.  Pt complains of left-sided groin abscess.  No fevers or chills.  No history of this.  Not diabetic.  Review of Systems  Positive: Abscess, discomfort Negative: Fevers, chills, urinary symptoms or abnormal vaginal discharge  Physical Exam  BP (!) 142/99 (BP Location: Left Arm)   Pulse (!) 105   Temp 98 F (36.7 C) (Oral)   Resp 16   Ht 5\' 7"  (1.702 m)   Wt 64.4 kg   LMP 10/03/2020   SpO2 92%   BMI 22.24 kg/m  Gen:   Awake, no distress   Resp:  Normal effort  MSK:   Moves extremities without difficulty  Other:  Large abscess noted to left inguinal fold.  Surrounding erythema  Medical Decision Making  Medically screening exam initiated at 1:21 PM.  Appropriate orders placed.  Rebecca Holmes 10/05/2020 was informed that the remainder of the evaluation will be completed by another provider, this initial triage assessment does not replace that evaluation, and the importance of remaining in the ED until their evaluation is complete.     Rebecca Benes, PA-C 10/21/20 1324    10/23/20, MD 10/21/20 1557

## 2020-10-21 NOTE — ED Notes (Signed)
Light green top tube sent to lab 

## 2020-10-21 NOTE — ED Triage Notes (Addendum)
Pt states she has a "cyst" on her left upper thigh.

## 2020-10-21 NOTE — Discharge Instructions (Addendum)
You were seen in the emergency department for a skin abscess- please see the attached handout for further information regarding this diagnoses. This area was incised and drained to help release the bacteria. We would like you to apply warm compresses and warm flushes to this area 4-5 times per day to help facilitate further draining as needed. We are also starting you on doxycycline, an antibiotic, in order to help treat the infection.   We have prescribed you new medication(s) today. Discuss the medications prescribed today with your pharmacist as they can have adverse effects and interactions with your other medicines including over the counter and prescribed medications. Seek medical evaluation if you start to experience new or abnormal symptoms after taking one of these medicines, seek care immediately if you start to experience difficulty breathing, feeling of your throat closing, facial swelling, or rash as these could be indications of a more serious allergic reaction  We would like you to have this area rechecked within 48 hours- please return to the ER , go to an urgent care, or see your primary care provider for this. Return to the ER sooner for new or worsening symptoms including, but not limited to increased pain, spreading redness, fevers, inability to keep fluids down, or any other concerns that you may have.   Your hemoglobin was low today at 7.9, please begin taking iron daily and follow-up closely with your regular doctor for further evaluation of anemia.  You can call the highlighted phone number on your paperwork today for help with establishing care with a new primary care doctor

## 2020-10-27 ENCOUNTER — Emergency Department (HOSPITAL_COMMUNITY)
Admission: EM | Admit: 2020-10-27 | Discharge: 2020-10-27 | Disposition: A | Discharge status: Home / Self Care | Payer: Medicaid Other | Attending: Emergency Medicine | Admitting: Emergency Medicine

## 2020-10-27 ENCOUNTER — Encounter (HOSPITAL_COMMUNITY): Payer: Self-pay | Admitting: Emergency Medicine

## 2020-10-27 ENCOUNTER — Emergency Department (HOSPITAL_COMMUNITY): Payer: Medicaid Other

## 2020-10-27 DIAGNOSIS — R42 Dizziness and giddiness: Secondary | ICD-10-CM | POA: Insufficient documentation

## 2020-10-27 DIAGNOSIS — H5789 Other specified disorders of eye and adnexa: Secondary | ICD-10-CM | POA: Diagnosis not present

## 2020-10-27 DIAGNOSIS — I110 Hypertensive heart disease with heart failure: Secondary | ICD-10-CM | POA: Insufficient documentation

## 2020-10-27 DIAGNOSIS — I509 Heart failure, unspecified: Secondary | ICD-10-CM | POA: Insufficient documentation

## 2020-10-27 DIAGNOSIS — M542 Cervicalgia: Secondary | ICD-10-CM | POA: Insufficient documentation

## 2020-10-27 DIAGNOSIS — Z91010 Allergy to peanuts: Secondary | ICD-10-CM | POA: Insufficient documentation

## 2020-10-27 DIAGNOSIS — Z7951 Long term (current) use of inhaled steroids: Secondary | ICD-10-CM | POA: Insufficient documentation

## 2020-10-27 DIAGNOSIS — Y92410 Unspecified street and highway as the place of occurrence of the external cause: Secondary | ICD-10-CM | POA: Insufficient documentation

## 2020-10-27 DIAGNOSIS — J4530 Mild persistent asthma, uncomplicated: Secondary | ICD-10-CM | POA: Insufficient documentation

## 2020-10-27 DIAGNOSIS — R519 Headache, unspecified: Secondary | ICD-10-CM | POA: Insufficient documentation

## 2020-10-27 DIAGNOSIS — M546 Pain in thoracic spine: Secondary | ICD-10-CM | POA: Diagnosis not present

## 2020-10-27 DIAGNOSIS — Z79899 Other long term (current) drug therapy: Secondary | ICD-10-CM | POA: Insufficient documentation

## 2020-10-27 DIAGNOSIS — F17210 Nicotine dependence, cigarettes, uncomplicated: Secondary | ICD-10-CM | POA: Diagnosis not present

## 2020-10-27 MED ORDER — METHOCARBAMOL 500 MG PO TABS: 500.0000 mg | ORAL_TABLET | Freq: Three times a day (TID) | ORAL | 0 refills | Status: DC | PRN

## 2020-10-27 MED ORDER — METHOCARBAMOL 500 MG PO TABS
500.0000 mg | ORAL_TABLET | Freq: Once | ORAL | Status: AC
  Administered 2020-10-27: 500 mg via ORAL
  Filled 2020-10-27: qty 1

## 2020-10-27 MED ORDER — ACETAMINOPHEN 500 MG PO TABS
1000.0000 mg | ORAL_TABLET | Freq: Once | ORAL | Status: AC
  Administered 2020-10-27: 1000 mg via ORAL
  Filled 2020-10-27: qty 2
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
## 2020-10-27 NOTE — ED Notes (Signed)
An After Visit Summary was printed and given to the patient. Discharge instructions given and no further questions at this time.  

## 2020-10-29 ENCOUNTER — Emergency Department (HOSPITAL_COMMUNITY): Payer: Medicaid Other

## 2020-10-29 ENCOUNTER — Encounter (HOSPITAL_COMMUNITY): Payer: Self-pay

## 2020-10-29 ENCOUNTER — Other Ambulatory Visit: Payer: Self-pay

## 2020-10-29 ENCOUNTER — Emergency Department (HOSPITAL_COMMUNITY)
Admission: EM | Admit: 2020-10-29 | Discharge: 2020-10-29 | Disposition: A | Discharge status: Home / Self Care | Payer: Medicaid Other | Attending: Emergency Medicine | Admitting: Emergency Medicine

## 2020-10-29 DIAGNOSIS — F17210 Nicotine dependence, cigarettes, uncomplicated: Secondary | ICD-10-CM | POA: Insufficient documentation

## 2020-10-29 DIAGNOSIS — I110 Hypertensive heart disease with heart failure: Secondary | ICD-10-CM | POA: Diagnosis not present

## 2020-10-29 DIAGNOSIS — S299XXD Unspecified injury of thorax, subsequent encounter: Secondary | ICD-10-CM | POA: Diagnosis not present

## 2020-10-29 DIAGNOSIS — S199XXD Unspecified injury of neck, subsequent encounter: Secondary | ICD-10-CM | POA: Diagnosis present

## 2020-10-29 DIAGNOSIS — H538 Other visual disturbances: Secondary | ICD-10-CM | POA: Diagnosis not present

## 2020-10-29 DIAGNOSIS — R202 Paresthesia of skin: Secondary | ICD-10-CM | POA: Diagnosis not present

## 2020-10-29 DIAGNOSIS — J4530 Mild persistent asthma, uncomplicated: Secondary | ICD-10-CM | POA: Insufficient documentation

## 2020-10-29 DIAGNOSIS — Z7951 Long term (current) use of inhaled steroids: Secondary | ICD-10-CM | POA: Diagnosis not present

## 2020-10-29 DIAGNOSIS — I509 Heart failure, unspecified: Secondary | ICD-10-CM | POA: Insufficient documentation

## 2020-10-29 MED ORDER — CYCLOBENZAPRINE HCL 10 MG PO TABS
10.0000 mg | ORAL_TABLET | Freq: Once | ORAL | Status: AC
  Administered 2020-10-29: 10 mg via ORAL
  Filled 2020-10-29: qty 1

## 2020-10-29 MED ORDER — IBUPROFEN 800 MG PO TABS
800.0000 mg | ORAL_TABLET | Freq: Once | ORAL | Status: AC
  Administered 2020-10-29: 800 mg via ORAL
  Filled 2020-10-29: qty 1

## 2020-10-29 MED ORDER — CYCLOBENZAPRINE HCL 10 MG PO TABS: 10.0000 mg | ORAL_TABLET | Freq: Two times a day (BID) | ORAL | 0 refills | Status: AC | PRN
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
## 2020-10-29 NOTE — Discharge Instructions (Addendum)
You came to the emergency department today to be evaluated for your neck pain, back pain, and tingling sensation to your lower legs.  Your physical exam was reassuring.  The CT scan of your head showed no acute abnormalities.  I have given you a prescription for Flexeril, please discontinue taking Robaxin.  Please follow-up with your primary care provider if your symptoms do not improve.  I have given you information to follow-up with [facility] for primary care provider as needed.  Please take Ibuprofen (Advil, motrin) and Tylenol (acetaminophen) to relieve your pain.    You may take up to 600 MG (3 pills) of normal strength ibuprofen every 8 hours as needed.   You make take tylenol, up to 1,000 mg (two extra strength pills) every 8 hours as needed.   It is safe to take ibuprofen and tylenol at the same time as they work differently.   Do not take more than 3,000 mg tylenol in a 24 hour period (not more than one dose every 8 hours.  Please check all medication labels as many medications such as pain and cold medications may contain tylenol.  Do not drink alcohol while taking these medications.  Do not take other NSAID'S while taking ibuprofen (such as aleve or naproxen).  Please take ibuprofen with food to decrease stomach upset.   Get help right away if: You have: Numbness, tingling, or weakness in your arms or legs. Severe neck pain, especially tenderness in the middle of the back of your neck. Changes in bowel or bladder control. Increasing pain in any area of your body. Swelling in any area of your body, especially your legs. Shortness of breath or light-headedness. Chest pain. Blood in your urine, stool, or vomit. Severe pain in your abdomen or your back. Severe or worsening headaches. Sudden vision loss or double vision. Your eye suddenly becomes red. Your pupil is an odd shape or size.

## 2020-12-12 ENCOUNTER — Emergency Department (HOSPITAL_COMMUNITY)
Admission: EM | Admit: 2020-12-12 | Discharge: 2020-12-12 | Disposition: A | Discharge status: Home / Self Care | Payer: Medicaid Other | Attending: Emergency Medicine | Admitting: Emergency Medicine

## 2020-12-12 ENCOUNTER — Encounter (HOSPITAL_COMMUNITY): Payer: Self-pay

## 2020-12-12 DIAGNOSIS — R350 Frequency of micturition: Secondary | ICD-10-CM | POA: Diagnosis not present

## 2020-12-12 DIAGNOSIS — R1031 Right lower quadrant pain: Secondary | ICD-10-CM | POA: Diagnosis present

## 2020-12-12 DIAGNOSIS — Z5321 Procedure and treatment not carried out due to patient leaving prior to being seen by health care provider: Secondary | ICD-10-CM | POA: Insufficient documentation

## 2020-12-12 LAB — URINALYSIS, ROUTINE W REFLEX MICROSCOPIC
Bilirubin Urine: NEGATIVE
Glucose, UA: NEGATIVE mg/dL
Hgb urine dipstick: NEGATIVE
Ketones, ur: NEGATIVE mg/dL
Leukocytes,Ua: NEGATIVE
Nitrite: POSITIVE — AB
Protein, ur: NEGATIVE mg/dL
Specific Gravity, Urine: 1.021 (ref 1.005–1.030)
pH: 5 (ref 5.0–8.0)

## 2020-12-12 LAB — CBC WITH DIFFERENTIAL/PLATELET
Abs Immature Granulocytes: 0.01 10*3/uL (ref 0.00–0.07)
Basophils Absolute: 0 10*3/uL (ref 0.0–0.1)
Basophils Relative: 0 %
Eosinophils Absolute: 0.2 10*3/uL (ref 0.0–0.5)
Eosinophils Relative: 2 %
HCT: 28.7 % — ABNORMAL LOW (ref 36.0–46.0)
Hemoglobin: 8.1 g/dL — ABNORMAL LOW (ref 12.0–15.0)
Immature Granulocytes: 0 %
Lymphocytes Relative: 32 %
Lymphs Abs: 2.1 10*3/uL (ref 0.7–4.0)
MCH: 21 pg — ABNORMAL LOW (ref 26.0–34.0)
MCHC: 28.2 g/dL — ABNORMAL LOW (ref 30.0–36.0)
MCV: 74.4 fL — ABNORMAL LOW (ref 80.0–100.0)
Monocytes Absolute: 0.6 10*3/uL (ref 0.1–1.0)
Monocytes Relative: 9 %
Neutro Abs: 3.7 10*3/uL (ref 1.7–7.7)
Neutrophils Relative %: 57 %
Platelets: 250 10*3/uL (ref 150–400)
RBC: 3.86 MIL/uL — ABNORMAL LOW (ref 3.87–5.11)
RDW: 21.8 % — ABNORMAL HIGH (ref 11.5–15.5)
WBC: 6.6 10*3/uL (ref 4.0–10.5)
nRBC: 0 % (ref 0.0–0.2)

## 2020-12-12 LAB — BASIC METABOLIC PANEL
Anion gap: 8 (ref 5–15)
BUN: 17 mg/dL (ref 6–20)
CO2: 25 mmol/L (ref 22–32)
Calcium: 9.5 mg/dL (ref 8.9–10.3)
Chloride: 107 mmol/L (ref 98–111)
Creatinine, Ser: 0.76 mg/dL (ref 0.44–1.00)
GFR, Estimated: >60 mL/min (ref >60)
Glucose, Bld: 78 mg/dL (ref 70–99)
Potassium: 4 mmol/L (ref 3.5–5.1)
Sodium: 140 mmol/L (ref 135–145)

## 2020-12-12 LAB — POC URINE PREG, ED: Preg Test, Ur: NEGATIVE

## 2020-12-12 NOTE — ED Triage Notes (Signed)
Pt presents with c/o right flank pain that started 4-5 days ago. Pt reports hx of kidney infections, no hx of kidney stones. Pt denies any hematuria at this time.

## 2020-12-12 NOTE — ED Notes (Signed)
Patient has a urine culture in the main lab 

## 2020-12-12 NOTE — ED Provider Notes (Signed)
Emergency Medicine Provider Triage Evaluation Note  Rebecca Holmes , a 45 y.o. female  was evaluated in triage.  Pt complains of R flank pain x 5 days as well as urinary frequency. Hx of pyelonephritis. No fevers or chills. No nausea or vomiting. No blood in urine. No history of kidney stones.  Review of Systems  Positive: + R flank pain, frequency Negative: - hematuria, dysuria, nausea, vomiting, fevers  Physical Exam  BP (!) 127/97   Pulse 82   Temp 98.9 F (37.2 C) (Oral)   Resp 16   LMP 11/28/2020 (Approximate)   SpO2 100%  Gen:   Awake, no distress   Resp:  Normal effort  MSK:   Moves extremities without difficulty  Other:  R flank TTP  Medical Decision Making  Medically screening exam initiated at 12:06 PM.  Appropriate orders placed.  Rebecca Holmes was informed that the remainder of the evaluation will be completed by another provider, this initial triage assessment does not replace that evaluation, and the importance of remaining in the ED until their evaluation is complete.     Tanda Rockers, PA-C 12/12/20 1207    Linwood Dibbles, MD 12/14/20 1009

## 2021-03-11 ENCOUNTER — Other Ambulatory Visit: Payer: Self-pay | Admitting: Internal Medicine

## 2021-03-31 ENCOUNTER — Telehealth: Payer: Self-pay | Admitting: Internal Medicine

## 2021-03-31 ENCOUNTER — Other Ambulatory Visit: Payer: Self-pay | Admitting: Internal Medicine

## 2021-03-31 MED ORDER — BUDESONIDE-FORMOTEROL FUMARATE 160-4.5 MCG/ACT IN AERO: INHALATION_SPRAY | RESPIRATORY_TRACT | 0 refills | Status: AC

## 2021-03-31 NOTE — Telephone Encounter (Signed)
Called and spoke with patient. I let her know that the reason for the denial on her refill of symbicort was because she hasn't had an office visit in a year. I scheduled patient for a f/u visit on march 27th with MW.  ? ?Symbicort was sent in to the pharmacy. Nothing further needed.  ?

## 2021-03-31 NOTE — Addendum Note (Signed)
Addended by: Arlyss Repress on: 03/31/2021 04:18 PM ? ? Modules accepted: Orders ? ?

## 2021-04-02 NOTE — Progress Notes (Deleted)
? ?Subjective:  ? ? Patient ID: Rebecca BoresAnnajo O Holmes, female    DOB: 11/13/1975    MRN: [id number] ? ?  ?Brief patient profile:  ?[age] yobf active smoker with childhood asthma never completely resolved and required ET 2010 last seen in pulmonary clinic 2011 with nl pfts and despite continue smoking since then had completely nl pfts p saba again 05/11/2015  ? ? ? ?  ?05/11/2015  f/u ov/Rebecca Holmes re: mild chronic asthma/ maint rx symb 160 2bid  ?Chief Complaint  ?Patient presents with  ? Follow-up  ?  PFT done today. Pt states that her breathing is overall doing well. She is using proair 2 x per wk on average and has not needed neb.   ?rarely needs saba as long as has symb 160 despite active smoking ?rec ?Plan A = Automatic = Symbicort 160 Take 2 puffs first thing in am and then another 2 puffs about 12 hours later.  ?The key is to stop smoking completely before smoking completely stops you!  ?Work on inhaler technique:  relax and gently blow all the way out then take a nice smooth deep breath back in, triggering the inhaler at same time you start breathing in.  Hold for up to 5 seconds if you can. Blow out thru nose. Rinse and gargle with water when done ?Plan B = Backup ?Only use your albuterol (Proair) as a rescue medication ?Plan C = Crisis ?- only use your albuterol nebulizer if you first try Plan B and it fails to help > ok to use the nebulizer up to every 4 hours but if start needing it regularly call for immediate appointment ? ? ? ?Admit date: 03/18/2017 ?Discharge date: 03/21/2017 ?  ?   ?Brief/Interim Summary: ?46 y.o. female past medical history asthma with a history of intubation in 2010, heart failure with an EF of 55-60% in 2015, comes into the hospital for shortness of breath, fever and nonproductive cough ?  ?Discharge Diagnoses:  ?Active Problems: ?  Essential hypertension ?  Acute on chronic respiratory failure with hypoxia (HCC) ?  Depression, controlled ?  Asthma attack ?  ?Acute respiratory failure with hypoxia due  to asthma attack: ?On admission she was placed on the stepdown on BiPAP she was started on IV steroids and inhalers. ?Chest x-ray shows no infiltrates she remained afebrile with no leukocytosis she would continue steroid tapers and inhalers at home. ?  ?Essential hypertension: ?No changes were made to her medication. ?  ?Depression, controlled: ?No changes were made to her medication. ?  ?  ?Discharge Instructions ?  ?    ?Discharge Instructions   ?  Diet - low sodium heart healthy   Complete by:  As directed ?   ?  Increase activity slowly   Complete by:  As directed ?   ?   ?  ?    ?Allergies as of 03/21/2017   ?    Reactions  ?  Peanut-containing Drug Products Swelling  ?  REACTION: swell up  ?    ?   ?   ?    ?Medication List  ?    ?STOP taking these medications   ?azithromycin 250 MG tablet ?Commonly known as:  ZITHROMAX ?   ?clindamycin 75 MG/5ML solution ?Commonly known as:  CLEOCIN ?   ?   ?TAKE these medications   ?albuterol (2.5 MG/3ML) 0.083% nebulizer solution ?Commonly known as:  PROVENTIL ?Take 3 mLs (2.5 mg total) by nebulization every 6 (six)  hours as needed for wheezing or shortness of breath. ?   ?albuterol 108 (90 Base) MCG/ACT inhaler ?Commonly known as:  PROAIR HFA ?Inhale 2 puffs into the lungs every 6 (six) hours as needed for wheezing or shortness of breath. ?   ?budesonide-formoterol 160-4.5 MCG/ACT inhaler ?Commonly known as:  SYMBICORT ?INHALE 2 PUFFS FIRST THING IN THE MORNING AND THEN 12 HOURS LATER ?   ?diclofenac sodium 1 % Gel ?Commonly known as:  VOLTAREN ?Apply 2 g topically 4 (four) times daily. ?   ?diphenhydrAMINE 25 MG tablet ?Commonly known as:  BENADRYL ?Take 1 tablet (25 mg total) by mouth every 6 (six) hours. ?   ?DULoxetine 60 MG capsule ?Commonly known as:  CYMBALTA ?Take 1 capsule (60 mg total) by mouth every morning. ?   ?EPIPEN 2-PAK 0.3 mg/0.3 mL Soaj injection ?Generic drug:  EPINEPHrine ?Inject 1 Device into the muscle daily as needed. For nut allergy ?   ?famotidine  20 MG tablet ?Commonly known as:  PEPCID ?Take 1 tablet (20 mg total) by mouth daily. ?   ?HYDROcodone-acetaminophen 7.5-325 mg/15 ml solution ?Commonly known as:  HYCET ?Take 5 mLs by mouth every 6 (six) hours as needed for moderate pain or severe pain. ?   ?loratadine 10 MG tablet ?Commonly known as:  CLARITIN ?Take 1 tablet (10 mg total) by mouth daily. ?   ?losartan 25 MG tablet ?Commonly known as:  COZAAR ?Take 1 tablet (25 mg total) by mouth daily. ?   ?predniSONE 10 MG tablet ?Commonly known as:  DELTASONE ?Takes 5 tablets for 1 days, then 4 tablets for 1 days, then 3 tablets for 1 days, then 2 tabs for 1 days, then 1 tab for 1 days, and then stop. ?What changed:   ?medication strength ?how much to take ?how to take this ?when to take this ?additional instructions  ? ? ? ?  ? ? ?05/06/2017  f/u ov/Rebecca Holmes re:  Chronic asthma/ 1 cig q 4 days ?Chief Complaint  ?Patient presents with  ? Hospitalization Follow-up  ?  admitted to hospital with asthma flare 03/18/17 to 03/21/17. She states her breathing seems okay today. She does get worse with warmer weather. She is not using her albuterol inhaler, but is using neb every day at least 2 x daily and occ wakes up in the night and has to use.   ?doe =  MMRC3 = can't walk 100 yards even at a slow pace at a flat grade s stopping due to sob / legs hurting  ?Cough better now ?Sleeping trouble lying flat due to sob p several min  ?Misunderstood how to use maint vs prns and still overusing saba  ?Admits breathing was going downhill x 3 weeks prior to req admit and did not contact this office as instructed when saba use increased. ?rec ?Plan A = Automatic = Symbicort 160 Take 2 puffs first thing in am and then another 2 puffs about 12 hours later.  ?Work on inhaler technique:   ?Plan B = Backup ?Only use your albuterol as a rescue medication to be used if you can't catch your breath ?Plan C = Crisis ?- only use your albuterol nebulizer if you first try Plan B and it fails to help  > ok to use the nebulizer up to every 4 hours but if start needing it regularly call for immediate appointment ?Please schedule a follow up office visit in 4 weeks, sooner if needed  with all medications /inhalers/ solutions in hand  so we can verify exactly what you are taking. This includes all medications from all doctors and over the counters ? -with PFTs on return  ? ? ? ? ?03/25/2020  f/u ov/Rebecca Holmes re: asthma/ smoking  ?Chief Complaint  ?Patient presents with  ? Follow-up  ?  Breathing is "okay"- she is needing refills on her symbicort, albuterol inhaler/neb and montelukast. She has been out of inhaled meds for 3 days. Using saline neb sol about 2 x per wk.   ?Dyspnea:  Much better while on symbicort and when on it only uses  hfa twice a week  ?Cough: none  ?Sleeping: on side flat bed no pillows  ?SABA use: as above  ?02: none  ?Covid status:   Vac x 2  ?Hb symptoms better prevacid   ?Rec ?rec Go ahead and take the 3rd pfizer 6 months after last shot ?Plan A = Automatic = Always=    symbicort 160 Take 2 puffs first thing in am and then another 2 puffs about 12 hours later.  ?Work on inhaler technique:   ?  Plan B = Backup (to supplement plan A, not to replace it) ?Only use your albuterol inhaler as a rescue medication to be used if you can't catch your breath ?Plan C = Crisis (instead of Plan B but only if Plan B stops working) ?- only use your albuterol nebulizer if you first try Plan B  ? The key is to stop smoking completely before smoking completely stops you! ?Please schedule a follow up visit in 3 months but call sooner if needed with pfts - also bring inhalers ? ? ?04/03/2021  f/u ov/Rebecca Holmes re: ***   maint on ***  ?No chief complaint on file. ?  ?Dyspnea:  *** ?Cough: *** ?Sleeping: *** ?SABA use: *** ?02: *** ?Covid status:   *** ? ? ?No obvious day to day or daytime variability or assoc excess/ purulent sputum or mucus plugs or hemoptysis or cp or chest tightness, subjective wheeze or overt sinus or hb  symptoms.  ? ?*** without nocturnal  or early am exacerbation  of respiratory  c/o's or need for noct saba. Also denies any obvious fluctuation of symptoms with weather or environmental changes or other agg

## 2021-04-03 ENCOUNTER — Ambulatory Visit: Payer: Medicaid Other | Admitting: Internal Medicine

## 2021-08-11 ENCOUNTER — Emergency Department (HOSPITAL_COMMUNITY): Payer: No Typology Code available for payment source

## 2021-08-11 ENCOUNTER — Other Ambulatory Visit: Payer: Self-pay

## 2021-08-11 ENCOUNTER — Encounter (HOSPITAL_COMMUNITY): Payer: Self-pay

## 2021-08-11 ENCOUNTER — Emergency Department (HOSPITAL_COMMUNITY)
Admission: EM | Admit: 2021-08-11 | Discharge: 2021-08-11 | Disposition: A | Discharge status: Home / Self Care | Payer: No Typology Code available for payment source | Attending: Emergency Medicine | Admitting: Emergency Medicine

## 2021-08-11 DIAGNOSIS — K5900 Constipation, unspecified: Secondary | ICD-10-CM | POA: Diagnosis not present

## 2021-08-11 DIAGNOSIS — Z91040 Latex allergy status: Secondary | ICD-10-CM | POA: Diagnosis not present

## 2021-08-11 DIAGNOSIS — Z20822 Contact with and (suspected) exposure to covid-19: Secondary | ICD-10-CM | POA: Insufficient documentation

## 2021-08-11 DIAGNOSIS — Z7951 Long term (current) use of inhaled steroids: Secondary | ICD-10-CM | POA: Diagnosis not present

## 2021-08-11 DIAGNOSIS — N12 Tubulo-interstitial nephritis, not specified as acute or chronic: Secondary | ICD-10-CM

## 2021-08-11 DIAGNOSIS — N10 Acute tubulo-interstitial nephritis: Secondary | ICD-10-CM | POA: Insufficient documentation

## 2021-08-11 DIAGNOSIS — R109 Unspecified abdominal pain: Secondary | ICD-10-CM | POA: Diagnosis present

## 2021-08-11 DIAGNOSIS — J45909 Unspecified asthma, uncomplicated: Secondary | ICD-10-CM | POA: Insufficient documentation

## 2021-08-11 LAB — COMPREHENSIVE METABOLIC PANEL
ALT: 12 U/L (ref 0–44)
AST: 32 U/L (ref 15–41)
Albumin: 4.1 g/dL (ref 3.5–5.0)
Alkaline Phosphatase: 58 U/L (ref 38–126)
Anion gap: 5 (ref 5–15)
BUN: 14 mg/dL (ref 6–20)
CO2: 24 mmol/L (ref 22–32)
Calcium: 9.8 mg/dL (ref 8.9–10.3)
Chloride: 109 mmol/L (ref 98–111)
Creatinine, Ser: 0.94 mg/dL (ref 0.44–1.00)
GFR, Estimated: >60 mL/min (ref >60)
Glucose, Bld: 51 mg/dL — ABNORMAL LOW (ref 70–99)
Potassium: 5 mmol/L (ref 3.5–5.1)
Sodium: 138 mmol/L (ref 135–145)
Total Bilirubin: 0.4 mg/dL (ref 0.3–1.2)
Total Protein: 8.2 g/dL — ABNORMAL HIGH (ref 6.5–8.1)

## 2021-08-11 LAB — CBC WITH DIFFERENTIAL/PLATELET
Abs Immature Granulocytes: 0.01 10*3/uL (ref 0.00–0.07)
Basophils Absolute: 0 10*3/uL (ref 0.0–0.1)
Basophils Relative: 0 %
Eosinophils Absolute: 0.1 10*3/uL (ref 0.0–0.5)
Eosinophils Relative: 2 %
HCT: 33.6 % — ABNORMAL LOW (ref 36.0–46.0)
Hemoglobin: 9.8 g/dL — ABNORMAL LOW (ref 12.0–15.0)
Immature Granulocytes: 0 %
Lymphocytes Relative: 28 %
Lymphs Abs: 2.4 10*3/uL (ref 0.7–4.0)
MCH: 21.2 pg — ABNORMAL LOW (ref 26.0–34.0)
MCHC: 29.2 g/dL — ABNORMAL LOW (ref 30.0–36.0)
MCV: 72.7 fL — ABNORMAL LOW (ref 80.0–100.0)
Monocytes Absolute: 0.6 10*3/uL (ref 0.1–1.0)
Monocytes Relative: 7 %
Neutro Abs: 5.6 10*3/uL (ref 1.7–7.7)
Neutrophils Relative %: 63 %
Platelets: 387 10*3/uL (ref 150–400)
RBC: 4.62 MIL/uL (ref 3.87–5.11)
RDW: 21.8 % — ABNORMAL HIGH (ref 11.5–15.5)
WBC: 8.8 10*3/uL (ref 4.0–10.5)
nRBC: 0 % (ref 0.0–0.2)

## 2021-08-11 LAB — URINALYSIS, ROUTINE W REFLEX MICROSCOPIC
Bilirubin Urine: NEGATIVE
Glucose, UA: NEGATIVE mg/dL
Ketones, ur: NEGATIVE mg/dL
Nitrite: POSITIVE — AB
Protein, ur: NEGATIVE mg/dL
Specific Gravity, Urine: 1.011 (ref 1.005–1.030)
pH: 6 (ref 5.0–8.0)

## 2021-08-11 LAB — I-STAT BETA HCG BLOOD, ED (MC, WL, AP ONLY): I-stat hCG, quantitative: <5 m[IU]/mL (ref <5)

## 2021-08-11 LAB — RESP PANEL BY RT-PCR (FLU A&B, COVID) ARPGX2
Influenza A by PCR: NEGATIVE
Influenza B by PCR: NEGATIVE
SARS Coronavirus 2 by RT PCR: NEGATIVE

## 2021-08-11 LAB — LIPASE, BLOOD: Lipase: 33 U/L (ref 11–51)

## 2021-08-11 LAB — CBG MONITORING, ED: Glucose-Capillary: 81 mg/dL (ref 70–99)

## 2021-08-11 MED ORDER — HYDROMORPHONE HCL 1 MG/ML IJ SOLN
1.0000 mg | Freq: Once | INTRAMUSCULAR | Status: AC
  Administered 2021-08-11: 1 mg via INTRAVENOUS
  Filled 2021-08-11: qty 1

## 2021-08-11 MED ORDER — IOHEXOL 300 MG/ML  SOLN
100.0000 mL | Freq: Once | INTRAMUSCULAR | Status: AC | PRN
  Administered 2021-08-11: 100 mL via INTRAVENOUS

## 2021-08-11 MED ORDER — SODIUM CHLORIDE 0.9 % IV BOLUS
1000.0000 mL | Freq: Once | INTRAVENOUS | Status: AC
  Administered 2021-08-11: 1000 mL via INTRAVENOUS

## 2021-08-11 MED ORDER — HYDROCODONE-ACETAMINOPHEN 5-325 MG PO TABS
1.0000 | ORAL_TABLET | Freq: Once | ORAL | Status: DC
  Filled 2021-08-11: qty 1

## 2021-08-11 MED ORDER — SODIUM CHLORIDE 0.9 % IV SOLN
1.0000 g | Freq: Once | INTRAVENOUS | Status: AC
  Administered 2021-08-11: 1 g via INTRAVENOUS
  Filled 2021-08-11: qty 10

## 2021-08-11 MED ORDER — ONDANSETRON 4 MG PO TBDP
4.0000 mg | ORAL_TABLET | Freq: Once | ORAL | Status: DC
  Filled 2021-08-11: qty 1

## 2021-08-11 MED ORDER — DOCUSATE SODIUM 100 MG PO CAPS: 100.0000 mg | ORAL_CAPSULE | Freq: Two times a day (BID) | ORAL | 0 refills | Status: AC

## 2021-08-11 MED ORDER — POLYETHYLENE GLYCOL 3350 17 G PO PACK: 17.0000 g | PACK | Freq: Every day | ORAL | 0 refills | Status: AC

## 2021-08-11 MED ORDER — CEFDINIR 300 MG PO CAPS: 300.0000 mg | ORAL_CAPSULE | Freq: Two times a day (BID) | ORAL | 0 refills | Status: AC

## 2021-08-11 NOTE — ED Provider Notes (Signed)
[facility]-EMERGENCY DEPT Provider Note   CSN: [id number] Arrival date & time: 08/11/21  1602     History  Chief Complaint  Patient presents with   Flank Pain    Rebecca Holmes is a 46 y.o. female.  HPI 46 year old female with a history of asthma presents with flank and abdominal pain.  Started yesterday but was not this bad until today.  No vomiting, dysuria, vaginal symptoms or fever.  She has been dealing with chest congestion and some cough with shortness of breath for about a week.  She is concerned about a recurrent kidney infection, which she last had about 15 years ago.  Pain is currently severe.  Has not tried any specific medicines for this.  Home Medications Prior to Admission medications   Medication Sig Start Date End Date Taking? Authorizing Provider  albuterol (PROVENTIL) (2.5 MG/3ML) 0.083% nebulizer solution Take 3 mLs (2.5 mg total) by nebulization every 4 (four) hours as needed for wheezing or shortness of breath. 03/25/20  Yes Nyoka Cowden, MD  albuterol (VENTOLIN HFA) 108 (90 Base) MCG/ACT inhaler Inhale 2 puffs into the lungs every 4 (four) hours as needed for wheezing or shortness of breath. 03/25/20  Yes Nyoka Cowden, MD  cefdinir (OMNICEF) 300 MG capsule Take 1 capsule (300 mg total) by mouth 2 (two) times daily for 10 days. 08/11/21 08/21/21 Yes Pricilla Loveless, MD  Chlorphen-Pseudoephed-APAP (CORICIDIN D PO) Take 1 tablet by mouth daily as needed (chest congestion).   Yes [provider]  docusate sodium (COLACE) 100 MG capsule Take 1 capsule (100 mg total) by mouth every 12 (twelve) hours. 08/11/21  Yes Pricilla Loveless, MD  EPIPEN 2-PAK 0.3 MG/0.3ML SOAJ injection Inject 1 Device into the muscle daily as needed for anaphylaxis. For nut allergy 12/29/14  Yes [provider]  losartan (COZAAR) 25 MG tablet Take 1 tablet (25 mg total) by mouth daily. 05/30/17  Yes Rice, Jamesetta Orleans, MD  polyethylene glycol (MIRALAX /  GLYCOLAX) 17 g packet Take 17 g by mouth daily. 08/11/21  Yes Pricilla Loveless, MD  budesonide-formoterol (SYMBICORT) 160-4.5 MCG/ACT inhaler INHALE 2 PUFFS FIRST THING IN THE MORNING AND THEN 12 HOURS LATER Patient not taking: Reported on 08/11/2021 03/31/21   Nyoka Cowden, MD  cyclobenzaprine (FLEXERIL) 10 MG tablet Take 1 tablet (10 mg total) by mouth 2 (two) times daily as needed for muscle spasms. Patient not taking: Reported on 08/11/2021 10/29/20   Haskel Schroeder, PA-C  diclofenac sodium (VOLTAREN) 1 % GEL Apply 2 g topically 4 (four) times daily. Patient not taking: Reported on 08/11/2021 03/22/15   Onnie Boer, MD  diphenhydrAMINE (BENADRYL) 25 MG tablet Take 1 tablet (25 mg total) by mouth every 6 (six) hours. Patient not taking: Reported on 08/11/2021 02/21/15   Marlon Pel, PA-C  doxycycline (VIBRAMYCIN) 100 MG capsule Take 1 capsule (100 mg total) by mouth 2 (two) times daily. One po bid x 7 days Patient not taking: Reported on 08/11/2021 10/21/20   Dartha Lodge, PA-C  DULoxetine (CYMBALTA) 30 MG capsule Take 1 capsule (30 mg total) by mouth daily. Patient not taking: Reported on 08/11/2021 05/30/17   Fuller Plan, MD  famotidine (PEPCID) 20 MG tablet Take 1 tablet (20 mg total) by mouth daily. Patient not taking: Reported on 08/11/2021 03/21/17   Marinda Elk, MD  ferrous sulfate 325 (65 FE) MG tablet Take 1 tablet (325 mg total) by mouth daily. Patient not taking: Reported on  08/11/2021 10/21/20   Dartha Lodge, PA-C  loratadine (CLARITIN) 10 MG tablet Take 1 tablet (10 mg total) by mouth daily. Patient not taking: Reported on 08/11/2021 02/21/15   Marlon Pel, PA-C  montelukast (SINGULAIR) 10 MG tablet Take 1 tablet (10 mg total) by mouth at bedtime. Patient not taking: Reported on 08/11/2021 03/25/20   Nyoka Cowden, MD      Allergies    Peanut-containing drug products    Review of Systems   Review of Systems  Constitutional:  Negative for fever.  HENT:   Positive for congestion.   Respiratory:  Positive for cough and shortness of breath.   Gastrointestinal:  Positive for abdominal pain. Negative for vomiting.  Genitourinary:  Positive for flank pain. Negative for dysuria.    Physical Exam Updated Vital Signs BP (!) 132/94   Pulse 62   Temp 98.3 F (36.8 C) (Oral)   Resp 14   Ht 5\' 7"  (1.702 m)   SpO2 98%   BMI 24.43 kg/m  Physical Exam Vitals and nursing note reviewed.  Constitutional:      Appearance: She is well-developed. She is not ill-appearing or diaphoretic.  HENT:     Head: Normocephalic and atraumatic.  Cardiovascular:     Rate and Rhythm: Normal rate and regular rhythm.     Heart sounds: Normal heart sounds.  Pulmonary:     Effort: Pulmonary effort is normal.     Breath sounds: Wheezing (mild, diffuse) present.  Abdominal:     Palpations: Abdomen is soft.     Tenderness: There is abdominal tenderness in the right upper quadrant, epigastric area and left upper quadrant. There is right CVA tenderness and left CVA tenderness.  Skin:    General: Skin is warm and dry.  Neurological:     Mental Status: She is alert.     ED Results / Procedures / Treatments   Labs (all labs ordered are listed, but only abnormal results are displayed) Labs Reviewed  CBC WITH DIFFERENTIAL/PLATELET - Abnormal; Notable for the following components:      Result Value   Hemoglobin 9.8 (*)    HCT 33.6 (*)    MCV 72.7 (*)    MCH 21.2 (*)    MCHC 29.2 (*)    RDW 21.8 (*)    All other components within normal limits  COMPREHENSIVE METABOLIC PANEL - Abnormal; Notable for the following components:   Glucose, Bld 51 (*)    Total Protein 8.2 (*)    All other components within normal limits  URINALYSIS, ROUTINE W REFLEX MICROSCOPIC - Abnormal; Notable for the following components:   APPearance HAZY (*)    Hgb urine dipstick SMALL (*)    Nitrite POSITIVE (*)    Leukocytes,Ua SMALL (*)    Bacteria, UA FEW (*)    All other components  within normal limits  RESP PANEL BY RT-PCR (FLU A&B, COVID) ARPGX2  URINE CULTURE  LIPASE, BLOOD  I-STAT BETA HCG BLOOD, ED ([facility], [facility], [facility] ONLY)  CBG MONITORING, ED    EKG None  Radiology CT ABDOMEN PELVIS W CONTRAST  Result Date: 08/11/2021 CLINICAL DATA:  Abdominal pain, acute, nonlocalized EXAM: CT ABDOMEN AND PELVIS WITH CONTRAST TECHNIQUE: Multidetector CT imaging of the abdomen and pelvis was performed using the standard protocol following bolus administration of intravenous contrast. RADIATION DOSE REDUCTION: This exam was performed according to the departmental dose-optimization program which includes automated exposure control, adjustment of the mA and/or kV according to patient size and/or  use of iterative reconstruction technique. CONTRAST:  OMNIPAQUE IOHEXOL 300 MG/ML  SOLN COMPARISON:  None Available. FINDINGS: Lower chest: No acute abnormality Hepatobiliary: No focal hepatic abnormality. Gallbladder unremarkable. Pancreas: No focal abnormality or ductal dilatation. Spleen: No focal abnormality.  Normal size. Adrenals/Urinary Tract: Subcentimeter cyst in the upper pole of the left kidney. No follow-up imaging recommended. No stones or hydronephrosis. Adrenal glands and urinary bladder unremarkable. Stomach/Bowel: Gaseous distention of the colon with large stool burden. No bowel obstruction. Stomach and small bowel decompressed, grossly unremarkable. Vascular/Lymphatic: Aortic atherosclerosis. No evidence of aneurysm or adenopathy. Reproductive: Uterus and adnexa unremarkable.  No mass. Other: No free fluid or free air. Musculoskeletal: No acute bony abnormality. IMPRESSION: Gaseous distention of the colon with large stool burden. This could reflect ileus and/or constipation. Aortic atherosclerosis advanced for patient's age. Electronically Signed   By: Charlett Nose M.D.   On: 08/11/2021 18:26   DG Chest Portable 1 View  Result Date: 08/11/2021 CLINICAL DATA:  Cough/congestion EXAM:  PORTABLE CHEST 1 VIEW COMPARISON:  Radiograph 10/09/2019 FINDINGS: No focal consolidation, pleural effusion, or pneumothorax. Normal cardiomediastinal silhouette. No acute osseous abnormality. IMPRESSION: No active disease. Electronically Signed   By: Minerva Fester M.D.   On: 08/11/2021 17:46    Procedures Procedures    Medications Ordered in ED Medications  HYDROmorphone (DILAUDID) injection 1 mg (1 mg Intravenous Given 08/11/21 1718)  sodium chloride 0.9 % bolus 1,000 mL (0 mLs Intravenous Stopped 08/11/21 1900)  cefTRIAXone (ROCEPHIN) 1 g in sodium chloride 0.9 % 100 mL IVPB (0 g Intravenous Stopped 08/11/21 1900)  iohexol (OMNIPAQUE) 300 MG/ML solution 100 mL (100 mLs Intravenous Contrast Given 08/11/21 1807)    ED Course/ Medical Decision Making/ A&P                           Medical Decision Making Amount and/or Complexity of Data Reviewed Labs: ordered.    Details: Normal WBC, chronic anemia.  Normal renal function/creatinine.  Mildly low glucose on CMP but fingerstick is normal.  UA consistent with UTI with positive nitrites Radiology: ordered and independent interpretation performed.    Details: CT shows: Dilation though no obvious obstruction.  Risk OTC drugs. Prescription drug management.   Patient is feeling a lot better after dose of IV Dilaudid and some fluids.  CT images viewed by myself and she has constipation which later the patient endorses she had a bowel movement this morning but does suffer from chronic constipation.  My suspicion is this is not really an ileus or a surgical problem.  Given she just had a BM today I think she can be treated with outpatient constipation treatment and follow-up with her PCP.  From a flank pain perspective, she has no leukocytosis but her urinalysis is concerning for UTI with a positive nitrites.  She has not been taken any over-the-counter meds.  We will send her urine for culture and she was given IV Rocephin.  We will discharge her with  cefdinir.  She otherwise feels well and seems stable for discharge home.  Given return precautions.        Final Clinical Impression(s) / ED Diagnoses Final diagnoses:  Pyelonephritis  Constipation, unspecified constipation type    Rx / DC Orders ED Discharge Orders          Ordered    docusate sodium (COLACE) 100 MG capsule  Every 12 hours        08/11/21 1928  polyethylene glycol (MIRALAX / GLYCOLAX) 17 g packet  Daily        08/11/21 1928    cefdinir (OMNICEF) 300 MG capsule  2 times daily        08/11/21 1928              Pricilla Loveless, MD 08/11/21 1943

## 2021-08-11 NOTE — Discharge Instructions (Addendum)
Your work-up today shows you have a urinary tract infection and probably a kidney infection.  You are being prescribed antibiotics.  Your CT scan today showed that your colon is a little dilated from constipation.  If at any point develop new or worsening abdominal pain, vomiting, inability to swallow, inability to have the bowel movement, or any other new/concerning symptoms then return to the ER for evaluation.  You are being prescribed medicines for constipation but also be sure to increase fluid intake as well as fiber intake.

## 2021-08-11 NOTE — ED Provider Triage Note (Signed)
Emergency Medicine Provider Triage Evaluation Note  Rebecca Holmes , a 46 y.o. female  was evaluated in triage.  Pt complains of "kidney infection".  She is unable to qualify what she means by this but states when this happens she typically takes antibiotics.  However she also states that this episode is worse than prior and location and severity of pain.  She has abdominal pain with this episode.  Pain has been going on for 2 days.  Denies dysuria, hematuria, vaginal discharge, nausea, vomiting, or fever.  Review of Systems  Positive: As above Negative: As above  Physical Exam  BP (!) 120/91   Pulse 77   Temp 98.3 F (36.8 C) (Oral)   Resp 18   Ht 5\' 7"  (1.702 m)   SpO2 100%   BMI 24.43 kg/m  Gen:   Awake, in apparent discomfort Resp:  Normal effort  MSK:   Moves extremities without difficulty Other:  Generalized abdominal pain with guarding.  Bilateral flank tenderness  Medical Decision Making  Medically screening exam initiated at 4:22 PM.  Appropriate orders placed.  Rebecca Holmes was informed that the remainder of the evaluation will be completed by another provider, this initial triage assessment does not replace that evaluation, and the importance of remaining in the ED until their evaluation is complete.     Lamar Benes, PA-C 08/11/21 1625

## 2021-08-11 NOTE — ED Triage Notes (Signed)
Pt reports bilateral kidney pain and abdominal pain that began 2 days ago. Denies N/V, urinary symptoms, and abnormal vaginal discharge/bleeding.

## 2021-08-14 LAB — URINE CULTURE: Culture: >=100000 — AB

## 2021-08-15 ENCOUNTER — Telehealth (HOSPITAL_BASED_OUTPATIENT_CLINIC_OR_DEPARTMENT_OTHER): Payer: Self-pay | Admitting: Emergency Medicine

## 2021-08-15 NOTE — Telephone Encounter (Signed)
Post ED Visit - Positive Culture Follow-up  Culture report reviewed by antimicrobial stewardship pharmacist: [facility] []  , Pharm.D. []  Enzo Bi, .D., BCPS AQ-ID []  Celedonio Miyamoto, Pharm.D., BCPS []  [address], Pharm.D., BCPS []  [place], Garvin Fila.D., BCPS, AAHIVP []  , Pharm.D., BCPS, AAHIVP []  Georgina Pillion, PharmD, BCPS []  , PharmD, BCPS []  [place], PharmD, BCPS []  [place], PharmD []  , PharmD, BCPS []  Estella Husk, PharmD  Pharmacy Team []  Lysle Pearl, PharmD []  , PharmD []  Phillips Climes, PharmD []  , Rph []  Agapito Games) , PharmD []  Verlan Friends, PharmD []  , PharmD []  Mervyn Gay, PharmD []  , PharmD []  Vinnie Level, PharmD []  [facility], PharmD []  , PharmD []  Len Childs, PharmD PharmD   Positive urine culture Treated with cefdinir, organism sensitive to the same and no further patient follow-up is required at this time.  Greer Pickerel 08/15/2021, 9:46 AM

## 2022-04-05 ENCOUNTER — Ambulatory Visit: Payer: No Typology Code available for payment source

## 2022-04-19 ENCOUNTER — Ambulatory Visit: Payer: No Typology Code available for payment source

## 2022-05-01 ENCOUNTER — Ambulatory Visit: Payer: Commercial Managed Care - HMO | Admitting: Internal Medicine

## 2022-05-01 NOTE — Progress Notes (Deleted)
   CC: establish care  HPI:[name] is a 47 y.o. female who presents for evaluation of ***. Please see individual problem based A/P for details.  Asthma - symbicort  HTN - losartan  Anemia microcytic IDA - iron oral  Hematuria Pyelo - repeat UA  Aortic athersclerosis - could check lipid  PAP   Depression, PHQ-9: Based on the patients  score we have ***.  Past Medical History:  Diagnosis Date   Anemia, iron deficiency    Asthma    vs Pseudoasthma from ACE; Trial off ACE > PFT's normal 09/09/09 x DLCO 59 > corrects to 73%   Depression    Heart murmur    HTN (hypertension)    Try off ACE 06/13/2009 due to pseudoasthma   Insomnia    Left ventricular dysfunction    Decreased; Low E 20-25% in the setting of severe illness requiring ICU care 06/2008; Cardiolyte 06/13/2009 normal perfusion w/o significant ischemia or scar; LVEF 38%   Respiratory failure (HCC)    Ventilator Dependent; 2/2 status asthmatics 6/11-18/2010 admit [facility]   Vision disorder    Review of Systems:   ROS   Physical Exam: There were no vitals filed for this visit.   General: *** HEENT: Conjunctiva nl , antiicteric sclerae, moist mucous membranes, no exudate or erythema Cardiovascular: Normal rate, regular rhythm.  No murmurs, rubs, or gallops Pulmonary : Equal breath sounds, No wheezes, rales, or rhonchi Abdominal: soft, nontender,  bowel sounds present Ext: No edema in lower extremities, no tenderness to palpation of lower extremities.   Assessment & Plan:   See Encounters Tab for problem based charting.  Patient {GC/GE:[id number]::"discussed with","seen with"} Dr. {[id number]::"Y. Hoffman","Guilloud","Mullen","Narendra","Raines","Vincent","Williams"}

## 2023-11-22 ENCOUNTER — Other Ambulatory Visit: Payer: Self-pay

## 2023-11-22 ENCOUNTER — Encounter: Payer: Self-pay | Admitting: Pharmacist

## 2024-02-08 ENCOUNTER — Emergency Department (HOSPITAL_COMMUNITY): Payer: MEDICAID

## 2024-02-08 ENCOUNTER — Emergency Department (HOSPITAL_COMMUNITY)
Admission: EM | Admit: 2024-02-08 | Discharge: 2024-02-08 | Disposition: A | Discharge status: Home / Self Care | Payer: MEDICAID

## 2024-02-08 DIAGNOSIS — R1030 Lower abdominal pain, unspecified: Secondary | ICD-10-CM | POA: Insufficient documentation

## 2024-02-08 DIAGNOSIS — R059 Cough, unspecified: Secondary | ICD-10-CM | POA: Insufficient documentation

## 2024-02-08 DIAGNOSIS — J189 Pneumonia, unspecified organism: Secondary | ICD-10-CM

## 2024-02-08 DIAGNOSIS — R109 Unspecified abdominal pain: Secondary | ICD-10-CM

## 2024-02-08 DIAGNOSIS — J45901 Unspecified asthma with (acute) exacerbation: Secondary | ICD-10-CM | POA: Diagnosis not present

## 2024-02-08 DIAGNOSIS — R0602 Shortness of breath: Secondary | ICD-10-CM | POA: Diagnosis present

## 2024-02-08 LAB — BASIC METABOLIC PANEL WITH GFR
Anion gap: 13 (ref 5–15)
BUN: 13 mg/dL (ref 6–20)
CO2: 22 mmol/L (ref 22–32)
Calcium: 11.3 mg/dL — ABNORMAL HIGH (ref 8.9–10.3)
Chloride: 102 mmol/L (ref 98–111)
Creatinine, Ser: 0.92 mg/dL (ref 0.44–1.00)
GFR, Estimated: >60 mL/min
Glucose, Bld: 84 mg/dL (ref 70–99)
Potassium: 4.9 mmol/L (ref 3.5–5.1)
Sodium: 137 mmol/L (ref 135–145)

## 2024-02-08 LAB — HEPATIC FUNCTION PANEL
ALT: 12 U/L (ref 0–44)
AST: 24 U/L (ref 15–41)
Albumin: 4.1 g/dL (ref 3.5–5.0)
Alkaline Phosphatase: 85 U/L (ref 38–126)
Bilirubin, Direct: 0.3 mg/dL — ABNORMAL HIGH (ref 0.0–0.2)
Indirect Bilirubin: 0.5 mg/dL (ref 0.3–0.9)
Total Bilirubin: 0.8 mg/dL (ref 0.0–1.2)
Total Protein: 8.1 g/dL (ref 6.5–8.1)

## 2024-02-08 LAB — CBC WITH DIFFERENTIAL/PLATELET
Abs Immature Granulocytes: 0.04 10*3/uL (ref 0.00–0.07)
Basophils Absolute: 0 10*3/uL (ref 0.0–0.1)
Basophils Relative: 0 %
Eosinophils Absolute: 0 10*3/uL (ref 0.0–0.5)
Eosinophils Relative: 0 %
HCT: 36 % (ref 36.0–46.0)
Hemoglobin: 10.9 g/dL — ABNORMAL LOW (ref 12.0–15.0)
Immature Granulocytes: 0 %
Lymphocytes Relative: 20 %
Lymphs Abs: 2.5 10*3/uL (ref 0.7–4.0)
MCH: 24.6 pg — ABNORMAL LOW (ref 26.0–34.0)
MCHC: 30.3 g/dL (ref 30.0–36.0)
MCV: 81.3 fL (ref 80.0–100.0)
Monocytes Absolute: 1.7 10*3/uL — ABNORMAL HIGH (ref 0.1–1.0)
Monocytes Relative: 14 %
Neutro Abs: 7.8 10*3/uL — ABNORMAL HIGH (ref 1.7–7.7)
Neutrophils Relative %: 66 %
Platelets: 221 10*3/uL (ref 150–400)
RBC: 4.43 MIL/uL (ref 3.87–5.11)
RDW: 20.1 % — ABNORMAL HIGH (ref 11.5–15.5)
WBC: 12.2 10*3/uL — ABNORMAL HIGH (ref 4.0–10.5)
nRBC: 0 % (ref 0.0–0.2)

## 2024-02-08 LAB — RESP PANEL BY RT-PCR (RSV, FLU A&B, COVID)  RVPGX2
Influenza A by PCR: NEGATIVE
Influenza B by PCR: NEGATIVE
Resp Syncytial Virus by PCR: NEGATIVE
SARS Coronavirus 2 by RT PCR: NEGATIVE

## 2024-02-08 LAB — HCG, SERUM, QUALITATIVE: Preg, Serum: NEGATIVE

## 2024-02-08 LAB — LIPASE, BLOOD: Lipase: 12 U/L (ref 11–51)

## 2024-02-08 MED ORDER — SODIUM CHLORIDE 0.9 % IV SOLN
1.0000 g | Freq: Once | INTRAVENOUS | Status: AC
  Administered 2024-02-08: 1 g via INTRAVENOUS
  Filled 2024-02-08: qty 10

## 2024-02-08 MED ORDER — MORPHINE SULFATE (PF) 4 MG/ML IV SOLN
4.0000 mg | Freq: Once | INTRAVENOUS | Status: AC
  Administered 2024-02-08: 4 mg via INTRAVENOUS
  Filled 2024-02-08: qty 1

## 2024-02-08 MED ORDER — IOHEXOL 350 MG/ML SOLN
75.0000 mL | Freq: Once | INTRAVENOUS | Status: AC | PRN
  Administered 2024-02-08: 75 mL via INTRAVENOUS

## 2024-02-08 MED ORDER — IPRATROPIUM-ALBUTEROL 0.5-2.5 (3) MG/3ML IN SOLN
3.0000 mL | Freq: Once | RESPIRATORY_TRACT | Status: AC
  Administered 2024-02-08: 3 mL via RESPIRATORY_TRACT
  Filled 2024-02-08: qty 3

## 2024-02-08 MED ORDER — PREDNISONE 50 MG PO TABS: ORAL_TABLET | ORAL | 0 refills | Status: AC

## 2024-02-08 MED ORDER — METHYLPREDNISOLONE SODIUM SUCC 125 MG IJ SOLR
125.0000 mg | Freq: Once | INTRAMUSCULAR | Status: AC
  Administered 2024-02-08: 125 mg via INTRAVENOUS
  Filled 2024-02-08: qty 2

## 2024-02-08 MED ORDER — ALBUTEROL SULFATE HFA 108 (90 BASE) MCG/ACT IN AERS: 2.0000 | INHALATION_SPRAY | RESPIRATORY_TRACT | 0 refills | Status: AC | PRN

## 2024-02-08 MED ORDER — ONDANSETRON HCL 4 MG/2ML IJ SOLN
4.0000 mg | Freq: Once | INTRAMUSCULAR | Status: AC
  Administered 2024-02-08: 4 mg via INTRAVENOUS
  Filled 2024-02-08: qty 2

## 2024-02-08 MED ORDER — AZITHROMYCIN 250 MG PO TABS
500.0000 mg | ORAL_TABLET | Freq: Once | ORAL | Status: AC
  Administered 2024-02-08: 500 mg via ORAL
  Filled 2024-02-08: qty 2

## 2024-02-08 MED ORDER — AZITHROMYCIN 250 MG PO TABS: 250.0000 mg | ORAL_TABLET | Freq: Every day | ORAL | 0 refills | Status: AC

## 2024-02-08 MED ORDER — LACTATED RINGERS IV BOLUS
1000.0000 mL | Freq: Once | INTRAVENOUS | Status: AC
  Administered 2024-02-08: 1000 mL via INTRAVENOUS

## 2024-02-08 MED ORDER — SODIUM CHLORIDE 0.9 % IV SOLN: 500.0000 mg | Freq: Once | INTRAVENOUS | Status: DC

## 2024-02-08 MED ORDER — ALBUTEROL SULFATE HFA 108 (90 BASE) MCG/ACT IN AERS
2.0000 | INHALATION_SPRAY | Freq: Once | RESPIRATORY_TRACT | Status: AC
  Administered 2024-02-08: 2 via RESPIRATORY_TRACT
  Filled 2024-02-08: qty 6.7

## 2024-02-08 NOTE — ED Triage Notes (Signed)
 Pt to the ed form home via ems with a CC of sob with couch and congestion x 3 days. Pt has hx of asthma and has been using albuterol  inhaler multiple time today without relief. Pt denies cp, dizziness, or fever.

## 2024-02-08 NOTE — ED Provider Notes (Signed)
 " [place] EMERGENCY DEPARTMENT AT [facility] Provider Note   CSN: [id number] Arrival date & time: 02/08/24  1846     Patient presents with: Shortness of Breath   Rebecca Holmes is a 49 y.o. female.   49 year old female with past medical history of tobacco abuse and asthma/chronic bronchitis presenting to the emergency department today with cough and shortness of breath.  The patient states that she has had shortness of breath over the past few days and has not been getting better despite using her albuterol  multiple times per day.  The patient states that she is coughing up some phlegm.  She denies any associated chest pain.  She denies any fevers.  Denies any leg swelling.  She came to the ER today for further evaluation regarding this due to these ongoing symptoms.  She has been hospitalized as well as intubated in the past for her reactive airway disease.   Shortness of Breath Associated symptoms: cough        Prior to Admission medications  Medication Sig Start Date End Date Taking? Authorizing Provider  albuterol  (VENTOLIN  HFA) 108 (90 Base) MCG/ACT inhaler Inhale 2 puffs into the lungs every 4 (four) hours as needed for wheezing or shortness of breath. 02/08/24  Yes Ula Prentice SAUNDERS, MD  azithromycin  (ZITHROMAX ) 250 MG tablet Take 1 tablet (250 mg total) by mouth daily. Take 1 tablet by mouth daily (first dose given in emergency department) 02/08/24  Yes Ula Prentice SAUNDERS, MD  predniSONE  (DELTASONE ) 50 MG tablet Take 1 tablet by mouth daily 02/08/24  Yes Ula Prentice SAUNDERS, MD  albuterol  (PROVENTIL ) (2.5 MG/3ML) 0.083% nebulizer solution Take 3 mLs (2.5 mg total) by nebulization every 4 (four) hours as needed for wheezing or shortness of breath. 03/25/20   Darlean Ozell NOVAK, MD  albuterol  (VENTOLIN  HFA) 108 (90 Base) MCG/ACT inhaler Inhale 2 puffs into the lungs every 4 (four) hours as needed for wheezing or shortness of breath. 03/25/20   Darlean Ozell NOVAK, MD  budesonide -formoterol   (SYMBICORT ) 160-4.5 MCG/ACT inhaler INHALE 2 PUFFS FIRST THING IN THE MORNING AND THEN 12 HOURS LATER Patient not taking: Reported on 08/11/2021 03/31/21   Darlean Ozell NOVAK, MD  Chlorphen-Pseudoephed-APAP (CORICIDIN D PO) Take 1 tablet by mouth daily as needed (chest congestion).    [provider]  cyclobenzaprine  (FLEXERIL ) 10 MG tablet Take 1 tablet (10 mg total) by mouth 2 (two) times daily as needed for muscle spasms. Patient not taking: Reported on 08/11/2021 10/29/20   Badalamente, Peter R, PA-C  diclofenac  sodium (VOLTAREN ) 1 % GEL Apply 2 g topically 4 (four) times daily. Patient not taking: Reported on 08/11/2021 03/22/15   Pearlean Tully BRAVO, MD  diphenhydrAMINE  (BENADRYL ) 25 MG tablet Take 1 tablet (25 mg total) by mouth every 6 (six) hours. Patient not taking: Reported on 08/11/2021 02/21/15   Levora Riggs, PA-C  docusate sodium  (COLACE) 100 MG capsule Take 1 capsule (100 mg total) by mouth every 12 (twelve) hours. 08/11/21   Freddi Hamilton, MD  doxycycline  (VIBRAMYCIN ) 100 MG capsule Take 1 capsule (100 mg total) by mouth 2 (two) times daily. One po bid x 7 days Patient not taking: Reported on 08/11/2021 10/21/20   Alva Larraine FALCON, PA-C  DULoxetine  (CYMBALTA ) 30 MG capsule Take 1 capsule (30 mg total) by mouth daily. Patient not taking: Reported on 08/11/2021 05/30/17   Jeannetta Lonni ORN, MD  EPIPEN  2-PAK 0.3 MG/0.3ML SOAJ injection Inject 1 Device into the muscle daily as needed for  anaphylaxis. For nut allergy 12/29/14   [provider]  famotidine  (PEPCID ) 20 MG tablet Take 1 tablet (20 mg total) by mouth daily. Patient not taking: Reported on 08/11/2021 03/21/17   Odell Celinda Balo, MD  ferrous sulfate  325 (65 FE) MG tablet Take 1 tablet (325 mg total) by mouth daily. Patient not taking: Reported on 08/11/2021 10/21/20   Kehrli, Kelsey F, PA-C  loratadine  (CLARITIN ) 10 MG tablet Take 1 tablet (10 mg total) by mouth daily. Patient not taking: Reported on 08/11/2021 02/21/15    Levora Riggs, PA-C  losartan  (COZAAR ) 25 MG tablet Take 1 tablet (25 mg total) by mouth daily. 05/30/17   Rice, Lonni ORN, MD  montelukast  (SINGULAIR ) 10 MG tablet Take 1 tablet (10 mg total) by mouth at bedtime. Patient not taking: Reported on 08/11/2021 03/25/20   Wert, Michael B, MD  polyethylene glycol (MIRALAX  / GLYCOLAX ) 17 g packet Take 17 g by mouth daily. 08/11/21   Freddi Hamilton, MD    Allergies: Peanut-containing drug products    Review of Systems  Respiratory:  Positive for cough and shortness of breath.   All other systems reviewed and are negative.   Updated Vital Signs BP (!) 113/101   Pulse (!) 112   Temp 99.3 F (37.4 C) (Oral)   Resp (!) 23   Ht 5' 7 (1.702 m)   Wt 70 kg   SpO2 100%   BMI 24.17 kg/m   Physical Exam Vitals and nursing note reviewed.   Gen: Conversational dyspnea noted Eyes: PERRL, EOMI HEENT: no oropharyngeal swelling Neck: trachea midline Resp: Diffuse wheezes throughout all lung fields Card: RRR, no murmurs, rubs, or gallops Abd: nontender, nondistended Extremities: no calf tenderness, no edema Vascular: 2+ radial pulses bilaterally, 2+ DP pulses bilaterally Skin: no rashes Psyc: acting appropriately   (all labs ordered are listed, but only abnormal results are displayed) Labs Reviewed  BASIC METABOLIC PANEL WITH GFR - Abnormal; Notable for the following components:      Result Value   Calcium 11.3 (*)    All other components within normal limits  CBC WITH DIFFERENTIAL/PLATELET - Abnormal; Notable for the following components:   WBC 12.2 (*)    Hemoglobin 10.9 (*)    MCH 24.6 (*)    RDW 20.1 (*)    Neutro Abs 7.8 (*)    Monocytes Absolute 1.7 (*)    All other components within normal limits  RESP PANEL BY RT-PCR (RSV, FLU A&B, COVID)  [id number]  HEPATIC FUNCTION PANEL  LIPASE, BLOOD  HCG, SERUM, QUALITATIVE    EKG: EKG Interpretation Date/Time:  Saturday February 08 2024 19:26:49 EST Ventricular Rate:  111 PR  Interval:  115 QRS Duration:  94 QT Interval:  319 QTC Calculation: 434 R Axis:   -31  Text Interpretation: Sinus tachycardia Left axis deviation Confirmed by Ula Barter [phone]) on 02/08/2024 7:37:16 PM  Radiology: [facility] Chest 2 View Result Date: 02/08/2024 CLINICAL DATA:  Shortness of breath EXAM: CHEST - 2 VIEW COMPARISON:  08/11/2021 FINDINGS: Frontal and lateral views of the chest are obtained on 3 images. The cardiac silhouette is unremarkable. No acute airspace disease, effusion, or pneumothorax. No acute bony abnormalities. IMPRESSION: 1. No acute intrathoracic process. Electronically Signed   By: Ozell Daring M.D.   On: 02/08/2024 20:00     Procedures   Medications Ordered in the ED  morphine  (PF) 4 MG/ML injection 4 mg (has no administration in time range)  ondansetron  (ZOFRAN ) injection 4 mg (has no  administration in time range)  methylPREDNISolone  sodium succinate (SOLU-MEDROL ) 125 mg/2 mL injection 125 mg (125 mg Intravenous Given 02/08/24 2007)  ipratropium-albuterol  (DUONEB) 0.5-2.5 (3) MG/3ML nebulizer solution 3 mL (3 mLs Nebulization Given 02/08/24 2009)  ipratropium-albuterol  (DUONEB) 0.5-2.5 (3) MG/3ML nebulizer solution 3 mL (3 mLs Nebulization Given 02/08/24 2009)  ipratropium-albuterol  (DUONEB) 0.5-2.5 (3) MG/3ML nebulizer solution 3 mL (3 mLs Nebulization Given 02/08/24 2009)  azithromycin  (ZITHROMAX ) tablet 500 mg (500 mg Oral Given 02/08/24 2008)  lactated ringers  bolus 1,000 mL (1,000 mLs Intravenous New Bag/Given 02/08/24 2040)                                    Medical Decision Making 49 year old female with past medical history of reactive airway disease presenting to the emergency department today with cough and shortness of breath.  I will further evaluate the patient here with basic labs to evaluate for anemia.  Will obtain a chest x-ray to evaluate for Neri edema, pulmonary infiltrates, or pneumothorax.  The patient is having significant wheezing heard on  exam.  Suspect this is likely an exacerbation of her reactive airway disease.  I will give her Solu-Medrol  as well as DuoNebs here and azithromycin .  Will also obtain an RSV/COVID/flu swab to evaluate for viral etiologies.  I will reevaluate for ultimate disposition.  With the patient having the significant wheezing here on exam and with her history I suspect this is likely more due to reactive airway disease and pulmonary embolism at this time.  On reassessment patient reports that her breathing is better but is not complaining of abdominal pain.  She initially did not complain of this but states that she has been having lower abdominal pain now over the past few days.  States that it is worse now.  LFTs as well as a lipase and urinalysis are added.  CT scan is ordered.  Morphine  and Zofran  is also ordered.  Plan is for reevaluation after the patient's workup is complete for ultimate disposition.  Her calcium is very mildly elevated so do not think that this is causing her symptoms.  Plan is for reevaluation after above workup.  CT scan intermittent of labs are pending at time of signout.  Amount and/or Complexity of Data Reviewed Labs: ordered. Radiology: ordered.  Risk Prescription drug management.        Final diagnoses:  Exacerbation of asthma, unspecified asthma severity, unspecified whether persistent  Abdominal pain, unspecified abdominal location    ED Discharge Orders          Ordered    predniSONE  (DELTASONE ) 50 MG tablet        02/08/24 2043    azithromycin  (ZITHROMAX ) 250 MG tablet  Daily        02/08/24 2043    albuterol  (VENTOLIN  HFA) 108 (90 Base) MCG/ACT inhaler  Every 4 hours PRN        02/08/24 2043               Ula Prentice SAUNDERS, MD 02/08/24 2044  "

## 2024-02-08 NOTE — ED Provider Notes (Signed)
 This is a 49 year old female who is here with wheezing and shortness of breath.  Found to have likely COPD exacerbation.  Had x-ray and CT imaging that was concerning for possible atypical bronchopneumonia.  On my reassessment in the evening, the patient appears to be breathing much more comfortably, no audible wheezing.  She has no hypoxia.  She has a mild tachypnea which may be related to albuterol  treatment.  Her labs are otherwise unremarkable.  She is feeling better and wanted to go home.  I have added on Rocephin  to complete antibiotic treatment for likely community pneumonia.  She was already prescribed azithromycin  as well as steroids and we will give her an albuterol  pump to go home with.  She is comfortable discharge at this time.   Cottie Donnice PARAS, MD 02/08/24 2219

## 2024-02-08 NOTE — Discharge Instructions (Addendum)
 Please take the steroids and use 2 puffs of the albuterol  every 4 hours.  Take the antibiotic as prescribed.  Follow-up with your doctor.  Return to the ER for worsening symptoms.
